# Patient Record
Sex: Female | Born: 1950
Health system: Southern US, Community
[De-identification: ages and names within clinical notes are randomized; demographics above are authoritative.]

## PROBLEM LIST (undated history)

## (undated) DIAGNOSIS — Z9889 Other specified postprocedural states: Secondary | ICD-10-CM

## (undated) DIAGNOSIS — T7840XA Allergy, unspecified, initial encounter: Secondary | ICD-10-CM

## (undated) DIAGNOSIS — E669 Obesity, unspecified: Secondary | ICD-10-CM

## (undated) DIAGNOSIS — S0300XA Dislocation of jaw, unspecified side, initial encounter: Secondary | ICD-10-CM

## (undated) DIAGNOSIS — K219 Gastro-esophageal reflux disease without esophagitis: Secondary | ICD-10-CM

## (undated) DIAGNOSIS — M1990 Unspecified osteoarthritis, unspecified site: Secondary | ICD-10-CM

## (undated) DIAGNOSIS — E785 Hyperlipidemia, unspecified: Secondary | ICD-10-CM

## (undated) DIAGNOSIS — M549 Dorsalgia, unspecified: Secondary | ICD-10-CM

## (undated) DIAGNOSIS — R51 Headache: Secondary | ICD-10-CM

## (undated) DIAGNOSIS — K635 Polyp of colon: Secondary | ICD-10-CM

## (undated) DIAGNOSIS — E119 Type 2 diabetes mellitus without complications: Secondary | ICD-10-CM

## (undated) HISTORY — DX: Obesity, unspecified: E66.9

## (undated) HISTORY — DX: Type 2 diabetes mellitus without complications: E11.9

## (undated) HISTORY — DX: Unspecified osteoarthritis, unspecified site: M19.90

## (undated) HISTORY — DX: Gastro-esophageal reflux disease without esophagitis: K21.9

## (undated) HISTORY — PX: TUBAL LIGATION: SHX77

## (undated) HISTORY — DX: Dorsalgia, unspecified: M54.9

## (undated) HISTORY — DX: Hyperlipidemia, unspecified: E78.5

## (undated) HISTORY — DX: Allergy, unspecified, initial encounter: T78.40XA

## (undated) HISTORY — DX: Other specified postprocedural states: Z98.890

## (undated) HISTORY — PX: ABDOMINAL HYSTERECTOMY: SHX81

---
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
## 1983-12-26 HISTORY — PX: TOTAL ABDOMINAL HYSTERECTOMY: SHX209

## 1986-12-25 HISTORY — PX: OTHER SURGICAL HISTORY: SHX169

## 1998-06-17 ENCOUNTER — Ambulatory Visit (HOSPITAL_COMMUNITY): Admission: RE | Admit: 1998-06-17 | Discharge: 1998-06-17 | Payer: Self-pay | Admitting: *Deleted

## 1998-10-11 ENCOUNTER — Other Ambulatory Visit: Admission: RE | Admit: 1998-10-11 | Discharge: 1998-10-11 | Payer: Self-pay | Admitting: *Deleted

## 1999-01-06 ENCOUNTER — Encounter: Admission: RE | Admit: 1999-01-06 | Discharge: 1999-01-25 | Payer: Self-pay | Admitting: Family Medicine

## 1999-05-02 ENCOUNTER — Encounter: Admission: RE | Admit: 1999-05-02 | Discharge: 1999-07-31 | Payer: Self-pay | Admitting: Family Medicine

## 2000-03-26 ENCOUNTER — Other Ambulatory Visit: Admission: RE | Admit: 2000-03-26 | Discharge: 2000-03-26 | Payer: Self-pay | Admitting: Family Medicine

## 2000-10-25 ENCOUNTER — Encounter: Payer: Self-pay | Admitting: Family Medicine

## 2000-10-25 ENCOUNTER — Ambulatory Visit: Admission: RE | Admit: 2000-10-25 | Discharge: 2000-10-25 | Payer: Self-pay | Admitting: Family Medicine

## 2001-08-09 ENCOUNTER — Other Ambulatory Visit: Admission: RE | Admit: 2001-08-09 | Discharge: 2001-08-09 | Payer: Self-pay | Admitting: Family Medicine

## 2003-12-31 ENCOUNTER — Other Ambulatory Visit: Admission: RE | Admit: 2003-12-31 | Discharge: 2003-12-31 | Payer: Self-pay | Admitting: Family Medicine

## 2004-02-29 ENCOUNTER — Emergency Department (HOSPITAL_COMMUNITY): Admission: EM | Admit: 2004-02-29 | Discharge: 2004-02-29 | Payer: Self-pay | Admitting: Emergency Medicine

## 2004-07-05 ENCOUNTER — Ambulatory Visit (HOSPITAL_COMMUNITY): Admission: RE | Admit: 2004-07-05 | Discharge: 2004-07-05 | Payer: Self-pay | Admitting: Family Medicine

## 2004-07-19 ENCOUNTER — Ambulatory Visit (HOSPITAL_COMMUNITY): Admission: RE | Admit: 2004-07-19 | Discharge: 2004-07-19 | Payer: Self-pay | Admitting: Family Medicine

## 2004-07-21 ENCOUNTER — Ambulatory Visit (HOSPITAL_COMMUNITY): Admission: RE | Admit: 2004-07-21 | Discharge: 2004-07-21 | Payer: Self-pay | Admitting: Internal Medicine

## 2004-09-14 ENCOUNTER — Emergency Department (HOSPITAL_COMMUNITY): Admission: EM | Admit: 2004-09-14 | Discharge: 2004-09-14 | Payer: Self-pay | Admitting: *Deleted

## 2004-10-06 ENCOUNTER — Ambulatory Visit (HOSPITAL_COMMUNITY): Admission: RE | Admit: 2004-10-06 | Discharge: 2004-10-06 | Payer: Self-pay | Admitting: Family Medicine

## 2005-01-04 ENCOUNTER — Ambulatory Visit: Payer: Self-pay | Admitting: Family Medicine

## 2005-07-20 ENCOUNTER — Ambulatory Visit (HOSPITAL_COMMUNITY): Admission: RE | Admit: 2005-07-20 | Discharge: 2005-07-20 | Payer: Self-pay | Admitting: Family Medicine

## 2005-08-13 ENCOUNTER — Emergency Department: Payer: Self-pay | Admitting: Internal Medicine

## 2005-11-15 ENCOUNTER — Ambulatory Visit: Payer: Self-pay | Admitting: Family Medicine

## 2005-11-21 ENCOUNTER — Ambulatory Visit (HOSPITAL_COMMUNITY): Admission: RE | Admit: 2005-11-21 | Discharge: 2005-11-21 | Payer: Self-pay | Admitting: Family Medicine

## 2005-12-27 ENCOUNTER — Ambulatory Visit: Payer: Self-pay | Admitting: Family Medicine

## 2006-07-18 ENCOUNTER — Ambulatory Visit: Payer: Self-pay | Admitting: Family Medicine

## 2006-07-24 ENCOUNTER — Ambulatory Visit (HOSPITAL_COMMUNITY): Admission: RE | Admit: 2006-07-24 | Discharge: 2006-07-24 | Payer: Self-pay | Admitting: Family Medicine

## 2006-08-02 ENCOUNTER — Ambulatory Visit (HOSPITAL_COMMUNITY): Admission: RE | Admit: 2006-08-02 | Discharge: 2006-08-02 | Payer: Self-pay | Admitting: *Deleted

## 2006-09-24 DIAGNOSIS — Z9889 Other specified postprocedural states: Secondary | ICD-10-CM

## 2006-09-24 HISTORY — DX: Other specified postprocedural states: Z98.890

## 2006-10-11 ENCOUNTER — Ambulatory Visit: Payer: Self-pay | Admitting: Internal Medicine

## 2006-10-11 ENCOUNTER — Encounter (INDEPENDENT_AMBULATORY_CARE_PROVIDER_SITE_OTHER): Payer: Self-pay | Admitting: *Deleted

## 2006-10-11 ENCOUNTER — Ambulatory Visit (HOSPITAL_COMMUNITY): Admission: RE | Admit: 2006-10-11 | Discharge: 2006-10-11 | Payer: Self-pay | Admitting: Internal Medicine

## 2006-10-11 LAB — HM COLONOSCOPY: HM Colonoscopy: ABNORMAL

## 2006-11-01 ENCOUNTER — Ambulatory Visit: Payer: Self-pay | Admitting: Family Medicine

## 2007-03-04 ENCOUNTER — Ambulatory Visit: Payer: Self-pay | Admitting: Family Medicine

## 2007-03-07 ENCOUNTER — Encounter: Payer: Self-pay | Admitting: Family Medicine

## 2007-03-07 ENCOUNTER — Ambulatory Visit (HOSPITAL_COMMUNITY): Admission: RE | Admit: 2007-03-07 | Discharge: 2007-03-07 | Payer: Self-pay | Admitting: Family Medicine

## 2007-03-07 LAB — CONVERTED CEMR LAB
LDL Cholesterol: 118 mg/dL — ABNORMAL HIGH (ref 0–99)
Total CHOL/HDL Ratio: 3.5
VLDL: 17 mg/dL (ref 0–40)

## 2007-07-26 ENCOUNTER — Encounter: Payer: Self-pay | Admitting: Family Medicine

## 2007-07-26 ENCOUNTER — Ambulatory Visit (HOSPITAL_COMMUNITY): Admission: RE | Admit: 2007-07-26 | Discharge: 2007-07-26 | Payer: Self-pay | Admitting: Family Medicine

## 2007-07-26 LAB — CONVERTED CEMR LAB
BUN: 18 mg/dL (ref 6–23)
Basophils Absolute: 0 10*3/uL (ref 0.0–0.1)
CO2: 26 meq/L (ref 19–32)
Calcium: 9.4 mg/dL (ref 8.4–10.5)
Chloride: 105 meq/L (ref 96–112)
Creatinine, Ser: 0.86 mg/dL (ref 0.40–1.20)
Eosinophils Relative: 3 % (ref 0–5)
Glucose, Bld: 84 mg/dL (ref 70–99)
HCT: 38.6 % (ref 36.0–46.0)
HDL: 57 mg/dL (ref >39)
LDL Cholesterol: 145 mg/dL — ABNORMAL HIGH (ref 0–99)
MCHC: 30.8 g/dL (ref 30.0–36.0)
Neutrophils Relative %: 49 % (ref 43–77)
Platelets: 244 10*3/uL (ref 150–400)
Potassium: 4.5 meq/L (ref 3.5–5.3)
TSH: 1.171 microintl units/mL (ref 0.350–5.50)
Total CHOL/HDL Ratio: 3.9
Triglycerides: 112 mg/dL (ref <150)
VLDL: 22 mg/dL (ref 0–40)

## 2007-07-29 ENCOUNTER — Encounter: Payer: Self-pay | Admitting: Family Medicine

## 2007-07-29 LAB — CONVERTED CEMR LAB
Indirect Bilirubin: 0.5 mg/dL (ref 0.0–0.9)
Total Bilirubin: 0.6 mg/dL (ref 0.3–1.2)

## 2007-07-31 ENCOUNTER — Ambulatory Visit: Payer: Self-pay | Admitting: Family Medicine

## 2007-07-31 ENCOUNTER — Other Ambulatory Visit: Admission: RE | Admit: 2007-07-31 | Discharge: 2007-07-31 | Payer: Self-pay | Admitting: Family Medicine

## 2007-07-31 ENCOUNTER — Encounter: Payer: Self-pay | Admitting: Family Medicine

## 2007-07-31 LAB — CONVERTED CEMR LAB
Ferritin: 227 ng/mL (ref 10–291)
UIBC: 272 ug/dL
Vitamin B-12: 442 pg/mL (ref 211–911)

## 2007-10-02 ENCOUNTER — Ambulatory Visit: Payer: Self-pay | Admitting: Family Medicine

## 2007-10-22 ENCOUNTER — Ambulatory Visit (HOSPITAL_COMMUNITY): Admission: RE | Admit: 2007-10-22 | Discharge: 2007-10-22 | Payer: Self-pay | Admitting: Family Medicine

## 2007-10-30 ENCOUNTER — Ambulatory Visit: Payer: Self-pay | Admitting: Family Medicine

## 2007-12-26 ENCOUNTER — Encounter: Payer: Self-pay | Admitting: Family Medicine

## 2008-01-30 ENCOUNTER — Ambulatory Visit: Payer: Self-pay | Admitting: Family Medicine

## 2008-02-13 ENCOUNTER — Encounter: Payer: Self-pay | Admitting: Family Medicine

## 2008-02-13 LAB — CONVERTED CEMR LAB
ALT: 19 units/L (ref 0–35)
AST: 18 units/L (ref 0–37)
Albumin: 4.2 g/dL (ref 3.5–5.2)
Glucose, Bld: 81 mg/dL (ref 70–99)
HDL: 56 mg/dL (ref >39)
LDL Cholesterol: 152 mg/dL — ABNORMAL HIGH (ref 0–99)
Total CHOL/HDL Ratio: 4.2
Total Protein: 7.6 g/dL (ref 6.0–8.3)
VLDL: 25 mg/dL (ref 0–40)

## 2008-05-13 ENCOUNTER — Ambulatory Visit: Payer: Self-pay | Admitting: Family Medicine

## 2008-05-19 ENCOUNTER — Encounter: Payer: Self-pay | Admitting: Family Medicine

## 2008-05-19 DIAGNOSIS — E785 Hyperlipidemia, unspecified: Secondary | ICD-10-CM

## 2008-05-19 DIAGNOSIS — E669 Obesity, unspecified: Secondary | ICD-10-CM | POA: Insufficient documentation

## 2008-05-19 DIAGNOSIS — K219 Gastro-esophageal reflux disease without esophagitis: Secondary | ICD-10-CM | POA: Insufficient documentation

## 2008-05-19 DIAGNOSIS — E1169 Type 2 diabetes mellitus with other specified complication: Secondary | ICD-10-CM | POA: Insufficient documentation

## 2008-06-05 ENCOUNTER — Encounter: Payer: Self-pay | Admitting: Family Medicine

## 2008-06-05 LAB — CONVERTED CEMR LAB: Retic Ct Pct: 1.1 % (ref 0.4–3.1)

## 2008-08-13 ENCOUNTER — Ambulatory Visit (HOSPITAL_COMMUNITY): Admission: RE | Admit: 2008-08-13 | Discharge: 2008-08-13 | Payer: Self-pay | Admitting: Family Medicine

## 2008-09-02 ENCOUNTER — Ambulatory Visit: Payer: Self-pay | Admitting: Family Medicine

## 2008-09-02 DIAGNOSIS — J301 Allergic rhinitis due to pollen: Secondary | ICD-10-CM | POA: Insufficient documentation

## 2008-09-11 ENCOUNTER — Encounter: Payer: Self-pay | Admitting: Family Medicine

## 2008-09-24 LAB — CONVERTED CEMR LAB
ALT: 15 units/L (ref 0–35)
AST: 17 units/L (ref 0–37)
Albumin: 4.1 g/dL (ref 3.5–5.2)
BUN: 15 mg/dL (ref 6–23)
Bilirubin, Direct: 0.1 mg/dL (ref 0.0–0.3)
CO2: 26 meq/L (ref 19–32)
HDL: 60 mg/dL (ref >39)
Hemoglobin: 11.8 g/dL — ABNORMAL LOW (ref 12.0–15.0)
MCV: 74.6 fL — ABNORMAL LOW (ref 78.0–100.0)
Platelets: 289 10*3/uL (ref 150–400)
Potassium: 4.4 meq/L (ref 3.5–5.3)
Sodium: 142 meq/L (ref 135–145)
TSH: 1.414 microintl units/mL (ref 0.350–4.50)
Total Bilirubin: 0.7 mg/dL (ref 0.3–1.2)
Total Protein: 7.2 g/dL (ref 6.0–8.3)
Triglycerides: 65 mg/dL (ref <150)

## 2008-12-02 ENCOUNTER — Other Ambulatory Visit: Admission: RE | Admit: 2008-12-02 | Discharge: 2008-12-02 | Payer: Self-pay | Admitting: Family Medicine

## 2008-12-02 ENCOUNTER — Ambulatory Visit: Payer: Self-pay | Admitting: Family Medicine

## 2008-12-02 ENCOUNTER — Encounter: Payer: Self-pay | Admitting: Family Medicine

## 2008-12-02 DIAGNOSIS — R5383 Other fatigue: Secondary | ICD-10-CM

## 2008-12-02 DIAGNOSIS — R5381 Other malaise: Secondary | ICD-10-CM | POA: Insufficient documentation

## 2008-12-02 DIAGNOSIS — M79609 Pain in unspecified limb: Secondary | ICD-10-CM | POA: Insufficient documentation

## 2008-12-02 DIAGNOSIS — M109 Gout, unspecified: Secondary | ICD-10-CM | POA: Insufficient documentation

## 2008-12-02 LAB — CONVERTED CEMR LAB: OCCULT 1: NEGATIVE

## 2008-12-03 ENCOUNTER — Telehealth: Payer: Self-pay | Admitting: Family Medicine

## 2008-12-03 LAB — CONVERTED CEMR LAB
Hemoglobin: 11.9 g/dL — ABNORMAL LOW (ref 12.0–15.0)
MCHC: 30.5 g/dL (ref 30.0–36.0)
RDW: 15.3 % (ref 11.5–15.5)
Uric Acid, Serum: 5.5 mg/dL (ref 2.4–7.0)
WBC: 10 10*3/uL (ref 4.0–10.5)

## 2008-12-04 ENCOUNTER — Telehealth: Payer: Self-pay | Admitting: Family Medicine

## 2009-01-08 ENCOUNTER — Encounter: Payer: Self-pay | Admitting: Family Medicine

## 2009-01-08 LAB — CONVERTED CEMR LAB
ALT: 16 units/L (ref 0–35)
Albumin: 4 g/dL (ref 3.5–5.2)
Bilirubin, Direct: 0.1 mg/dL (ref 0.0–0.3)
Cholesterol: 183 mg/dL (ref 0–200)
LDL Cholesterol: 107 mg/dL — ABNORMAL HIGH (ref 0–99)
Total Bilirubin: 0.6 mg/dL (ref 0.3–1.2)
Total Protein: 7 g/dL (ref 6.0–8.3)
VLDL: 16 mg/dL (ref 0–40)

## 2009-04-14 ENCOUNTER — Ambulatory Visit: Payer: Self-pay | Admitting: Family Medicine

## 2009-06-09 LAB — CONVERTED CEMR LAB
AST: 17 units/L (ref 0–37)
Albumin: 4.1 g/dL (ref 3.5–5.2)
Indirect Bilirubin: 0.4 mg/dL (ref 0.0–0.9)
LDL Cholesterol: 129 mg/dL — ABNORMAL HIGH (ref 0–99)
Total Bilirubin: 0.5 mg/dL (ref 0.3–1.2)
Triglycerides: 91 mg/dL (ref <150)
VLDL: 18 mg/dL (ref 0–40)

## 2009-06-11 ENCOUNTER — Encounter: Payer: Self-pay | Admitting: Family Medicine

## 2009-09-08 ENCOUNTER — Telehealth: Payer: Self-pay | Admitting: Family Medicine

## 2009-09-09 ENCOUNTER — Telehealth: Payer: Self-pay | Admitting: Family Medicine

## 2009-09-09 ENCOUNTER — Ambulatory Visit: Payer: Self-pay | Admitting: Family Medicine

## 2009-09-09 DIAGNOSIS — J0100 Acute maxillary sinusitis, unspecified: Secondary | ICD-10-CM | POA: Insufficient documentation

## 2009-09-19 DIAGNOSIS — J209 Acute bronchitis, unspecified: Secondary | ICD-10-CM | POA: Insufficient documentation

## 2009-10-19 ENCOUNTER — Ambulatory Visit (HOSPITAL_COMMUNITY): Admission: RE | Admit: 2009-10-19 | Discharge: 2009-10-19 | Payer: Self-pay | Admitting: Family Medicine

## 2009-10-26 ENCOUNTER — Encounter: Payer: Self-pay | Admitting: Family Medicine

## 2009-11-01 LAB — CONVERTED CEMR LAB
ALT: 14 units/L (ref 0–35)
AST: 13 units/L (ref 0–37)
Albumin: 4 g/dL (ref 3.5–5.2)
BUN: 20 mg/dL (ref 6–23)
Basophils Absolute: 0 10*3/uL (ref 0.0–0.1)
Basophils Relative: 0 % (ref 0–1)
Bilirubin, Direct: 0.1 mg/dL (ref 0.0–0.3)
Calcium: 9.3 mg/dL (ref 8.4–10.5)
Creatinine, Ser: 0.82 mg/dL (ref 0.40–1.20)
Eosinophils Absolute: 0.2 10*3/uL (ref 0.0–0.7)
HCT: 37.3 % (ref 36.0–46.0)
HDL: 63 mg/dL (ref >39)
Indirect Bilirubin: 0.3 mg/dL (ref 0.0–0.9)
LDL Cholesterol: 138 mg/dL — ABNORMAL HIGH (ref 0–99)
Lymphocytes Relative: 36 % (ref 12–46)
Monocytes Absolute: 0.5 10*3/uL (ref 0.1–1.0)
Neutro Abs: 5 10*3/uL (ref 1.7–7.7)
Neutrophils Relative %: 57 % (ref 43–77)
RDW: 16 % — ABNORMAL HIGH (ref 11.5–15.5)
Triglycerides: 80 mg/dL (ref <150)
VLDL: 16 mg/dL (ref 0–40)

## 2009-11-02 ENCOUNTER — Telehealth: Payer: Self-pay | Admitting: Family Medicine

## 2010-01-06 ENCOUNTER — Other Ambulatory Visit: Admission: RE | Admit: 2010-01-06 | Discharge: 2010-01-06 | Payer: Self-pay | Admitting: Family Medicine

## 2010-01-06 ENCOUNTER — Ambulatory Visit: Payer: Self-pay | Admitting: Family Medicine

## 2010-01-06 LAB — CONVERTED CEMR LAB: OCCULT 1: NEGATIVE

## 2010-01-31 ENCOUNTER — Encounter: Payer: Self-pay | Admitting: Family Medicine

## 2010-05-16 ENCOUNTER — Ambulatory Visit: Payer: Self-pay | Admitting: Family Medicine

## 2010-08-03 ENCOUNTER — Telehealth: Payer: Self-pay | Admitting: Family Medicine

## 2010-09-22 ENCOUNTER — Encounter: Payer: Self-pay | Admitting: Family Medicine

## 2010-09-27 ENCOUNTER — Ambulatory Visit: Payer: Self-pay | Admitting: Family Medicine

## 2010-09-27 DIAGNOSIS — R1319 Other dysphagia: Secondary | ICD-10-CM | POA: Insufficient documentation

## 2010-09-27 DIAGNOSIS — E049 Nontoxic goiter, unspecified: Secondary | ICD-10-CM | POA: Insufficient documentation

## 2010-09-27 LAB — CONVERTED CEMR LAB
Basophils Absolute: 0.1 10*3/uL (ref 0.0–0.1)
Cholesterol: 220 mg/dL — ABNORMAL HIGH (ref 0–200)
Eosinophils Relative: 3 % (ref 0–5)
Hemoglobin: 11.5 g/dL — ABNORMAL LOW (ref 12.0–15.0)
LDL Cholesterol: 139 mg/dL — ABNORMAL HIGH (ref 0–99)
Monocytes Absolute: 0.5 10*3/uL (ref 0.1–1.0)
Monocytes Relative: 6 % (ref 3–12)
Neutrophils Relative %: 53 % (ref 43–77)
RDW: 15.5 % (ref 11.5–15.5)
Total CHOL/HDL Ratio: 3.7
Triglycerides: 103 mg/dL (ref <150)
VLDL: 21 mg/dL (ref 0–40)
WBC: 9.2 10*3/uL (ref 4.0–10.5)

## 2010-09-28 ENCOUNTER — Encounter: Payer: Self-pay | Admitting: Family Medicine

## 2010-09-29 ENCOUNTER — Encounter: Payer: Self-pay | Admitting: Family Medicine

## 2010-10-05 LAB — CONVERTED CEMR LAB
BUN: 16 mg/dL (ref 6–23)
Calcium: 9.6 mg/dL (ref 8.4–10.5)
Chloride: 101 meq/L (ref 96–112)
Glucose, Bld: 81 mg/dL (ref 70–99)
Potassium: 4.8 meq/L (ref 3.5–5.3)

## 2010-10-07 ENCOUNTER — Ambulatory Visit (HOSPITAL_COMMUNITY): Admission: RE | Admit: 2010-10-07 | Discharge: 2010-10-07 | Payer: Self-pay | Admitting: Family Medicine

## 2010-10-17 ENCOUNTER — Encounter: Payer: Self-pay | Admitting: Family Medicine

## 2010-10-21 ENCOUNTER — Ambulatory Visit (HOSPITAL_COMMUNITY): Admission: RE | Admit: 2010-10-21 | Discharge: 2010-10-21 | Payer: Self-pay | Admitting: Family Medicine

## 2010-10-28 ENCOUNTER — Encounter: Payer: Self-pay | Admitting: Family Medicine

## 2010-11-02 ENCOUNTER — Encounter
Admission: RE | Admit: 2010-11-02 | Discharge: 2010-11-02 | Payer: Self-pay | Source: Home / Self Care | Admitting: Family Medicine

## 2011-01-06 ENCOUNTER — Ambulatory Visit (HOSPITAL_COMMUNITY)
Admission: RE | Admit: 2011-01-06 | Discharge: 2011-01-06 | Payer: Self-pay | Source: Home / Self Care | Attending: Family Medicine | Admitting: Family Medicine

## 2011-01-06 ENCOUNTER — Encounter: Payer: Self-pay | Admitting: Family Medicine

## 2011-01-06 ENCOUNTER — Ambulatory Visit
Admission: RE | Admit: 2011-01-06 | Discharge: 2011-01-06 | Payer: Self-pay | Source: Home / Self Care | Attending: Family Medicine | Admitting: Family Medicine

## 2011-01-06 DIAGNOSIS — N3000 Acute cystitis without hematuria: Secondary | ICD-10-CM | POA: Insufficient documentation

## 2011-01-06 DIAGNOSIS — E1169 Type 2 diabetes mellitus with other specified complication: Secondary | ICD-10-CM | POA: Insufficient documentation

## 2011-01-06 DIAGNOSIS — M542 Cervicalgia: Secondary | ICD-10-CM | POA: Insufficient documentation

## 2011-01-06 DIAGNOSIS — M1710 Unilateral primary osteoarthritis, unspecified knee: Secondary | ICD-10-CM | POA: Insufficient documentation

## 2011-01-06 DIAGNOSIS — M1990 Unspecified osteoarthritis, unspecified site: Secondary | ICD-10-CM | POA: Insufficient documentation

## 2011-01-06 DIAGNOSIS — R109 Unspecified abdominal pain: Secondary | ICD-10-CM | POA: Insufficient documentation

## 2011-01-06 LAB — CONVERTED CEMR LAB
Ketones, urine, test strip: NEGATIVE
Specific Gravity, Urine: 1.02
Urobilinogen, UA: 0.2
WBC Urine, dipstick: NEGATIVE
pH: 6

## 2011-01-09 ENCOUNTER — Ambulatory Visit: Admit: 2011-01-09 | Payer: Self-pay | Admitting: Family Medicine

## 2011-01-09 LAB — CONVERTED CEMR LAB
Basophils Absolute: 0 10*3/uL (ref 0.0–0.1)
Basophils Relative: 1 % (ref 0–1)
Chloride: 103 meq/L (ref 96–112)
Cholesterol: 218 mg/dL — ABNORMAL HIGH (ref 0–200)
Creatinine, Ser: 0.76 mg/dL (ref 0.40–1.20)
Eosinophils Absolute: 0.2 10*3/uL (ref 0.0–0.7)
Eosinophils Relative: 3 % (ref 0–5)
HDL: 60 mg/dL (ref >39)
Lymphocytes Relative: 43 % (ref 12–46)
RDW: 15.5 % (ref 11.5–15.5)
Sodium: 140 meq/L (ref 135–145)
Triglycerides: 100 mg/dL (ref <150)
WBC: 7 10*3/uL (ref 4.0–10.5)

## 2011-01-12 ENCOUNTER — Telehealth (INDEPENDENT_AMBULATORY_CARE_PROVIDER_SITE_OTHER): Payer: Self-pay | Admitting: *Deleted

## 2011-01-12 ENCOUNTER — Ambulatory Visit (HOSPITAL_COMMUNITY)
Admission: RE | Admit: 2011-01-12 | Discharge: 2011-01-12 | Payer: Self-pay | Source: Home / Self Care | Attending: Family Medicine | Admitting: Family Medicine

## 2011-01-13 ENCOUNTER — Encounter: Payer: Self-pay | Admitting: Family Medicine

## 2011-01-14 ENCOUNTER — Encounter: Payer: Self-pay | Admitting: Family Medicine

## 2011-01-16 ENCOUNTER — Ambulatory Visit (HOSPITAL_COMMUNITY)
Admission: RE | Admit: 2011-01-16 | Discharge: 2011-01-16 | Payer: Self-pay | Source: Home / Self Care | Attending: Family Medicine | Admitting: Family Medicine

## 2011-01-17 ENCOUNTER — Encounter: Payer: Self-pay | Admitting: Family Medicine

## 2011-01-23 ENCOUNTER — Encounter: Payer: Self-pay | Admitting: Family Medicine

## 2011-01-24 ENCOUNTER — Encounter: Payer: Self-pay | Admitting: Family Medicine

## 2011-01-25 ENCOUNTER — Encounter: Payer: Self-pay | Admitting: Family Medicine

## 2011-01-26 NOTE — Letter (Signed)
Summary: Letter  Letter   Imported By: Lind Guest 02/03/2010 13:38:52  _____________________________________________________________________  External Attachment:    Type:   Image     Comment:   External Document

## 2011-01-26 NOTE — Letter (Signed)
Summary: handicapp card  handicapp card   Imported By: Lind Guest 01/06/2011 11:46:16  _____________________________________________________________________  External Attachment:    Type:   Image     Comment:   External Document

## 2011-01-26 NOTE — Assessment & Plan Note (Signed)
Summary: follow up   Vital Signs:  Patient profile:   60 year old female Menstrual status:  hysterectomy Height:      64 inches Weight:      179.25 pounds BMI:     30.88 O2 Sat:      98 % on Room air Pulse rate:   98 / minute Pulse rhythm:   regular Resp:     16 per minute BP sitting:   120 / 80  (left arm)  Vitals Entered By: Adella Hare LPN (September 27, 2010 2:27 PM)  Nutrition Counseling: Patient's BMI is greater than 25 and therefore counseled on weight management options.  O2 Flow:  Room air CC: follow-up visit Is Patient Diabetic? No Pain Assessment Patient in pain? no        Primary Care Provider:  Syliva Overman MD  CC:  follow-up visit.  History of Present Illness: Reports  that she had been oing well, up until the past week, when she developed symptoms suggestive of something "stuck in her throat"Denies diffivulty breathing, states her swallowing is affected. She denies gERD symptoms but has a h/o gERD She also reports a cyst on her thyroid in the past. She is here to review recent labs also. She is not and does not intend to take lipid lowering med,she states she is motivated to change her diet to improve lipids and weight. Denies recent fever or chills. Denies sinus pressure, nasal congestion , ear pain or sore throat. Denies chest congestion, or cough productive of sputum. Denies chest pain, palpitations, PND, orthopnea or leg swelling. Denies abdominal pain, nausea, vomitting, diarrhea or constipation. Denies change in bowel movements or bloody stool. Denies dysuria , frequency, incontinence or hesitancy. Denies  joint pain, swelling, or reduced mobility. Denies headaches, vertigo, seizures. Denies depression, anxiety or insomnia. Denies  rash, lesions, or itch.     Current Medications (verified): 1)  None  Allergies (verified): 1)  ! * Vitamin D 28413 Units  Review of Systems      See HPI Eyes:  Denies blurring and discharge. ENT:   Complains of difficulty swallowing. Psych:  Denies anxiety and depression. Endo:  Denies cold intolerance, excessive thirst, excessive urination, and heat intolerance. Heme:  Denies abnormal bruising and bleeding. Allergy:  Complains of seasonal allergies; controlled on current meds, .  Physical Exam  General:  Well-developed,well-nourished,in no acute distress; alert,appropriate and cooperative throughout examination HEENT: No facial asymmetry, goiter EOMI, No sinus tenderness, TM's Clear, oropharynx  pink and moist.   Chest: Clear to auscultation bilaterally.  CVS: S1, S2, No murmurs, No S3.   Abd: Soft, Nontender.  MS: Adequate ROM spine, hips, shoulders and knees.  Ext: No edema.   CNS: CN 2-12 intact, power tone and sensation normal throughout.   Skin: Intact, no visible lesions or rashes.  Psych: Good eye contact, normal affect.  Memory intact, not anxious or depressed appearing.    Impression & Recommendations:  Problem # 1:  GOITER (ICD-240.9) Assessment Comment Only  Orders: Radiology Referral (Radiology)  Problem # 2:  OTHER DYSPHAGIA (KGM-010.27) Assessment: Deteriorated  Orders: Gastroenterology Referral (GI)  Problem # 3:  ALLERGIC RHINITIS, SEASONAL (ICD-477.0) Assessment: Unchanged  Problem # 4:  OBESITY (ICD-278.00)  Ht: 64 (09/27/2010)   Wt: 179.25 (09/27/2010)   BMI: 30.88 (09/27/2010) therapeutic lifestyle change discussed and encouraged  Problem # 5:  HYPERLIPIDEMIA (ICD-272.4) Assessment: Deteriorated  Labs Reviewed: SGOT: 13 (10/19/2009)   SGPT: 14 (10/19/2009)   HDL:60 (09/22/2010), 48 (01/26/2010)  LDL:139 (09/22/2010), 129 (01/26/2010)  Chol:220 (09/22/2010), 193 (01/26/2010)  Trig:103 (09/22/2010), 80 (01/26/2010) Low fat diet discussed and encouraged, and literature also given  Other Orders: Influenza Vaccine NON MCR (04540)  Patient Instructions: 1)  cPE jan 14 or after. 2)  It is important that you exercise regularly at least 20  minutes 5 times a week. If you develop chest pain, have severe difficulty breathing, or feel very tired , stop exercising immediately and seek medical attention. 3)  You need to lose weight. Consider a lower calorie diet and regular exercise. goal is 6 pounds. 4)  you are being referred for an [place] of your thyroid gland ,and also to see Dr Renae Fickle 5)  pls follow low fat and low carb diet.    Influenza Vaccine    Vaccine Type: Fluvax Non-MCR    Site: left deltoid    Mfr: [email or web address]    Dose: 0.5 ml    Route: IM    Given by: Adella Hare LPN    Exp. Date: 05/212    Lot #: [id number]    VIS given: 07/19/10 version given September 27, 2010.

## 2011-01-26 NOTE — Assessment & Plan Note (Signed)
Summary: cough   Vital Signs:  Patient profile:   60 year old female Menstrual status:  hysterectomy Height:      64 inches Weight:      179 pounds BMI:     30.84 O2 Sat:      99 % Pulse rate:   70 / minute Pulse rhythm:   regular Resp:     16 per minute BP sitting:   112 / 80  (left arm)  Vitals Entered By: Everitt Amber LPN (January 06, 2011 10:56 AM)  Nutrition Counseling: Patient's BMI is greater than 25 and therefore counseled on weight management options. CC: has a pain that started in her right side that works its way to the front, hurts very bad when it hits, going on off and on for a few weeks, Also has a cough thats producing green phlegm   Primary Care Provider:  Syliva Overman MD  CC:  has a pain that started in her right side that works its way to the front, hurts very bad when it hits, going on off and on for a few weeks, and Also has a cough thats producing green phlegm.  History of Present Illness: c/o left loin to groin intermittently x 3 weeks. C/o cough productive of green sputum, chills and fatigue.and sinus pressure wih green drainage. C/O neck pain radiaitng to RUE , with numness and weakness progressing, present for about 1 yr. Has established disc diseasse in lower back for which she is requesting renewed handicap sticker. c/o weight gain, wants adipex back  Current Medications (verified): 1)  Flonase 50 Mcg/act Susp (Fluticasone Propionate) .... 2 Sprays in Each Nostril Daily  Allergies (verified): 1)  ! * Vitamin D 21308 Units  Review of Systems      See HPI General:  Complains of chills, fatigue, and fever. Eyes:  Denies blurring and double vision. ENT:  Complains of sinus pressure; bilateral  maxillary pressure with green drainage anterior and posterior, chills intermittent, no doc fever x 1 week, dry coucough. CV:  Denies chest pain or discomfort, palpitations, and swelling of feet. Resp:  Complains of cough and sputum productive. GI:   Complains of abdominal pain; right flank pain iontermittently x 2 weeksnot aggravated by movement, at times up to a 10, radiates t groin. GU:  Denies dysuria and urinary frequency. MS:  Complains of joint pain and stiffness; bilateral knee pain and instability x 2 months, right is currently more painful and swollen, hiowever the left uckled approx 3 weeks ago and she actually fell and bruised the left hand.. Neuro:  Complains of numbness, tingling, and weakness; intermittent weakness, tinglung and numbness of RUE from neck to  wrist x 6 months, progressively worsenessing, c/o neck tightness,No other neurologic symptoms.  Physical Exam  General:  Well-developed,well-nourished,in no acute distress; alert,appropriate and cooperative throughout examination. Ill appearing HEENT: No facial asymmetry,  EOMI, maxillarysinus tenderness, TM's Clear, oropharynx  pink and moist. right trapezius spasm  Chest: decreased air entry with bilateral crackles, no wheezes CVS: S1, S2, No murmurs, No S3.   Abd: Soft, Nontender.  MV:HQIONGEXB  ROM cervical spine, hips, shoulders and knees.  Ext: No edema.   CNS: CN 2-12 intact, decreased power and sensation in right upper extremity  Skin: Intact, no visible lesions or rashes.  Psych: Good eye contact, normal affect.  Memory intact, not anxious or depressed appearing.    Impression & Recommendations:  Problem # 1:  DEGENERATIVE JOINT DISEASE, KNEES, BILATERAL (ICD-715.96) Assessment Deteriorated  Her updated medication list for this problem includes:    Ibuprofen 800 Mg Tabs (Ibuprofen) .Marland Kitchen... Take 1 tablet by mouth three times a day for 1 week, then one daily as needed for pain    Cyclobenzaprine Hcl 10 Mg Tabs (Cyclobenzaprine hcl) .Marland Kitchen... Take 1 tab by mouth at bedtime for 1 week, then as needed for neck spasm  Future Orders: Orthopedic Referral (Ortho) ... 01/10/2011  Problem # 2:  NECK PAIN, CHRONIC (ICD-723.1) Assessment: Deteriorated  Her updated  medication list for this problem includes:    Ibuprofen 800 Mg Tabs (Ibuprofen) .Marland Kitchen... Take 1 tablet by mouth three times a day for 1 week, then one daily as needed for pain    Cyclobenzaprine Hcl 10 Mg Tabs (Cyclobenzaprine hcl) .Marland Kitchen... Take 1 tab by mouth at bedtime for 1 week, then as needed for neck spasm pt has right upper ext weakneess and numkbness and has lower back ds Orders: Radiology Referral (Radiology)  Problem # 3:  FLANK PAIN (ICD-789.09) Assessment: Comment Only  Her updated medication list for this problem includes:    Ibuprofen 800 Mg Tabs (Ibuprofen) .Marland Kitchen... Take 1 tablet by mouth three times a day for 1 week, then one daily as needed for pain    Cyclobenzaprine Hcl 10 Mg Tabs (Cyclobenzaprine hcl) .Marland Kitchen... Take 1 tab by mouth at bedtime for 1 week, then as needed for neck spasm  Orders: Radiology other (Radiology Other)concerned re possibility of kidney stones UA Dipstick W/ Micro (manual) (81191) Depo- Medrol 80mg  (J1040) Ketorolac-Toradol 15mg  (Y7829) Admin of Therapeutic Inj  intramuscular or subcutaneous (96372)Future Orders: Radiology Referral (Radiology) ... 01/12/2011  Problem # 4:  ACUTE CYSTITIS (ICD-595.0) Assessment: Comment Only  Her updated medication list for this problem includes:    Septra Ds 800-160 Mg Tabs (Sulfamethoxazole-trimethoprim) .Marland Kitchen... Take 1 tablet by mouth two times a day  Encouraged to push clear liquids, get enough rest, and take acetaminophen as needed. To be seen in 10 days if no improvement, sooner if worse. CCUA checked in the office was negative for infection or blood  Problem # 5:  ACUTE MAXILLARY SINUSITIS (ICD-461.0) Assessment: Comment Only  Her updated medication list for this problem includes:    Flonase 50 Mcg/act Susp (Fluticasone propionate) .Marland Kitchen... 2 sprays in each nostril daily    Septra Ds 800-160 Mg Tabs (Sulfamethoxazole-trimethoprim) .Marland Kitchen... Take 1 tablet by mouth two times a day  Problem # 6:  ACUTE BRONCHITIS  (ICD-466.0) Assessment: Comment Only  Her updated medication list for this problem includes:    Septra Ds 800-160 Mg Tabs (Sulfamethoxazole-trimethoprim) .Marland Kitchen... Take 1 tablet by mouth two times a day  Problem # 7:  OBESITY (ICD-278.00) Assessment: Deteriorated  Ht: 64 (01/06/2011)   Wt: 179 (01/06/2011)   BMI: 30.84 (01/06/2011) therapeutic lifestyle change discussed and encouraged  Problem # 8:  HYPERLIPIDEMIA (ICD-272.4) Assessment: Comment Only  Orders: T-Lipid Profile ([id number]) Low fat dietdiscussed and encouraged  Labs Reviewed: SGOT: 13 (10/19/2009)   SGPT: 14 (10/19/2009)   HDL:60 (09/22/2010), 48 (01/26/2010)  LDL:139 (09/22/2010), 129 (01/26/2010)  Chol:220 (09/22/2010), 193 (01/26/2010)  Trig:103 (09/22/2010), 80 (01/26/2010)  Complete Medication List: 1)  Flonase 50 Mcg/act Susp (Fluticasone propionate) .... 2 sprays in each nostril daily 2)  Septra Ds 800-160 Mg Tabs (Sulfamethoxazole-trimethoprim) .... Take 1 tablet by mouth two times a day 3)  Fluconazole 150 Mg Tabs (Fluconazole) .... Take 1 tablet by mouth once a day as needed for vaginal itching 4)  Phentermine Hcl 37.5 Mg Tabs (Phentermine hcl) .... Take  1 tablet by mouth once a day 5)  Ibuprofen 800 Mg Tabs (Ibuprofen) .... Take 1 tablet by mouth three times a day for 1 week, then one daily as needed for pain 6)  Cyclobenzaprine Hcl 10 Mg Tabs (Cyclobenzaprine hcl) .... Take 1 tab by mouth at bedtime for 1 week, then as needed for neck spasm  Other Orders: T-Basic Metabolic Panel [phone]) T-CBC w/Diff [phone]) T- Hemoglobin A1C (32440-10272)  Patient Instructions: 1)  Please schedule a follow-up appointment in 2 months. 2)  It is important that you exercise regularly at least 20 minutes 5 times a week. If you develop chest pain, have severe difficulty breathing, or feel very tired , stop exercising immediately and seek medical attention. 3)  You need to lose weight. Consider a lower calorie diet  and regular exercise.  4)  You are being trreated for sinusitis.Meds are sent in. 5)  You will  be referred to ortho, DrMurphy about your knees. 6)  you need imaging done of your kidneys.to eval flank pain. 7)  You neeed an MRI of your neck for eval of neck and right upper extremity  pain. Meds are sent in for this. 8)  phentermine is sent in as an apetite suppressant, pls take half tablet once daily, Not one daily the lowest effective doose is preferred 9)  You will be referred for an xray ofyour abdomen and an [place] of your kidneys to eval pain Prescriptions: CYCLOBENZAPRINE HCL 10 MG TABS (CYCLOBENZAPRINE HCL) Take 1 tab by mouth at bedtime for 1 week, then as needed for neck spasm  #30 x 1   Entered and Authorized by:   Syliva Overman MD   Signed by:   Syliva Overman MD on 01/06/2011   Method used:   Electronically to        [facility] (retail)       [address]       [place], [place]  [address]       Ph: [phone]       Fax: [phone]   RxID:   [id number] IBUPROFEN 800 MG TABS (IBUPROFEN) Take 1 tablet by mouth three times a day for 1 week, then one daily as needed for pain  #42 x 0   Entered and Authorized by:   Syliva Overman MD   Signed by:   Syliva Overman MD on 01/06/2011   Method used:   Electronically to        [facility] (retail)       [address]       [place], [place]  [address]       Ph: [phone]       Fax: [phone]   RxID:   [id number] PHENTERMINE HCL 37.5 MG TABS (PHENTERMINE HCL) Take 1 tablet by mouth once a day  #30 x 0   Entered and Authorized by:   Syliva Overman MD   Signed by:   Syliva Overman MD on 01/06/2011   Method used:   [facility]       [address]       [place], [place]  [address]       Ph: [phone]       Fax: [phone]   RxID:  [id number] FLUCONAZOLE 150 MG  TABS (FLUCONAZOLE) Take 1 tablet by mouth once a day as needed for vaginal itching  #3 x 0   Entered and Authorized by:   Syliva Overman MD   Signed by:   Syliva Overman MD on 01/06/2011   Method used:   Electronically to        [facility] (retail)       [address]       [place], [place]  [address]       Ph: [phone]       Fax: [phone]   RxID:   [id number] SEPTRA DS 800-160 MG TABS (SULFAMETHOXAZOLE-TRIMETHOPRIM) Take 1 tablet by mouth two times a day  #28 x 0   Entered and Authorized by:   Syliva Overman MD   Signed by:   Syliva Overman MD on 01/06/2011   Method used:   Electronically to        [facility] (retail)       [address]       [place], [place]  [address]       Ph: [phone]       Fax: [phone]   RxID:   [id number]    Medication Administration  Injection # 1:    Medication: Depo- Medrol 80mg     Diagnosis: FLANK PAIN (ICD-789.09)    Route: IM    Site: RUOQ gluteus    Exp Date: 06/2011    Lot #: [id number]    Mfr: Pharmacia    Comments: 80mg  given     Patient tolerated injection without complications    Given by: Everitt Amber LPN (January 06, 2011 11:52 AM)  Injection # 2:    Medication: Ketorolac-Toradol 15mg     Diagnosis: FLANK PAIN (ICD-789.09)    Route: IM    Site: LUOQ gluteus    Exp Date: 05/2012    Lot #: [id number]     Mfr: [email or web address]    Comments: 60mg  given     Patient tolerated injection without complications    Given by: Everitt Amber LPN (January 06, 2011 11:52 AM)  Orders Added: 1)  Est. Patient Level IV [32355] 2)  Radiology Referral [Radiology] 3)  Radiology other [Radiology Other] 4)  T-Basic Metabolic Panel [80048-22910] 5)  T-Lipid Profile [80061-22930] 6)  T-CBC w/Diff [73220-25427] 7)  T- Hemoglobin A1C [83036-23375] 8)  UA Dipstick W/ Micro (manual) [81000] 9)  Depo- Medrol 80mg  [J1040] 10)  Ketorolac-Toradol 15mg  [J1885] 11)  Admin of  Therapeutic Inj  intramuscular or subcutaneous [96372] 12)  Orthopedic Referral [Ortho] 13)  Radiology Referral [Radiology]    Laboratory Results   Urine Tests    Routine Urinalysis   Color: yellow Appearance: Clear Glucose: negative   (Normal Range: Negative) Bilirubin: negative   (Normal Range: Negative) Ketone: negative   (Normal Range: Negative) Spec. Gravity: 1.020   (Normal Range: 1.003-1.035) Blood: negative   (Normal Range: Negative) pH: 6.0   (Normal Range: 5.0-8.0) Protein: negative   (Normal Range: Negative) Urobilinogen: 0.2   (Normal Range: 0-1) Nitrite: negative   (Normal Range: Negative) Leukocyte Esterace: negative   (Normal Range: Negative)         Medication Administration  Injection # 1:    Medication: Depo- Medrol 80mg     Diagnosis: FLANK PAIN (ICD-789.09)    Route: IM    Site: RUOQ gluteus    Exp Date:  06/2011    Lot #: [id number]    Mfr: Pharmacia    Comments: 80mg  given     Patient tolerated injection without complications    Given by: Everitt Amber LPN (January 06, 2011 11:52 AM)  Injection # 2:    Medication: Ketorolac-Toradol 15mg     Diagnosis: FLANK PAIN (ICD-789.09)    Route: IM    Site: LUOQ gluteus    Exp Date: 05/2012    Lot #: [id number]     Mfr: [email or web address]    Comments: 60mg  given     Patient tolerated injection without complications    Given by: Everitt Amber LPN (January 06, 2011 11:52 AM)  Orders Added: 1)  Est. Patient Level IV [42595] 2)  Radiology Referral [Radiology] 3)  Radiology other [Radiology Other] 4)  T-Basic Metabolic Panel [80048-22910] 5)  T-Lipid Profile [80061-22930] 6)  T-CBC w/Diff [63875-64332] 7)  T- Hemoglobin A1C [83036-23375] 8)  UA Dipstick W/ Micro (manual) [81000] 9)  Depo- Medrol 80mg  [J1040] 10)  Ketorolac-Toradol 15mg  [J1885] 11)  Admin of Therapeutic Inj  intramuscular or subcutaneous [96372] 12)  Orthopedic Referral [Ortho] 13)  Radiology Referral [Radiology]

## 2011-01-26 NOTE — Letter (Signed)
Summary: Letter  Letter   Imported By: Lind Guest 09/29/2010 15:47:26  _____________________________________________________________________  External Attachment:    Type:   Image     Comment:   External Document

## 2011-01-26 NOTE — Letter (Signed)
Summary: Letter  Letter   Imported By: Lind Guest 01/17/2011 10:03:13  _____________________________________________________________________  External Attachment:    Type:   Image     Comment:   External Document

## 2011-01-26 NOTE — Progress Notes (Signed)
Summary: x-ray  Phone Note Call from Patient   Summary of Call: fell down step this morning and her ankle is swollen and she can't hardley walk.   [phone]     wants to get a x-ray Initial call taken by: Rudene Anda,  August 03, 2010 4:59 PM  Follow-up for Phone Call        returned call, no answer Follow-up by: Adella Hare LPN,  August 03, 2010 5:06 PM  Additional Follow-up for Phone Call Additional follow up Details #1::        please have patient schedule appt with PA  Additional Follow-up by: Adella Hare LPN,  August 03, 2010 5:07 PM    Additional Follow-up for Phone Call Additional follow up Details #2::    called pt and left a message for pt to call office and schedule appt with PA.  Follow-up by: Rudene Anda,  August 04, 2010 9:02 AM

## 2011-01-26 NOTE — Letter (Signed)
Summary: office notes  office notes   Imported By: Lind Guest 05/06/2010 16:20:20  _____________________________________________________________________  External Attachment:    Type:   Image     Comment:   External Document

## 2011-01-26 NOTE — Miscellaneous (Signed)
  Clinical Lists Changes  Orders: Added new Referral order of Neurosurgeon Referral (Neurosurgeon) - Signed

## 2011-01-26 NOTE — Letter (Signed)
Summary: demo  demo   Imported By: Lind Guest 05/06/2010 16:15:53  _____________________________________________________________________  External Attachment:    Type:   Image     Comment:   External Document

## 2011-01-26 NOTE — Letter (Signed)
Summary: misc  misc   Imported By: Lind Guest 05/06/2010 16:26:29  _____________________________________________________________________  External Attachment:    Type:   Image     Comment:   External Document

## 2011-01-26 NOTE — Letter (Signed)
Summary: labs  labs   Imported By: Lind Guest 05/06/2010 16:21:16  _____________________________________________________________________  External Attachment:    Type:   Image     Comment:   External Document

## 2011-01-26 NOTE — Letter (Signed)
Summary: Letter  Letter   Imported By: Lind Guest 09/28/2010 13:55:51  _____________________________________________________________________  External Attachment:    Type:   Image     Comment:   External Document

## 2011-01-26 NOTE — Letter (Signed)
Summary: lab add on   lab add on   Imported By: Luann Bullins 10/17/2010 14:12:29  _____________________________________________________________________  External Attachment:    Type:   Image     Comment:   External Document

## 2011-01-26 NOTE — Letter (Signed)
Summary: history and physical  history and physical   Imported By: Lind Guest 05/06/2010 16:16:34  _____________________________________________________________________  External Attachment:    Type:   Image     Comment:   External Document

## 2011-01-26 NOTE — Assessment & Plan Note (Signed)
Summary: office visit   Vital Signs:  Patient profile:   60 year old female Menstrual status:  hysterectomy Height:      64 inches Weight:      178 pounds BMI:     30.66 O2 Sat:      95 % Pulse rate:   78 / minute Pulse rhythm:   regular Resp:     16 per minute BP sitting:   118 / 78  (left arm) Cuff size:   large  Vitals Entered By: Everitt Amber LPN (May 16, 2010 2:51 PM)  Nutrition Counseling: Patient's BMI is greater than 25 and therefore counseled on weight management options. CC: Follow up chronic problems   Primary Care Provider:  Syliva Overman MD  CC:  Follow up chronic problems.  History of Present Illness: Ftaigue , pt stastes the formui;lation in her "bee pollen' tabs changed , and they became ineffective, she stopped losing and actually gained weight. She recently started a new tablet and is hopeful, formulartion "change" in the same bee polln. She still does not exercise. She denies depression , anxiety or insomnia. She denies fever or chills.She denies head or chest congestion.  Current Medications (verified): 1)  Aspir-Low 81 Mg Tbec (Aspirin) .... One Tab By Mouth Once Daily 2)  Flonase 50 Mcg/act Susp (Fluticasone Propionate) .... Two Puffs Per Nostril Once Daily  Allergies (verified): 1)  ! * Vitamin D 16109 Units  Review of Systems      See HPI Eyes:  Denies discharge and red eye. ENT:  Denies hoarseness, nasal congestion, sinus pressure, and sore throat. CV:  Denies chest pain or discomfort, palpitations, and swelling of feet. Resp:  Denies coughing up blood and sputum productive. GU:  Denies dysuria and urinary frequency. MS:  Denies joint pain, low back pain, mid back pain, and stiffness. Derm:  Denies itching, lesion(s), and rash. Neuro:  Denies headaches, memory loss, seizures, and tremors. Psych:  Denies anxiety and depression. Endo:  Denies excessive thirst and excessive urination. Heme:  Denies abnormal bruising and bleeding. Allergy:   Denies hives or rash and itching eyes.  Physical Exam  General:  Well-developed,well-nourished,in no acute distress; alert,appropriate and cooperative throughout examination HEENT: No facial asymmetry,  EOMI, No sinus tenderness, TM's Clear, oropharynx  pink and moist.   Chest: Clear to auscultation bilaterally.  CVS: S1, S2, No murmurs, No S3.   Abd: Soft, Nontender.  MS: Adequate ROM spine, hips, shoulders and knees.  Ext: No edema.   CNS: CN 2-12 intact, power tone and sensation normal throughout.   Skin: Intact, no visible lesions or rashes.  Psych: Good eye contact, normal affect.  Memory intact, not anxious or depressed appearing.    Impression & Recommendations:  Problem # 1:  OBESITY (ICD-278.00) Assessment Deteriorated  Ht: 64 (05/16/2010)   Wt: 178 (05/16/2010)   BMI: 30.66 (05/16/2010), encourage dpt strongly to consider phentermine instead of beepollen formulation which is not regulated  Problem # 2:  HYPERLIPIDEMIA (ICD-272.4) Assessment: Deteriorated  Labs Reviewed: SGOT: 13 (10/19/2009)   SGPT: 14 (10/19/2009)   HDL:48 (01/26/2010), 63 (10/19/2009)  LDL:129 (01/26/2010), 138 (10/19/2009)  Chol:193 (01/26/2010), 217 (10/19/2009)  Trig:80 (01/26/2010), 80 (10/19/2009), low fat diet discusssed and encouraged  Complete Medication List: 1)  Aspir-low 81 Mg Tbec (Aspirin) .... One tab by mouth once daily 2)  Flonase 50 Mcg/act Susp (Fluticasone propionate) .... Two puffs per nostril once daily  Other Orders: T-Lipid Profile ([id number]) T-CBC w/Diff [phone]) T-Vitamin D (25-Hydroxy) [id number])  Patient Instructions: 1)  Please schedule a follow-up appointment in 4 months. 2)  It is important that you exercise regularly at least 30 minutes 5 times a week. If you develop chest pain, have severe difficulty breathing, or feel very tired , stop exercising immediately and seek medical attention. 3)  You need to lose weight. Consider a lower calorie diet and  regular exercise.  4)  Vitamin D level in 4 months, and fasting lipid and CBc. 5)  Plas follow a low fat diet, 12 to `1600 cal/day. 6)  Pls start OTC ONE multivit daily. 7)  Asprin 81 mg daily for heart protection. 8)  OTC calcium 1200mg  daily caplsule, with Vitamin D1000IU one daily Prescriptions: FLONASE 50 MCG/ACT SUSP (FLUTICASONE PROPIONATE) two puffs per nostril once daily  #1 x 3   Entered by:   Everitt Amber LPN   Authorized by:   Syliva Overman MD   Signed by:   Everitt Amber LPN on 16/09/9603   Method used:   Electronically to        [facility] (retail)       [address]       [place], [place]  [address]       Ph: [phone]       Fax: [phone]   RxID:   [id number]

## 2011-01-26 NOTE — Letter (Signed)
Summary: phone notes  phone notes   Imported By: Lind Guest 05/06/2010 16:24:13  _____________________________________________________________________  External Attachment:    Type:   Image     Comment:   External Document

## 2011-01-26 NOTE — Letter (Signed)
Summary: lab add on  lab add on   Imported By: Luann Bullins 09/28/2010 11:07:52  _____________________________________________________________________  External Attachment:    Type:   Image     Comment:   External Document

## 2011-01-26 NOTE — Letter (Signed)
Summary: x rays  x rays   Imported By: Lind Guest 05/06/2010 16:23:13  _____________________________________________________________________  External Attachment:    Type:   Image     Comment:   External Document

## 2011-01-26 NOTE — Progress Notes (Signed)
Summary: referral  Phone Note Call from Patient   Summary of Call: pt came in office today and stated she had wrong time for ultra sound at [facility]. I looked in notes and told her that it was documented for 01/12/2009 10:30. Radiology stated 1:30. I called Radiology and tried to get them to work with patient and even got them to ask radiology. Radiology stated they couldn't until 12:30. And patient said she needed to go back to work. So she decided she would go this afternoon for appt.  Initial call taken by: Rudene Anda,  January 12, 2011 10:58 AM

## 2011-01-26 NOTE — Assessment & Plan Note (Signed)
Summary: physical   Vital Signs:  Patient profile:   60 year old female Menstrual status:  hysterectomy Height:      64 inches Weight:      173.25 pounds BMI:     29.85 O2 Sat:      99 % on Room air Pulse rate:   98 / minute Pulse rhythm:   regular Resp:     16 per minute BP sitting:   120 / 80  (left arm)  Vitals Entered By: Worthy Keeler LPN (January 06, 2010 2:51 PM)  Nutrition Counseling: Patient's BMI is greater than 25 and therefore counseled on weight management options.  O2 Flow:  Room air CC: physical Is Patient Diabetic? No Pain Assessment Patient in pain? no       Vision Screening:Left eye with correction: 20 / 20 Right eye with correction: 20 / 20 Both eyes with correction: 20 / 15        Vision Entered By: Worthy Keeler LPN (January 06, 2010 2:51 PM)   Primary Care Provider:  Syliva Overman MD  CC:  physical.  History of Present Illness: Reports  thatshe has been  doing well. Denies recent fever or chills. Denies sinus pressure, nasal congestion , ear pain or sore throat. Denies chest congestion, or cough productive of sputum. Denies chest pain, palpitations, PND, orthopnea or leg swelling. Denies abdominal pain, nausea, vomitting, diarrhea or constipation. Denies change in bowel movements or bloody stool. Denies dysuria , frequency, incontinence or hesitancy. Denies  joint pain, swelling, or reduced mobility. Denies headaches, vertigo, seizures. Denies depression, anxiety or insomnia. Denies  rash, lesions, or itch. The pt is still not exercising, however she recently , in the past 3 months started an oTC apetite supprssant called "Bee pollen" with veryencouraging results, she does however take 3 instead of the max recommended 2 tabs daily.States she has not felt this good in a long time, energized, very happy with her weight loss and new slimmer image.     Current Medications (verified): 1)  None  Allergies (verified): No Known Drug  Allergies  Review of Systems      See HPI General:  Complains of loss of appetite and weight loss; denies chills, fatigue, fever, malaise, sleep disorder, sweats, and weakness; intentional. Eyes:  Denies blurring, discharge, double vision, eye irritation, eye pain, halos, itching, and light sensitivity. ENT:  Denies hoarseness, nasal congestion, sinus pressure, and sore throat. CV:  Denies chest pain or discomfort, difficulty breathing while lying down, palpitations, shortness of breath with exertion, and swelling of feet. Resp:  Denies cough, sputum productive, and wheezing. GI:  Denies abdominal pain, constipation, diarrhea, nausea, and vomiting. GU:  Denies dysuria and urinary frequency. MS:  Denies joint pain, low back pain, mid back pain, and stiffness. Derm:  Denies itching, lesion(s), and rash. Neuro:  Denies headaches, poor balance, seizures, and sensation of room spinning. Psych:  Denies anxiety, depression, suicidal thoughts/plans, thoughts of violence, and unusual visions or sounds. Endo:  Denies cold intolerance, excessive hunger, excessive thirst, excessive urination, heat intolerance, polyuria, and weight change. Heme:  Denies abnormal bruising, bleeding, and enlarge lymph nodes. Allergy:  Complains of seasonal allergies; denies hives or rash and itching eyes.  Physical Exam  General:  Well-developed,well-nourished,in no acute distress; alert,appropriate and cooperative throughout examination Head:  Normocephalic and atraumatic without obvious abnormalities. No apparent alopecia or balding. Eyes:  No corneal or conjunctival inflammation noted. EOMI. Perrla. Funduscopic exam benign, without hemorrhages, exudates or papilledema. Vision grossly normal.  Ears:  External ear exam shows no significant lesions or deformities.  Otoscopic examination reveals clear canals, tympanic membranes are intact bilaterally without bulging, retraction, inflammation or discharge. Hearing is grossly  normal bilaterally. Nose:  External nasal examination shows no deformity or inflammation. Nasal mucosa are pink and moist without lesions or exudates. Mouth:  Oral mucosa and oropharynx without lesions or exudates.  Teeth in good repair. Neck:  No deformities, masses, or tenderness noted. Chest Wall:  No deformities, masses, or tenderness noted. Breasts:  No mass, nodules, thickening, tenderness, bulging, retraction, inflamation, nipple discharge or skin changes noted.   Lungs:  Normal respiratory effort, chest expands symmetrically. Lungs are clear to auscultation, no crackles or wheezes. Heart:  Normal rate and regular rhythm. S1 and S2 normal without gallop, murmur, click, rub or other extra sounds. Abdomen:  Bowel sounds positive,abdomen soft and non-tender without masses, organomegaly or hernias noted. Rectal:  No external abnormalities noted. Normal sphincter tone. No rectal masses or tenderness.Guaic negative stool Genitalia:  normal introitus, no external lesions, mucosa pink and moist, no vaginal atrophy, and no adnexal masses or tenderness.Uterus absent.   Msk:  No deformity or scoliosis noted of thoracic or lumbar spine.   Pulses:  R and L carotid,radial,femoral,dorsalis pedis and posterior tibial pulses are full and equal bilaterally Extremities:  No clubbing, cyanosis, edema, or deformity noted with normal full range of motion of all joints.   Neurologic:  No cranial nerve deficits noted. Station and gait are normal. Plantar reflexes are down-going bilaterally. DTRs are symmetrical throughout. Sensory, motor and coordinative functions appear intact. Skin:  Intact without suspicious lesions or rashes Cervical Nodes:  No lymphadenopathy noted Axillary Nodes:  No palpable lymphadenopathy Inguinal Nodes:  No significant adenopathy Psych:  Cognition and judgment appear intact. Alert and cooperative with normal attention span and concentration. No apparent delusions, illusions,  hallucinations   Impression & Recommendations:  Problem # 1:  FATIGUE (ICD-780.79) Assessment Improved  Problem # 2:  ALLERGIC RHINITIS, SEASONAL (ICD-477.0) Assessment: Unchanged  Problem # 3:  GERD (ICD-530.81) Assessment: Improved  Problem # 4:  HYPERLIPIDEMIA (ICD-272.4) Assessment: Comment Only  The following medications were removed from the medication list:    Lovastatin 40 Mg Tabs (Lovastatin) ..... One tab by mouth qhs  Orders: T-Lipid Profile [phone])  Labs Reviewed: SGOT: 13 (10/19/2009)   SGPT: 14 (10/19/2009)   HDL:63 (10/19/2009), 52 (06/04/2009)  LDL:138 (10/19/2009), 129 (06/04/2009)  Chol:217 (10/19/2009), 199 (06/04/2009)  Trig:80 (10/19/2009), 91 (06/04/2009)  Problem # 5:  OBESITY (ICD-278.00) Assessment: Improved  Ht: 64 (01/06/2010)   Wt: 173.25 (01/06/2010)   BMI: 29.85 (01/06/2010)  Other Orders: T-CBC w/Diff ([id number]) T-Anemia Panel 3  (2904) T-Vitamin D (25-Hydroxy) (29562-13086) Pap Smear (57846) Hemoccult Guaiac-1 spec.(in office) (96295)  Patient Instructions: 1)  Please schedule a follow-up appointment in 4 months. 2)  Lipid Panel prior to visit, ICD-9: 3)  Fasting blood sugarn  fasting asap 4)  Vit D level 5)  CBC and anemia panel 6)  Congrats on your weight loss, pls keep it up 7)  Exercise every day for 30 mins    Laboratory Results  Date/Time Received: January 06, 2010  Date/Time Reported: January 06, 2010   Stool - Occult Blood Hemmoccult #1: negative Date: 01/06/2010 Comments: 51180 9R 8/11 118 10/12

## 2011-01-26 NOTE — Letter (Signed)
[facility]: [facility]   Imported By: Lind Guest 10/28/2010 10:25:50  _____________________________________________________________________  External Attachment:    Type:   Image     Comment:   External Document

## 2011-02-01 NOTE — Letter (Signed)
[facility]   Imported By: Lind Guest 01/25/2011 11:31:35  _____________________________________________________________________  External Attachment:    Type:   Image     Comment:   External Document

## 2011-02-08 ENCOUNTER — Telehealth: Payer: Self-pay | Admitting: Family Medicine

## 2011-02-09 NOTE — Letter (Signed)
Summary: [facility]   Imported By: Lind Guest 01/30/2011 13:54:14  _____________________________________________________________________  External Attachment:    Type:   Image     Comment:   External Document

## 2011-02-15 NOTE — Progress Notes (Signed)
Summary: about her sister  Phone Note Call from Patient   Summary of Call: Marybel stated that Reathy did make it to her house and that she is dong all she can do for now but she will not  put her in a hospital yet. She may take her to [facility] or [place] where Dr.Bell is at. And that you could call her later at home if you want to. But this is the worse she has ever seen her. Initial call taken by: Lind Guest,  February 08, 2011 4:18 PM  Follow-up for Phone Call        noted Follow-up by: Syliva Overman MD,  February 08, 2011 5:22 PM

## 2011-03-09 ENCOUNTER — Encounter: Payer: Self-pay | Admitting: Family Medicine

## 2011-03-13 ENCOUNTER — Encounter (INDEPENDENT_AMBULATORY_CARE_PROVIDER_SITE_OTHER): Payer: BC Managed Care – PPO | Admitting: Family Medicine

## 2011-03-13 ENCOUNTER — Other Ambulatory Visit (HOSPITAL_COMMUNITY)
Admission: RE | Admit: 2011-03-13 | Discharge: 2011-03-13 | Disposition: A | Discharge status: Home / Self Care | Payer: BC Managed Care – PPO | Source: Ambulatory Visit | Attending: Family Medicine | Admitting: Family Medicine

## 2011-03-13 ENCOUNTER — Other Ambulatory Visit: Payer: Self-pay | Admitting: Family Medicine

## 2011-03-13 ENCOUNTER — Encounter: Payer: Self-pay | Admitting: Family Medicine

## 2011-03-13 DIAGNOSIS — Z124 Encounter for screening for malignant neoplasm of cervix: Secondary | ICD-10-CM

## 2011-03-13 DIAGNOSIS — Z1211 Encounter for screening for malignant neoplasm of colon: Secondary | ICD-10-CM

## 2011-03-13 DIAGNOSIS — Z01419 Encounter for gynecological examination (general) (routine) without abnormal findings: Secondary | ICD-10-CM | POA: Insufficient documentation

## 2011-03-13 DIAGNOSIS — Z0000 Encounter for general adult medical examination without abnormal findings: Secondary | ICD-10-CM

## 2011-03-13 LAB — CONVERTED CEMR LAB: OCCULT 1: NEGATIVE

## 2011-03-14 ENCOUNTER — Encounter: Payer: Self-pay | Admitting: Family Medicine

## 2011-03-14 LAB — CONVERTED CEMR LAB
BUN: 13 mg/dL (ref 6–23)
Chloride: 102 meq/L (ref 96–112)
Creatinine, Ser: 0.7 mg/dL (ref 0.40–1.20)
Hgb A1c MFr Bld: 6.3 % — ABNORMAL HIGH (ref <5.7)
Potassium: 3.9 meq/L (ref 3.5–5.3)

## 2011-03-16 ENCOUNTER — Encounter: Payer: Self-pay | Admitting: *Deleted

## 2011-03-23 NOTE — Assessment & Plan Note (Signed)
Summary: physical   Vital Signs:  Patient profile:   60 year old female Menstrual status:  hysterectomy Height:      64 inches Weight:      175 pounds BMI:     30.15 O2 Sat:      100 % Pulse rate:   73 / minute Pulse rhythm:   regular Resp:     16 per minute BP sitting:   124 / 84  (left arm) Cuff size:   large  Vitals Entered By: Everitt Amber LPN (March 13, 2011 1:19 PM)  Nutrition Counseling: Patient's BMI is greater than 25 and therefore counseled on weight management options. CC: CPE  Vision Screening:Left eye with correction: 20 / 15 Right eye with correction: 20 / 15 Both eyes with correction: 20 / 15  Color vision testing: normal      Vision Entered By: Everitt Amber LPN (March 13, 2011 1:25 PM)   Primary Care Provider:  Syliva Overman MD  CC:  CPE.  History of Present Illness: Reports  that she has been fairly well. Denies recent fever or chills. Denies sinus pressure, nasal congestion , ear pain or sore throat.Last week had nasal burning and discomfort, has been unable to find her flonase and thinks this is the problem. Denies chest congestion, or cough productive of sputum. Denies chest pain, palpitations, PND, orthopnea or leg swelling. Denies abdominal pain, nausea, vomitting, diarrhea or constipation. Denies change in bowel movements or bloody stool. Denies dysuria , frequency, incontinence or hesitancy.  Denies headaches, vertigo, seizures. Denies depression, anxiety or insomnia. Denies  rash, lesions, or itch. Trying to lose weight , disappointed at slow rate , wants inc dose ofphentermine.     Current Medications (verified): 1)  Flonase 50 Mcg/act Susp (Fluticasone Propionate) .... 2 Sprays in Each Nostril Daily 2)  Phentermine Hcl 37.5 Mg Tabs (Phentermine Hcl) .... Take 1 Tablet By Mouth Once A Day  Allergies (verified): 1)  ! * Vitamin D 16109 Units  Review of Systems      See HPI Eyes:  Denies discharge, eye pain, and red eye. MS:   Complains of low back pain; acute low back pain radiating down last week, no specific aggravating factor noted, improving with anti-inflammatories and muscle relaxants. Endo:  Denies cold intolerance, excessive hunger, excessive thirst, excessive urination, and heat intolerance. Heme:  Denies abnormal bruising and bleeding. Allergy:  Complains of seasonal allergies; denies hives or rash and itching eyes; increasedaymptoms.  Physical Exam  General:  Well-developed,well-nourished,in no acute distress; alert,appropriate and cooperative throughout examination Head:  Normocephalic and atraumatic without obvious abnormalities. No apparent alopecia or balding. Eyes:  No corneal or conjunctival inflammation noted. EOMI. Perrla. Funduscopic exam benign, without hemorrhages, exudates or papilledema. Vision grossly normal. Ears:  External ear exam shows no significant lesions or deformities.  Otoscopic examination reveals bilateral cerumen , right grtr than left. Hearing is grossly normal bilaterally. Nose:  External nasal examination shows no deformity or inflammation. Nasal mucosa are pink and moist without lesions or exudates.Very narrow nasal canal Mouth:  Oral mucosa and oropharynx without lesions or exudates.  Teeth in good repair. Neck:  No deformities, masses, or tenderness noted. Chest Wall:  No deformities, masses, or tenderness noted. Breasts:  No mass, nodules, thickening, tenderness, bulging, retraction, inflamation, nipple discharge or skin changes noted.   Lungs:  Normal respiratory effort, chest expands symmetrically. Lungs are clear to auscultation, no crackles or wheezes. Heart:  Normal rate and regular rhythm. S1 and S2 normal  without gallop, murmur, click, rub or other extra sounds. Abdomen:  Bowel sounds positive,abdomen soft and non-tender without masses, organomegaly or hernias noted. Rectal:  No external abnormalities noted. Normal sphincter tone. No rectal masses or  tenderness. Genitalia:  Normal introitus for age, no external lesions, no vaginal discharge, mucosa pink and moist, no vaginal or cervical lesions, no vaginal atrophy, no friaility or hemorrhage,uterus absent, no adnexal masses or tenderness Msk:  No deformity or scoliosis noted of thoracic or lumbar spine.   Pulses:  R and L carotid,radial,femoral,dorsalis pedis and posterior tibial pulses are full and equal bilaterally Extremities:  decreased ROM lumbar spine, normal in shoulders, hips and knees, no edema Neurologic:  No cranial nerve deficits noted. Station and gait are normal. Plantar reflexes are down-going bilaterally. DTRs are symmetrical throughout. Sensory, motor and coordinative functions appear intact. Skin:  Intact without suspicious lesions or rashes Cervical Nodes:  No lymphadenopathy noted Axillary Nodes:  No palpable lymphadenopathy Inguinal Nodes:  No significant adenopathy Psych:  Cognition and judgment appear intact. Alert and cooperative with normal attention span and concentration. No apparent delusions, illusions, hallucinations   Impression & Recommendations:  Problem # 1:  IMPAIRED FASTING GLUCOSE (ICD-790.21) Assessment Deteriorated  Her updated medication list for this problem includes:    Metformin Hcl 500 Mg Tabs (Metformin hcl) .Marland Kitchen... Take 1 tablet by mouth once a day Pt advised to reduce carbohydrate intake, espescially sweets, and to start regular physical activity, at least 30 minutes 5 days weekly, to enable weight loss, and reduce the risk of becoming diabetic   Orders: T- Hemoglobin A1C ([id number])  Problem # 2:  HYPERLIPIDEMIA (ICD-272.4) Assessment: Unchanged  Her updated medication list for this problem includes:    Pravastatin Sodium 40 Mg Tabs (Pravastatin sodium) .Marland Kitchen... Take 1 tab by mouth at bedtime Low fat dietdiscussed and encouraged  Labs Reviewed: SGOT: 13 (10/19/2009)   SGPT: 14 (10/19/2009)   HDL:60 (01/06/2011), 60 (09/22/2010)   LDL:138 (01/06/2011), 139 (09/22/2010)  Chol:218 (01/06/2011), 220 (09/22/2010)  Trig:100 (01/06/2011), 103 (09/22/2010)  Problem # 3:  OBESITY (ICD-278.00) Assessment: Improved  Ht: 64 (03/13/2011)   Wt: 175 (03/13/2011)   BMI: 30.15 (03/13/2011) therapeutic lifestyle change discussed and encouraged  Complete Medication List: 1)  Flonase 50 Mcg/act Susp (Fluticasone propionate) .... 2 sprays in each nostril daily 2)  Phentermine Hcl 37.5 Mg Tabs (Phentermine hcl) .... Take 1 tablet by mouth once a day 3)  Metformin Hcl 500 Mg Tabs (Metformin hcl) .... Take 1 tablet by mouth once a day 4)  Pravastatin Sodium 40 Mg Tabs (Pravastatin sodium) .... Take 1 tab by mouth at bedtime  Other Orders: T-Basic Metabolic Panel [phone]) Hemoccult Guaiac-1 spec.(in office) (82270) Pap Smear (29562)  Patient Instructions: 1)  Please schedule a follow-up appointment in 2 months. 2)  It is important that you exercise regularly at least 20 minutes 5 times a week. If you develop chest pain, have severe difficulty breathing, or feel very tired , stop exercising immediately and seek medical attention. 3)  You need to lose weight. Consider a lower calorie diet and regular exercise. congrats on your 4 pound weight loss. 4)  we will provide a 1200 and a 1500 cal diet sheet. 5)  dose increase on phentermine to oNE daily. 6)  two new meds as discussed. 7)  BMP prior to visit, ICD-9: 8)  HbgA1C prior to visit, ICD-9:  non fasting  Prescriptions: FLONASE 50 MCG/ACT SUSP (FLUTICASONE PROPIONATE) 2 sprays in each nostril daily  #1 x  3   Entered by:   Adella Hare LPN   Authorized by:   Syliva Overman MD   Signed by:   Adella Hare LPN on 16/09/9603   Method used:   Electronically to        [facility] (retail)       [address]       [place], [place]  [address]       Ph: [phone]       Fax: [phone]   RxID:   [id number] PHENTERMINE HCL 37.5 MG TABS  (PHENTERMINE HCL) Take 1 tablet by mouth once a day  #30 x 1   Entered by:   Adella Hare LPN   Authorized by:   Syliva Overman MD   Signed by:   Adella Hare LPN on 10/21/2535   Method used:   Print[facility] then fax[facility] to ...       [facility] ([facility]       [address]       [place], [place]  [address]       Ph: [phone]       Fax: [phone]   RxID:   [id number] PRAVASTATIN SODIUM 40 MG TABS (PRAVASTATIN SODIUM) Take 1 tab by mouth at bedtime  #30 x 3   Entered and Authorized by:   Syliva Overman MD   Signed by:   Syliva Overman MD on 03/13/2011   Method used:   Electronically to        [facility] (retail)       [address]       [place], [place]  [address]       Ph: [phone]       Fax: [phone]   RxID:   [id number] METFORMIN HCL 500 MG TABS (METFORMIN HCL) Take 1 tablet by mouth once a day  #30 x 2   Entered and Authorized by:   Syliva Overman MD   Signed by:   Syliva Overman MD on 03/13/2011   Method used:   Electronically to        [facility] (retail)       [address]       [place], [place]  [address]       Ph: [phone]       Fax: [phone]   RxID:   [id number]    Orders Added: 1)  Est. Patient 40-64 years [99396] 2)  T-Basic Metabolic Panel [phone] 3)  T- Hemoglobin A1C [83036-23375] 4)  Hemoccult Guaiac-1 spec.(in office) [82270] 5)  Pap Smear [88150]    Laboratory Results  Date/Time Received: March 13, 2011 2:32 PM  Date/Time Reported: March 13, 2011 2:32 PM   Stool - Occult Blood Hemmoccult #1: negative Date: 03/13/2011 Comments: 5030 5/14 51301 13L 10/13 Adella Hare LPN  March 13, 2011 2:32 PM

## 2011-03-29 ENCOUNTER — Encounter: Payer: Self-pay | Admitting: Family Medicine

## 2011-04-03 ENCOUNTER — Ambulatory Visit (INDEPENDENT_AMBULATORY_CARE_PROVIDER_SITE_OTHER): Payer: BC Managed Care – PPO | Admitting: Family Medicine

## 2011-04-03 VITALS — BP 110/70 | Wt 175.0 lb

## 2011-04-03 DIAGNOSIS — H6120 Impacted cerumen, unspecified ear: Secondary | ICD-10-CM

## 2011-04-03 NOTE — Progress Notes (Signed)
Only right ear impacted, cleared and checked by dr simpson, patient tolerated well

## 2011-04-08 NOTE — Progress Notes (Signed)
  Subjective:    Patient ID: Stacey Smith, female    DOB: 04-02-1951, 60 y.o.   MRN: [id number]  HPI    Review of Systems     Objective:   Physical Exam        Assessment & Plan:  Succesful irrigation of ear, with no trauma to tympanic membrane

## 2011-05-11 ENCOUNTER — Encounter: Payer: Self-pay | Admitting: Family Medicine

## 2011-05-12 NOTE — Op Note (Signed)
[name], [name]                ACCOUNT NO.:  [id number]   MEDICAL RECORD NO.:  [id number]          PATIENT TYPE:  AMB   LOCATION:  DAY                           FACILITY:  [facility]   PHYSICIAN:  R. Roetta Sessions, M.D. DATE OF BIRTH:  05/01/1951   DATE OF PROCEDURE:  10/11/2006  DATE OF DISCHARGE:                                 OPERATIVE REPORT   PROCEDURE:  Colonoscopy with biopsy.   INDICATIONS FOR PROCEDURE:  The patient is a 60 year old African-American  female sent over through the courtesy of Dr. Syliva Overman for colorectal  cancer screening. She had a standard colonoscopy for average risk screening  back on July 21, 2004. She had a diminutive polyp at 30 cm that was cold  biopsied and turned out to be hyperplastic. The remainder of the colon  appeared normal. However, since that time unfortunately she has 2 sisters  which have been diagnosed with colorectal cancer, both last year; one 1 year  younger than [name] and her older sister age 79 was diagnosed with the  disease. [name] continues to have no lower GI tract symptoms. Dr. Lodema Hong  has sent her over now for repeat high-risk screening colonoscopy. This  approach has been discussed with the patient at length. The potential risks,  benefits, and alternatives have been reviewed, questions answered. She is  agreeable. Please see documentation in the medical record.   MONITORING:  O2 saturation, blood pressure, pulse and respirations were  monitored throughout the entire procedure.   CONSCIOUS SEDATION:  Versed 4 mg IV, Demerol 75 mg IV in divided doses.   INSTRUMENT:  Olympus video chip system.   FINDINGS:  Digital rectal exam revealed no abnormalities.   ENDOSCOPIC FINDINGS:  The prep was good.   RECTUM:  Examination of the rectal mucosa including a retroflexed view of  the anal verge revealed no abnormalities.   COLON:  The colonic mucosa was surveyed from the rectosigmoid junction  through the left  transverse and right colon to the area of the appendiceal  orifice, ileocecal valve and cecum. These structures were well seen and  photographed for the record. From this level, the scope was slowly  withdrawn, all previously mentioned mucosal surfaces were again seen. The  patient had a 3 mm diminutive polyp at the splenic flexure and a second one  in the mid sigmoid. They were both cold biopsied/removed. The remainder of  the colonic mucosa appeared entirely normal. The patient tolerated the  procedure well and was reacted in endoscopy.   IMPRESSION:  Normal rectum, diminutive polyps in the left colon cold  biopsied/removed. The remainder of the colonic mucosa appeared normal.   RECOMMENDATIONS:  1. Rule out on path.  2. Further recommendations to follow.      Jonathon Bellows, M.D.  Electronically Signed     RMR/MEDQ  D:  10/11/2006  T:  10/12/2006  Job:  [id number]   cc:   Milus Mallick. Lodema Hong, M.D.  Fax: [phone]

## 2011-05-12 NOTE — Op Note (Signed)
[name], [name]                          ACCOUNT NO.:  [id number]   MEDICAL RECORD NO.:  [id number]                   PATIENT TYPE:  AMB   LOCATION:  DAY                                  FACILITY:  [facility]   PHYSICIAN:  R. Roetta Sessions, M.D.              DATE OF BIRTH:  [birth year]   DATE OF PROCEDURE:  07/21/2004  DATE OF DISCHARGE:                                 OPERATIVE REPORT   PROCEDURE:  Colonoscopy with biopsy.   INDICATIONS FOR PROCEDURE:  The patient is a 60 year old lady sent over by  the courtesy of Dr. Syliva Overman for colorectal cancer screening.  She  has never had a colonoscopy.  There is no family history of colon cancer,  and Stacey Smith is devoid of any GI symptoms.  Colonoscopy is now being done  as a standard screening maneuver.  This approach has been discussed with the  patient at length.  The potential risks, benefits, and alternatives have  been reviewed and questions answered.  She is agreeable.  Please see the  documentation in the medical record for more information.   PROCEDURE:  O2 saturation, blood pressure, pulses, and respirations were  monitored throughout the entirety of the procedure.  Conscious sedation was  with Versed 3 mg IV, Demerol 75 mg IV in divided doses.  The instrument used  was the Olympus video chip system.   FINDINGS:  Digital rectal examination revealed no abnormalities.   ENDOSCOPIC FINDINGS:  The prep was excellent.   Rectum:  Examination of the rectal mucosa including retroflex view of the  anal verge revealed no abnormalities.   Colon:  The colonic mucosa was surveyed from the rectosigmoid junction  through the left, transverse, right colon to the area of the appendiceal  orifice, ileocecal valve, and cecum.  These structures were well-seen and  photographed for the record.  From this level, the scope was slowly  withdrawn.  All previously mentioned mucosal surfaces were again seen.  The  only abnormality observed was  a 3-mm polyp at 30 cm which was cold  biopsy/removed.  The remainder of the colonic mucosa appeared normal.  The  patient tolerated the procedure well and was reactive in endoscopy.   IMPRESSION:  1. Normal rectum.  2. Diminutive polyp at 30 cm, cold biopsy/removed.  3. The remainder of the colonic mucosa appeared normal.   RECOMMENDATIONS:  1. Follow up on pathology.  2. Further recommendations to follow.      ___________________________________________                                            Jonathon Bellows, M.D.   RMR/MEDQ  D:  07/21/2004  T:  07/21/2004  Job:  [id number]   cc:   Milus Mallick. Lodema Hong, M.D.  [phone]  [address]  [place], [place] [address]  Fax: [phone]

## 2011-05-17 ENCOUNTER — Encounter: Payer: Self-pay | Admitting: Family Medicine

## 2011-05-17 ENCOUNTER — Ambulatory Visit (INDEPENDENT_AMBULATORY_CARE_PROVIDER_SITE_OTHER): Payer: BC Managed Care – PPO | Admitting: Family Medicine

## 2011-05-17 DIAGNOSIS — E669 Obesity, unspecified: Secondary | ICD-10-CM

## 2011-05-17 DIAGNOSIS — R5381 Other malaise: Secondary | ICD-10-CM

## 2011-05-17 DIAGNOSIS — R5383 Other fatigue: Secondary | ICD-10-CM

## 2011-05-17 DIAGNOSIS — E785 Hyperlipidemia, unspecified: Secondary | ICD-10-CM

## 2011-05-17 DIAGNOSIS — R7301 Impaired fasting glucose: Secondary | ICD-10-CM

## 2011-05-17 NOTE — Patient Instructions (Addendum)
F/u in 3 months.  It is important that you exercise regularly at least 30 minutes 5 times a week. If you develop chest pain, have severe difficulty breathing, or feel very tired, stop exercising immediately and seek medical attention    A healthy diet is rich in fruit, vegetables and whole grains. Poultry fish, nuts and beans are a healthy choice for protein rather then red meat. A low sodium diet and drinking 64 ounces of water daily is generally recommended. Oils and sweet should be limited. Carbohydrates especially for those who are diabetic or overweight, should be limited to 34-45 gram per meal. It is important to eat on a regular schedule, at least 3 times daily. Snacks should be primarily fruits, vegetables or nuts.   pls start daily metformin, phentermine  And pravstatin as prescribed.  Call if further problems.  Fasting labs in 3 months

## 2011-05-22 NOTE — Assessment & Plan Note (Signed)
Pt strongly encouraged to take metformin as prescribed, tp delay or prevent dev of DM, her family is diabetic

## 2011-05-22 NOTE — Assessment & Plan Note (Signed)
Deteriorated. Patient re-educated about  the importance of commitment to a  minimum of 150 minutes of exercise per week. The importance of healthy food choices with portion control discussed. Encouraged to start a food diary, count calories and to consider  joining a support group. Sample diet sheets offered. Goals set by the patient for the next several months.    

## 2011-05-22 NOTE — Assessment & Plan Note (Signed)
Unchanged, med compliance stressed esp in light of multiple co-morbidities, also dietary change also

## 2011-05-22 NOTE — Progress Notes (Signed)
  Subjective:    Patient ID: Stacey Smith, female    DOB: [birth year], 60 y.o.   MRN: [id number]  HPI The PT is here for follow up and re-evaluation of chronic medical conditions, medication management and review of any  recent lab and radiology data.  Preventive health is updated, specifically  Cancer screening,  and Immunization.   Reports diareah with metformin and stopped taking it. Reports inc stress and anxiety which she relates to work, but is clearly having relationship issues also       Review of Systems Denies recent fever or chills. Denies sinus pressure, nasal congestion, ear pain or sore throat. Denies chest congestion, productive cough or wheezing. Denies chest pains, palpitations, paroxysmal nocturnal dyspnea, orthopnea and leg swelling Denies abdominal pain, nausea, vomiting,diarrhea or constipation.  Denies rectal bleeding or change in bowel movement. Denies dysuria, frequency, hesitancy or incontinence. Denies joint pain, swelling and limitation in mobility. Denies headaches, seizure, numbness, or tingling. Denies polyuria, polydipsia, or dry mouth Denies skin break down or rash.        Objective:   Physical Exam Patient alert and oriented and in no Cardiopulmonary distress.  HEENT: No facial asymmetry, EOMI, no sinus tenderness, , Oropharynx pink and moist.  Neck supple  Chest: Clear to auscultation bilaterally.  CVS: S1, S2 no murmurs, no S3.  ABD: Soft non tender. Bowel sounds normal.  Ext: No edema  MS: Adequate ROM spine, shoulders, hips and knees.  Skin: Intact, no ulcerations or rash noted.  Psych: Good eye contact, flat  affect. Memory intact  Mildly anxious and r depressed appearing.  CNS: CN 2-12 intact, power, tone and sensation normal throughout.        Assessment & Plan:

## 2011-08-15 ENCOUNTER — Encounter: Payer: Self-pay | Admitting: Family Medicine

## 2011-08-16 ENCOUNTER — Encounter: Payer: Self-pay | Admitting: Family Medicine

## 2011-08-16 ENCOUNTER — Ambulatory Visit (INDEPENDENT_AMBULATORY_CARE_PROVIDER_SITE_OTHER): Payer: BC Managed Care – PPO | Admitting: Family Medicine

## 2011-08-16 VITALS — BP 120/76 | HR 68 | Resp 16 | Ht 63.5 in | Wt 180.1 lb

## 2011-08-16 DIAGNOSIS — E669 Obesity, unspecified: Secondary | ICD-10-CM

## 2011-08-16 DIAGNOSIS — R7301 Impaired fasting glucose: Secondary | ICD-10-CM

## 2011-08-16 DIAGNOSIS — E785 Hyperlipidemia, unspecified: Secondary | ICD-10-CM

## 2011-08-16 DIAGNOSIS — Z1211 Encounter for screening for malignant neoplasm of colon: Secondary | ICD-10-CM

## 2011-08-16 MED ORDER — PHENTERMINE HCL 37.5 MG PO CAPS: 37.5000 mg | ORAL_CAPSULE | ORAL | Status: DC

## 2011-08-16 NOTE — Patient Instructions (Addendum)
F/u in 3 months.  No med changes.  It is important that you exercise regularly at least 30 minutes 5 times a week. If you develop chest pain, have severe difficulty breathing, or feel very tired, stop exercising immediately and seek medical attention    A healthy diet is rich in fruit, vegetables and whole grains. Poultry fish, nuts and beans are a healthy choice for protein rather then red meat. A low sodium diet and drinking 64 ounces of water daily is generally recommended. Oils and sweet should be limited. Carbohydrates especially for those who are diabetic or overweight, should be limited to 30-45 gram per meal. It is important to eat on a regular schedule, at least 3 times daily. Snacks should be primarily fruits, vegetables or nuts.  hBA1c today.  Fasting  Lipid and hBA1C in 3 months, 5 days  Before next viisit  You absolutely need to start eating regularly on a schedule..All  the best... You cAN do this  You are being referred for a colonscopy to Dr Darrick Penna or Jena Gauss

## 2011-08-25 NOTE — Assessment & Plan Note (Signed)
Currently taking metformin, stressed lifestyle change to delay diabetes

## 2011-08-25 NOTE — Assessment & Plan Note (Signed)
Uncontrolled, pt to take lipid lowering agent, now she is prediabetic

## 2011-08-25 NOTE — Assessment & Plan Note (Signed)
Unchanged. Patient re-educated about  the importance of commitment to a  minimum of 150 minutes of exercise per week. The importance of healthy food choices with portion control discussed. Encouraged to start a food diary, count calories and to consider  joining a support group. Sample diet sheets offered. Goals set by the patient for the next several months.    

## 2011-08-25 NOTE — Progress Notes (Signed)
  Subjective:    Patient ID: Stacey Smith, female    DOB: 22-Apr-1951, 60 y.o.   MRN: [id number]  HPI The PT is here for follow up and re-evaluation of chronic medical conditions, medication management and review of any available recent lab and radiology data.  Preventive health is updated, specifically  Cancer screening and Immunization.   Questions or concerns regarding consultations or procedures which the PT has had in the interim are  addressed. The PT denies any adverse reactions to current medications since the last visit.  There are no new concerns.  There are no specific complaints She is frustarted with weight loss efforts, but has done very little  To change habits      Review of Systems Denies recent fever or chills. Denies sinus pressure, nasal congestion, ear pain or sore throat. Denies chest congestion, productive cough or wheezing. Denies chest pains, palpitations and leg swelling Denies abdominal pain, nausea, vomiting,diarrhea or constipation.   Denies dysuria, frequency, hesitancy or incontinence. Denies joint pain, swelling and limitation in mobility. Denies headaches, seizures, numbness, or tingling. Denies depression, anxiety or insomnia. Denies skin break down or rash.        Objective:   Physical Exam Patient alert and oriented and in no cardiopulmonary distress.  HEENT: No facial asymmetry, EOMI, no sinus tenderness,  oropharynx pink and moist.  Neck supple no adenopathy.  Chest: Clear to auscultation bilaterally.  CVS: S1, S2 no murmurs, no S3.  ABD: Soft non tender. Bowel sounds normal.  Ext: No edema  MS: Adequate ROM spine, shoulders, hips and knees.  Skin: Intact, no ulcerations or rash noted.  Psych: Good eye contact, normal affect. Memory intact not anxious or depressed appearing.  CNS: CN 2-12 intact, power, tone and sensation normal throughout.        Assessment & Plan:

## 2011-08-31 ENCOUNTER — Telehealth: Payer: Self-pay

## 2011-08-31 NOTE — Telephone Encounter (Signed)
LMOM for pt to return call in reference to colonoscopy. ( Last colonoscopy was in 09/2006)

## 2011-09-05 NOTE — Telephone Encounter (Signed)
Pt scheduled for an OV with Gerrit Halls, NP on 09/07/2011 @ 3:00 PM . Has Hx of polyps. Faxed her appt  Date and time to [phone] to her to give at work.

## 2011-09-07 ENCOUNTER — Encounter: Payer: Self-pay | Admitting: Gastroenterology

## 2011-09-07 ENCOUNTER — Ambulatory Visit (INDEPENDENT_AMBULATORY_CARE_PROVIDER_SITE_OTHER): Payer: BC Managed Care – PPO | Admitting: Gastroenterology

## 2011-09-07 DIAGNOSIS — Z86010 Personal history of colonic polyps: Secondary | ICD-10-CM

## 2011-09-07 DIAGNOSIS — D369 Benign neoplasm, unspecified site: Secondary | ICD-10-CM

## 2011-09-07 NOTE — Assessment & Plan Note (Signed)
60 year old female with hx of tubular adenoma in 2007, as well as family history of colon cancer (sister). She has no rectal bleeding or change in bowel habits. She denies abdominal pain. Her only issue is with alternating constipation and diarrhea, which she states is chronic. She is currently not on a bowel regimen. We will proceed with a high fiber diet, Miralax if constipation, probiotic. Colonoscopy in near future.  Proceed with TCS with Dr. Jena Gauss in near future: the risks, benefits, and alternatives have been discussed with the patient in detail. The patient states understanding and desires to proceed.

## 2011-09-07 NOTE — Progress Notes (Signed)
Primary Care Physician:  Syliva Overman, MD, MD Primary Gastroenterologist:  Dr.   Chief Complaint  Patient presents with  . Colonoscopy    HPI:   Stacey Smith is a 60 year old pleasant female who has a family history of colon cancer (sister). Her last colonoscopy was in 2007 with a tubular adenoma. Prior to this, she had a hyperplastic polyp in 2005. She presents today for a visit prior to colonoscopy. She denies any abdominal pain, nausea or vomiting. She has no lack of appetite or weight loss. She denies melena or bright red blood per rectum. She tends to fluctuate between constipation and loose stools, going from one extreme to the other. She uses enemas intermittently. She has not tried Miralax for constipation or a probiotic.    Past Medical History  Diagnosis Date  . GERD (gastroesophageal reflux disease)   . Obesity   . Hyperlipidemia   . S/P colonoscopy Oct 2007    Normal rectum, diminutive polyps in the left colon cold: TUBULAR ADENOMA  . Back pain     Past Surgical History  Procedure Date  . Total abdominal hysterectomy 1985    right ovary fibroids   . Excision of cyst for thyroid gland non cancer 1988  . Tonsillectomy 1960    Current Outpatient Prescriptions  Medication Sig Dispense Refill  . cyclobenzaprine (FLEXERIL) 10 MG tablet Take 10 mg by mouth 3 (three) times daily as needed. Take one tablet by mouth at bedtime for 1 week, then as needed for neck spasm       . fluticasone (FLONASE) 50 MCG/ACT nasal spray 2 sprays by Nasal route daily.        Marland Kitchen ibuprofen (ADVIL,MOTRIN) 800 MG tablet Take 800 mg by mouth every 8 (eight) hours as needed. Take 1 tablet by mouth three times a day for 1 week, then one daily as needed for pain       . metFORMIN (GLUCOPHAGE) 500 MG tablet Take 500 mg by mouth daily.        . phentermine 37.5 MG capsule Take 1 capsule (37.5 mg total) by mouth every morning.  30 capsule  2  . pravastatin (PRAVACHOL) 40 MG tablet Take 40 mg by mouth  at bedtime.          Allergies as of 09/07/2011  . (No Known Allergies)    Family History  Problem Relation Age of Onset  . Stroke Mother   . Hypertension Mother   . Cancer Mother     breast   . Asthma Mother   . Glaucoma Mother   . Coronary artery disease Father   . Diabetes Father   . Heart failure Father   . Diabetes Sister   . Hypertension Sister   . Cancer Sister     colon   . Thyroid disease Sister   . Depression Sister   . Bipolar disorder Sister   . Colon cancer Sister     unsure what age diagnosed    History   Social History  . Marital Status: Divorced    Spouse Name: N/A    Number of Children: 2  . Years of Education: N/A   Occupational History  . postal service     [employer]   Social History Main Topics  . Smoking status: Former Games developer  . Smokeless tobacco: Not on file  . Alcohol Use: Yes     occasionally   . Drug Use: No  . Sexually Active: Not on file  Review of Systems: Gen: Denies any fever, chills, fatigue, weight loss, lack of appetite.  CV: Denies chest pain, heart palpitations, peripheral edema, syncope.  Resp: Denies shortness of breath at rest or with exertion. Denies wheezing or cough.  GI: Denies dysphagia or odynophagia. Denies jaundice, hematemesis, fecal incontinence. GU : Denies urinary burning, urinary frequency, urinary hesitancy MS: Denies joint pain, muscle weakness, cramps, or limitation of movement.  Derm: Denies rash, itching, dry skin Psych: Denies depression, anxiety, memory loss, and confusion Heme: Denies bruising, bleeding, and enlarged lymph nodes.  Physical Exam: BP 142/80  Pulse 69  Temp(Src) 98.1 F (36.7 C) (Temporal)  Ht 5\' 3"  (1.6 m)  Wt 181 lb 9.6 oz (82.373 kg)  BMI 32.17 kg/m2 General:   Alert and oriented. Pleasant and cooperative. Well-nourished and well-developed.  Head:  Normocephalic and atraumatic. Eyes:  Without icterus, sclera clear and conjunctiva pink.  Ears:  Normal auditory  acuity. Nose:  No deformity, discharge,  or lesions. Mouth:  No deformity or lesions, oral mucosa pink.  Neck:  Supple, without mass or thyromegaly. Lungs:  Clear to auscultation bilaterally. No wheezes, rales, or rhonchi. No distress.  Heart:  S1, S2 present without murmurs appreciated.  Abdomen:  +BS, soft, non-tender and non-distended. No HSM noted. No guarding or rebound. No masses appreciated.  Rectal:  Deferred  Msk:  Symmetrical without gross deformities. Normal posture. Extremities:  Without clubbing or edema. Neurologic:  Alert and  oriented x4;  grossly normal neurologically. Skin:  Intact without significant lesions or rashes. Cervical Nodes:  No significant cervical adenopathy. Psych:  Alert and cooperative. Normal mood and affect.

## 2011-09-07 NOTE — Patient Instructions (Signed)
We have set you up for a colonoscopy with Dr. Jena Gauss.  Start taking a Probiotic daily and incorporate fiber into your diet. Please see the handout.  Further recommendations after colonoscopy is completed.

## 2011-09-07 NOTE — Progress Notes (Signed)
Cc to PCP 

## 2011-09-15 ENCOUNTER — Encounter: Payer: Self-pay | Admitting: Internal Medicine

## 2011-10-02 MED ORDER — SODIUM CHLORIDE 0.45 % IV SOLN
Freq: Once | INTRAVENOUS | Status: AC
  Administered 2011-10-03: 10:00:00 via INTRAVENOUS

## 2011-10-03 ENCOUNTER — Ambulatory Visit (HOSPITAL_COMMUNITY)
Admission: RE | Admit: 2011-10-03 | Discharge: 2011-10-03 | Disposition: A | Discharge status: Home / Self Care | Payer: Federal, State, Local not specified - PPO | Source: Ambulatory Visit | Attending: Internal Medicine | Admitting: Internal Medicine

## 2011-10-03 ENCOUNTER — Encounter (HOSPITAL_COMMUNITY)
Admission: RE | Disposition: A | Discharge status: Home / Self Care | Payer: Self-pay | Source: Ambulatory Visit | Attending: Internal Medicine

## 2011-10-03 ENCOUNTER — Encounter (HOSPITAL_COMMUNITY): Payer: Self-pay | Admitting: *Deleted

## 2011-10-03 ENCOUNTER — Other Ambulatory Visit: Payer: Self-pay | Admitting: Internal Medicine

## 2011-10-03 DIAGNOSIS — K620 Anal polyp: Secondary | ICD-10-CM

## 2011-10-03 DIAGNOSIS — D128 Benign neoplasm of rectum: Secondary | ICD-10-CM | POA: Insufficient documentation

## 2011-10-03 DIAGNOSIS — Z800 Family history of malignant neoplasm of digestive organs: Secondary | ICD-10-CM | POA: Insufficient documentation

## 2011-10-03 DIAGNOSIS — Z860100 Personal history of colon polyps, unspecified: Secondary | ICD-10-CM | POA: Insufficient documentation

## 2011-10-03 DIAGNOSIS — E119 Type 2 diabetes mellitus without complications: Secondary | ICD-10-CM | POA: Insufficient documentation

## 2011-10-03 DIAGNOSIS — K5730 Diverticulosis of large intestine without perforation or abscess without bleeding: Secondary | ICD-10-CM

## 2011-10-03 DIAGNOSIS — D126 Benign neoplasm of colon, unspecified: Secondary | ICD-10-CM

## 2011-10-03 DIAGNOSIS — Z1211 Encounter for screening for malignant neoplasm of colon: Secondary | ICD-10-CM

## 2011-10-03 DIAGNOSIS — K621 Rectal polyp: Secondary | ICD-10-CM

## 2011-10-03 HISTORY — DX: Polyp of colon: K63.5

## 2011-10-03 HISTORY — PX: COLONOSCOPY: SHX5424

## 2011-10-03 LAB — GLUCOSE, CAPILLARY

## 2011-10-03 SURGERY — COLONOSCOPY
Anesthesia: Moderate Sedation

## 2011-10-03 MED ORDER — STERILE WATER FOR IRRIGATION IR SOLN
Status: DC | PRN
  Administered 2011-10-03: 11:00:00

## 2011-10-03 MED ORDER — MIDAZOLAM HCL 5 MG/5ML IJ SOLN
INTRAMUSCULAR | Status: DC | PRN
  Administered 2011-10-03: 1 mg via INTRAVENOUS
  Administered 2011-10-03: 2 mg via INTRAVENOUS

## 2011-10-03 MED ORDER — MIDAZOLAM HCL 5 MG/5ML IJ SOLN
INTRAMUSCULAR | Status: AC
  Filled 2011-10-03: qty 10

## 2011-10-03 MED ORDER — MEPERIDINE HCL 100 MG/ML IJ SOLN
INTRAMUSCULAR | Status: AC
  Filled 2011-10-03: qty 2

## 2011-10-03 MED ORDER — MEPERIDINE HCL 100 MG/ML IJ SOLN
INTRAMUSCULAR | Status: DC | PRN
  Administered 2011-10-03: 25 mg via INTRAVENOUS
  Administered 2011-10-03: 50 mg via INTRAVENOUS

## 2011-10-03 NOTE — H&P (Signed)
Gerrit Halls, NP  09/07/2011  3:35 PM  Signed     Primary Care Physician:  Syliva Overman, MD, MD Primary Gastroenterologist:  Dr.     Chief Complaint   Patient presents with   .  Colonoscopy      HPI:    Ms. Burggraf is a 60 year old pleasant female who has a family history of colon cancer (sister). Her last colonoscopy was in 2007 with a tubular adenoma. Prior to this, she had a hyperplastic polyp in 2005. She presents today for a visit prior to colonoscopy. She denies any abdominal pain, nausea or vomiting. She has no lack of appetite or weight loss. She denies melena or bright red blood per rectum. She tends to fluctuate between constipation and loose stools, going from one extreme to the other. She uses enemas intermittently. She has not tried Miralax for constipation or a probiotic.       Past Medical History   Diagnosis  Date   .  GERD (gastroesophageal reflux disease)     .  Obesity     .  Hyperlipidemia     .  S/P colonoscopy  Oct 2007       Normal rectum, diminutive polyps in the left colon cold: TUBULAR ADENOMA   .  Back pain         Past Surgical History   Procedure  Date   .  Total abdominal hysterectomy  1985       right ovary fibroids    .  Excision of cyst for thyroid gland non cancer  1988   .  Tonsillectomy  1960       Current Outpatient Prescriptions   Medication  Sig  Dispense  Refill   .  cyclobenzaprine (FLEXERIL) 10 MG tablet  Take 10 mg by mouth 3 (three) times daily as needed. Take one tablet by mouth at bedtime for 1 week, then as needed for neck spasm          .  fluticasone (FLONASE) 50 MCG/ACT nasal spray  2 sprays by Nasal route daily.           Marland Kitchen  ibuprofen (ADVIL,MOTRIN) 800 MG tablet  Take 800 mg by mouth every 8 (eight) hours as needed. Take 1 tablet by mouth three times a day for 1 week, then one daily as needed for pain          .  metFORMIN (GLUCOPHAGE) 500 MG tablet  Take 500 mg by mouth daily.           .  phentermine 37.5 MG capsule   Take 1 capsule (37.5 mg total) by mouth every morning.   30 capsule   2   .  pravastatin (PRAVACHOL) 40 MG tablet  Take 40 mg by mouth at bedtime.               Allergies as of 09/07/2011   .  (No Known Allergies)       Family History   Problem  Relation  Age of Onset   .  Stroke  Mother     .  Hypertension  Mother     .  Cancer  Mother         breast    .  Asthma  Mother     .  Glaucoma  Mother     .  Coronary artery disease  Father     .  Diabetes  Father     .  Heart  failure  Father     .  Diabetes  Sister     .  Hypertension  Sister     .  Cancer  Sister         colon    .  Thyroid disease  Sister     .  Depression  Sister     .  Bipolar disorder  Sister     .  Colon cancer  Sister         unsure what age diagnosed       History       Social History   .  Marital Status:  Divorced       Spouse Name:  N/A       Number of Children:  2   .  Years of Education:  N/A       Occupational History   .  postal service         [employer]       Social History Main Topics   .  Smoking status:  Former Games developer   .  Smokeless tobacco:  Not on file   .  Alcohol Use:  Yes         occasionally    .  Drug Use:  No   .  Sexually Active:  Not on file        Review of Systems: Gen: Denies any fever, chills, fatigue, weight loss, lack of appetite.   CV: Denies chest pain, heart palpitations, peripheral edema, syncope.   Resp: Denies shortness of breath at rest or with exertion. Denies wheezing or cough.   GI: Denies dysphagia or odynophagia. Denies jaundice, hematemesis, fecal incontinence. GU : Denies urinary burning, urinary frequency, urinary hesitancy MS: Denies joint pain, muscle weakness, cramps, or limitation of movement.   Derm: Denies rash, itching, dry skin Psych: Denies depression, anxiety, memory loss, and confusion Heme: Denies bruising, bleeding, and enlarged lymph nodes.   Physical Exam: BP 142/80  Pulse 69  Temp(Src) 98.1 F (36.7 C) (Temporal)  Ht  5\' 3"  (1.6 m)  Wt 181 lb 9.6 oz (82.373 kg)  BMI 32.17 kg/m2 General:   Alert and oriented. Pleasant and cooperative. Well-nourished and well-developed.   Head:  Normocephalic and atraumatic. Eyes:  Without icterus, sclera clear and conjunctiva pink.   Ears:  Normal auditory acuity. Nose:  No deformity, discharge,  or lesions. Mouth:  No deformity or lesions, oral mucosa pink.   Neck:  Supple, without mass or thyromegaly. Lungs:  Clear to auscultation bilaterally. No wheezes, rales, or rhonchi. No distress.   Heart:  S1, S2 present without murmurs appreciated.   Abdomen:  +BS, soft, non-tender and non-distended. No HSM noted. No guarding or rebound. No masses appreciated.   Rectal:  Deferred   Msk:  Symmetrical without gross deformities. Normal posture. Extremities:  Without clubbing or edema. Neurologic:  Alert and  oriented x4;  grossly normal neurologically. Skin:  Intact without significant lesions or rashes. Cervical Nodes:  No significant cervical adenopathy. Psych:  Alert and cooperative. Normal mood and affect.         Glendora Score  09/07/2011  4:42 PM  Signed Cc to PCP        Tubular adenoma - Gerrit Halls, NP  09/07/2011  3:34 PM  Signed 60 year old female with hx of tubular adenoma in 2007, as well as family history of colon cancer (sister). She has no rectal bleeding or change in bowel habits. She denies abdominal  pain. Her only issue is with alternating constipation and diarrhea, which she states is chronic. She is currently not on a bowel regimen. We will proceed with a high fiber diet, Miralax if constipation, probiotic. Colonoscopy in near future.   Proceed with TCS with Dr. Jena Gauss in near future: the risks, benefits, and alternatives have been discussed with the patient in detail. The patient states understanding and desires to proceed.            Not recorded            Orders Placed This Encounter     Procedural/ Surgical Case Request: COLONOSCOPY  [SUR1 Custom]         Patient Instructions     We have set you up for a colonoscopy with Dr. Jena Gauss.   Start taking a Probiotic daily and incorporate fiber into your diet. Please see the handout.   Further recommendations after colonoscopy is completed    I have seen the patient prior to the procedure(s) today and reviewed the history and physical / consultation from 09/07/11.  There have been no changes. After consideration of the risks, benefits, alternatives and imponderables, the patient has consented to the procedure(s).

## 2011-10-11 ENCOUNTER — Telehealth: Payer: Self-pay | Admitting: Family Medicine

## 2011-10-11 ENCOUNTER — Encounter (HOSPITAL_COMMUNITY): Payer: Self-pay | Admitting: Internal Medicine

## 2011-10-12 NOTE — Telephone Encounter (Signed)
Patient has picked up medicine

## 2011-10-12 NOTE — Telephone Encounter (Signed)
2 refills were authorized on the august script she needs to check the pharmacy

## 2011-10-16 ENCOUNTER — Encounter: Payer: Self-pay | Admitting: Internal Medicine

## 2011-11-13 ENCOUNTER — Encounter: Payer: Self-pay | Admitting: Family Medicine

## 2011-11-15 ENCOUNTER — Other Ambulatory Visit: Payer: Self-pay | Admitting: Family Medicine

## 2011-11-15 DIAGNOSIS — Z139 Encounter for screening, unspecified: Secondary | ICD-10-CM

## 2011-11-17 LAB — LIPID PANEL
HDL: 55 mg/dL (ref >39)
LDL Cholesterol: 134 mg/dL — ABNORMAL HIGH (ref 0–99)
Triglycerides: 90 mg/dL (ref <150)
VLDL: 18 mg/dL (ref 0–40)

## 2011-11-23 ENCOUNTER — Encounter: Payer: Self-pay | Admitting: Family Medicine

## 2011-11-23 ENCOUNTER — Ambulatory Visit (INDEPENDENT_AMBULATORY_CARE_PROVIDER_SITE_OTHER): Payer: Federal, State, Local not specified - PPO | Admitting: Family Medicine

## 2011-11-23 ENCOUNTER — Ambulatory Visit (HOSPITAL_COMMUNITY)
Admission: RE | Admit: 2011-11-23 | Discharge: 2011-11-23 | Disposition: A | Discharge status: Home / Self Care | Payer: Federal, State, Local not specified - PPO | Source: Ambulatory Visit | Attending: Family Medicine | Admitting: Family Medicine

## 2011-11-23 VITALS — BP 114/66 | HR 93 | Resp 18 | Ht 63.5 in | Wt 183.1 lb

## 2011-11-23 DIAGNOSIS — E669 Obesity, unspecified: Secondary | ICD-10-CM

## 2011-11-23 DIAGNOSIS — R0989 Other specified symptoms and signs involving the circulatory and respiratory systems: Secondary | ICD-10-CM | POA: Insufficient documentation

## 2011-11-23 DIAGNOSIS — Z1231 Encounter for screening mammogram for malignant neoplasm of breast: Secondary | ICD-10-CM | POA: Insufficient documentation

## 2011-11-23 DIAGNOSIS — M542 Cervicalgia: Secondary | ICD-10-CM

## 2011-11-23 DIAGNOSIS — R5383 Other fatigue: Secondary | ICD-10-CM

## 2011-11-23 DIAGNOSIS — E785 Hyperlipidemia, unspecified: Secondary | ICD-10-CM

## 2011-11-23 DIAGNOSIS — Z139 Encounter for screening, unspecified: Secondary | ICD-10-CM

## 2011-11-23 DIAGNOSIS — Z23 Encounter for immunization: Secondary | ICD-10-CM

## 2011-11-23 DIAGNOSIS — R5381 Other malaise: Secondary | ICD-10-CM

## 2011-11-23 DIAGNOSIS — R7301 Impaired fasting glucose: Secondary | ICD-10-CM

## 2011-11-23 DIAGNOSIS — E049 Nontoxic goiter, unspecified: Secondary | ICD-10-CM

## 2011-11-23 MED ORDER — METHYLPREDNISOLONE ACETATE PF 80 MG/ML IJ SUSP
80.0000 mg | Freq: Once | INTRAMUSCULAR | Status: AC
  Administered 2011-11-23: 80 mg via INTRAMUSCULAR

## 2011-11-23 MED ORDER — PREDNISONE 10 MG PO TABS: 10.0000 mg | ORAL_TABLET | Freq: Two times a day (BID) | ORAL | Status: AC

## 2011-11-23 NOTE — Progress Notes (Signed)
  Subjective:    Patient ID: Stacey Smith, female    DOB: 23-Jun-1951, 60 y.o.   MRN: [id number]  HPI 3 week h/o left facial pain and numbness, alsoextends to nape  And left shoulder. No weakness or numbness . First episode states several days ago had an episode of less than 10 mins when she was unable to read withboth her glasses and contacts on. Has had dental eval which is normal. Shee has established arthritis in th e neck with nerve compression. No commitment to regular exercise, uses phentermine sporadically, no change in diet, non compliant with lipid lowering medication   Review of Systems See HPI Denies recent fever or chills. Denies sinus pressure, nasal congestion, ear pain or sore throat. Denies chest congestion, productive cough or wheezing. Denies chest pains, palpitations and leg swelling Denies abdominal pain, nausea, vomiting,diarrhea or constipation.   Denies dysuria, frequency, hesitancy or incontinence. Denies joint pain, swelling and limitation in mobility. Denies headaches, seizures,  Denies depression, anxiety or insomnia. Denies skin break down or rash.        Objective:   Physical Exam Patient alert and oriented and in no cardiopulmonary distress.  HEENT: No facial asymmetry, EOMI, no sinus tenderness,  oropharynx pink and moist.  Neck supple no adenopathy.  Chest: Clear to auscultation bilaterally.  CVS: S1, S2 no murmurs, no S3.  ABD: Soft non tender. Bowel sounds normal.  Ext: No edema  MS: Adequate ROM spine, shoulders, hips and knees.  Skin: Intact, no ulcerations or rash noted.  Psych: Good eye contact, normal affect. Memory intact not anxious or depressed appearing.  CNS: CN 2-12 intact, power, tone  normal throughout.        Assessment & Plan:

## 2011-11-23 NOTE — Assessment & Plan Note (Signed)
Increased symptoms with radiculopathy x 3 weeks, depo medrol and prednisone

## 2011-11-23 NOTE — Patient Instructions (Signed)
F/u in 4 months.  The left facial and neck numbness is from arthritis with nerve compression I believe. You will get a depo medrol injection in the office , and prednisone is also prescribed. Please call if symptoms persist, you will be referred to a neurologist.  You are referred for evaluation of blood flow in neck arteries, also thyroid ultrasound.  Your cholesterol is too high, pls take the pravastatin which has been prescribed.  Flu vaccine today.  HBA1c, chem 7, lipid and hepatin in 4 months, just before return

## 2011-11-23 NOTE — Assessment & Plan Note (Signed)
Unchanged, continue metformin

## 2011-11-23 NOTE — Assessment & Plan Note (Signed)
Uncontrolled, pt to start pravachol which she already has

## 2011-11-25 NOTE — Assessment & Plan Note (Signed)
Unchanged. Patient re-educated about  the importance of commitment to a  minimum of 150 minutes of exercise per week. The importance of healthy food choices with portion control discussed. Encouraged to start a food diary, count calories and to consider  joining a support group. Sample diet sheets offered. Goals set by the patient for the next several months.    

## 2011-11-28 ENCOUNTER — Ambulatory Visit (HOSPITAL_COMMUNITY)
Admission: RE | Admit: 2011-11-28 | Discharge: 2011-11-28 | Disposition: A | Discharge status: Home / Self Care | Payer: Federal, State, Local not specified - PPO | Source: Ambulatory Visit | Attending: Family Medicine | Admitting: Family Medicine

## 2011-11-28 DIAGNOSIS — I6529 Occlusion and stenosis of unspecified carotid artery: Secondary | ICD-10-CM | POA: Insufficient documentation

## 2011-11-28 DIAGNOSIS — E049 Nontoxic goiter, unspecified: Secondary | ICD-10-CM

## 2011-11-28 DIAGNOSIS — R209 Unspecified disturbances of skin sensation: Secondary | ICD-10-CM | POA: Insufficient documentation

## 2011-11-28 DIAGNOSIS — R0989 Other specified symptoms and signs involving the circulatory and respiratory systems: Secondary | ICD-10-CM

## 2012-01-08 ENCOUNTER — Other Ambulatory Visit: Payer: Self-pay | Admitting: Family Medicine

## 2012-01-08 ENCOUNTER — Encounter: Payer: Self-pay | Admitting: Family Medicine

## 2012-01-08 ENCOUNTER — Ambulatory Visit (INDEPENDENT_AMBULATORY_CARE_PROVIDER_SITE_OTHER): Payer: Federal, State, Local not specified - PPO | Admitting: Family Medicine

## 2012-01-08 ENCOUNTER — Telehealth: Payer: Self-pay | Admitting: Family Medicine

## 2012-01-08 VITALS — BP 118/80 | HR 84 | Temp 98.0°F | Resp 16 | Ht 63.5 in | Wt 184.0 lb

## 2012-01-08 DIAGNOSIS — E785 Hyperlipidemia, unspecified: Secondary | ICD-10-CM

## 2012-01-08 DIAGNOSIS — R5381 Other malaise: Secondary | ICD-10-CM

## 2012-01-08 DIAGNOSIS — R7301 Impaired fasting glucose: Secondary | ICD-10-CM

## 2012-01-08 DIAGNOSIS — J209 Acute bronchitis, unspecified: Secondary | ICD-10-CM

## 2012-01-08 DIAGNOSIS — H9202 Otalgia, left ear: Secondary | ICD-10-CM

## 2012-01-08 DIAGNOSIS — M26649 Arthritis of unspecified temporomandibular joint: Secondary | ICD-10-CM

## 2012-01-08 DIAGNOSIS — E669 Obesity, unspecified: Secondary | ICD-10-CM

## 2012-01-08 DIAGNOSIS — M2669 Other specified disorders of temporomandibular joint: Secondary | ICD-10-CM

## 2012-01-08 DIAGNOSIS — H9209 Otalgia, unspecified ear: Secondary | ICD-10-CM

## 2012-01-08 DIAGNOSIS — R5383 Other fatigue: Secondary | ICD-10-CM

## 2012-01-08 MED ORDER — BENZONATATE 100 MG PO CAPS: 100.0000 mg | ORAL_CAPSULE | Freq: Four times a day (QID) | ORAL | Status: DC | PRN

## 2012-01-08 MED ORDER — IBUPROFEN 800 MG PO TABS: 800.0000 mg | ORAL_TABLET | Freq: Three times a day (TID) | ORAL | Status: DC | PRN

## 2012-01-08 MED ORDER — CEFTRIAXONE SODIUM 500 MG IJ SOLR
500.0000 mg | Freq: Once | INTRAMUSCULAR | Status: AC
  Administered 2012-01-08: 500 mg via INTRAMUSCULAR

## 2012-01-08 MED ORDER — PENICILLIN V POTASSIUM 500 MG PO TABS: 500.0000 mg | ORAL_TABLET | Freq: Three times a day (TID) | ORAL | Status: AC

## 2012-01-08 NOTE — Assessment & Plan Note (Signed)
Ongoing pain for several months with trismus, will have ENT eval. I believe this is tMJ,m ear exam is nl

## 2012-01-08 NOTE — Telephone Encounter (Signed)
pls give work in appt today if possible, or by tomorrow

## 2012-01-08 NOTE — Assessment & Plan Note (Signed)
Deteriorated. Patient re-educated about  the importance of commitment to a  minimum of 150 minutes of exercise per week. The importance of healthy food choices with portion control discussed. Encouraged to start a food diary, count calories and to consider  joining a support group. Sample diet sheets offered. Goals set by the patient for the next several months.    

## 2012-01-08 NOTE — Assessment & Plan Note (Signed)
Needs updated HBa1C will add to lab today

## 2012-01-08 NOTE — Telephone Encounter (Signed)
Starting some chills, throat very sore, sneezing and nasal congestion. Sharp pains down in left ear. Having some difficulty getting her mouth open to chew on that left side. Offered appt with [facility], said no. Wants something called in or to be seen by Lodema Hong

## 2012-01-08 NOTE — Assessment & Plan Note (Signed)
Hyperlipidemia:Low fat diet discussed and encouraged.  Updated labs needed 

## 2012-01-08 NOTE — Assessment & Plan Note (Signed)
Acute symptom onset, cbc and diff, rocephin and penicillin with tessalon perles

## 2012-01-08 NOTE — Assessment & Plan Note (Signed)
Ongoing problem for months with difficulty opening hte mouth and jaw pain, some response to anti inflammatories. Will have ent eval left ear pain and also order xray of left jaw

## 2012-01-08 NOTE — Patient Instructions (Signed)
F/u as before.  You are being treated for bronchitis and pharyngitis,Rocephin in the office today , and medication has also been sent in.  You are refeerred to ENT for chronnic ear pain.  CBC and diff today

## 2012-01-08 NOTE — Progress Notes (Signed)
  Subjective:    Patient ID: Stacey Smith, female    DOB: Sep 23, 1951, 61 y.o.   MRN: [id number]  HPI Persistent left ear pain and pressure for months, with difficulty opening her mouth. 1 day h/o sore throat, chills, body sches, excessive sneezing and cough. No known sick contact. Generalized body aches also 2 weeks ago had multiple "bug bites" and was on septra through urgent care. No improvement in weight, and no regular exercise  Review of Systems See HPI Denies chest pains, palpitations and leg swelling Denies abdominal pain, nausea, vomiting,diarrhea or constipation.   Denies dysuria, frequency, hesitancy or incontinence. Denies joint pain, swelling and limitation in mobility. Denies headaches, seizures, numbness, or tingling. Denies depression, anxiety or insomnia.       Objective:   Physical Exam Patient alert and oriented and in no cardiopulmonary distress.  HEENT: No facial asymmetry, EOMI, no sinus tenderness,  Oropharynx limited exam due to pain with opening the mouth.  Neck supple no adenopathy.TM normal  Chest: decreased air entry scattered crackles no wheezes CVS: S1, S2 no murmurs, no S3.  ABD: Soft non tender. Bowel sounds normal.  Ext: No edema  MS: Adequate ROM spine, shoulders, hips and knees.  Skin: Intact,scars from recent insect bites on both forearms, not erythematous and no drainage.  Psych: Good eye contact, normal affect. Memory intact not anxious or depressed appearing.  CNS: CN 2-12 intact, power, tone and sensation normal throughout.        Assessment & Plan:

## 2012-01-09 LAB — CBC WITH DIFFERENTIAL/PLATELET
Eosinophils Absolute: 0.3 10*3/uL (ref 0.0–0.7)
Eosinophils Relative: 3 % (ref 0–5)
HCT: 38 % (ref 36.0–46.0)
Hemoglobin: 11.6 g/dL — ABNORMAL LOW (ref 12.0–15.0)
Lymphs Abs: 2.9 10*3/uL (ref 0.7–4.0)
MCH: 23 pg — ABNORMAL LOW (ref 26.0–34.0)
MCV: 75.4 fL — ABNORMAL LOW (ref 78.0–100.0)
Monocytes Absolute: 0.7 10*3/uL (ref 0.1–1.0)
Monocytes Relative: 8 % (ref 3–12)
Platelets: 274 10*3/uL (ref 150–400)
RBC: 5.04 MIL/uL (ref 3.87–5.11)

## 2012-01-11 ENCOUNTER — Ambulatory Visit (INDEPENDENT_AMBULATORY_CARE_PROVIDER_SITE_OTHER): Payer: Federal, State, Local not specified - PPO | Admitting: Otolaryngology

## 2012-01-11 DIAGNOSIS — H6120 Impacted cerumen, unspecified ear: Secondary | ICD-10-CM

## 2012-01-11 DIAGNOSIS — H9209 Otalgia, unspecified ear: Secondary | ICD-10-CM

## 2012-01-15 ENCOUNTER — Telehealth: Payer: Self-pay | Admitting: Family Medicine

## 2012-01-15 NOTE — Telephone Encounter (Signed)
Patient aware of her labs 

## 2012-02-08 ENCOUNTER — Ambulatory Visit (INDEPENDENT_AMBULATORY_CARE_PROVIDER_SITE_OTHER): Payer: Federal, State, Local not specified - PPO | Admitting: Otolaryngology

## 2012-02-08 DIAGNOSIS — H9209 Otalgia, unspecified ear: Secondary | ICD-10-CM

## 2012-02-12 ENCOUNTER — Other Ambulatory Visit (INDEPENDENT_AMBULATORY_CARE_PROVIDER_SITE_OTHER): Payer: Self-pay | Admitting: Otolaryngology

## 2012-02-12 DIAGNOSIS — R6884 Jaw pain: Secondary | ICD-10-CM

## 2012-02-14 ENCOUNTER — Ambulatory Visit (HOSPITAL_COMMUNITY)
Admission: RE | Admit: 2012-02-14 | Discharge: 2012-02-14 | Disposition: A | Discharge status: Home / Self Care | Payer: Federal, State, Local not specified - PPO | Source: Ambulatory Visit | Attending: Otolaryngology | Admitting: Otolaryngology

## 2012-02-14 DIAGNOSIS — R6884 Jaw pain: Secondary | ICD-10-CM

## 2012-03-06 ENCOUNTER — Other Ambulatory Visit: Payer: Self-pay | Admitting: Family Medicine

## 2012-03-07 NOTE — Telephone Encounter (Signed)
Do you want to refill phentermine? 

## 2012-03-07 NOTE — Telephone Encounter (Signed)
pls refill x 1 only remind her keep April appt if you spk with her

## 2012-03-08 NOTE — Telephone Encounter (Signed)
Refilled x1 

## 2012-03-20 ENCOUNTER — Telehealth: Payer: Self-pay | Admitting: Family Medicine

## 2012-03-21 NOTE — Telephone Encounter (Signed)
Message left for pt to have labs done that was sent to lab

## 2012-03-23 ENCOUNTER — Other Ambulatory Visit: Payer: Self-pay | Admitting: Family Medicine

## 2012-03-23 LAB — LIPID PANEL
Cholesterol: 196 mg/dL (ref 0–200)
HDL: 58 mg/dL (ref >39)
Triglycerides: 55 mg/dL (ref <150)

## 2012-03-23 LAB — CMP AND LIVER
Albumin: 4.1 g/dL (ref 3.5–5.2)
BUN: 18 mg/dL (ref 6–23)
CO2: 30 mEq/L (ref 19–32)
Calcium: 9.3 mg/dL (ref 8.4–10.5)
Chloride: 104 mEq/L (ref 96–112)
Glucose, Bld: 100 mg/dL — ABNORMAL HIGH (ref 70–99)
Indirect Bilirubin: 0.3 mg/dL (ref 0.0–0.9)
Potassium: 4.4 mEq/L (ref 3.5–5.3)
Total Protein: 6.7 g/dL (ref 6.0–8.3)

## 2012-03-23 LAB — HEMOGLOBIN A1C: Hgb A1c MFr Bld: 6.1 % — ABNORMAL HIGH (ref <5.7)

## 2012-03-25 ENCOUNTER — Ambulatory Visit (INDEPENDENT_AMBULATORY_CARE_PROVIDER_SITE_OTHER): Payer: Federal, State, Local not specified - PPO | Admitting: Family Medicine

## 2012-03-25 ENCOUNTER — Encounter: Payer: Self-pay | Admitting: Family Medicine

## 2012-03-25 VITALS — BP 118/74 | HR 87 | Resp 18 | Ht 63.5 in | Wt 188.1 lb

## 2012-03-25 DIAGNOSIS — M26649 Arthritis of unspecified temporomandibular joint: Secondary | ICD-10-CM

## 2012-03-25 DIAGNOSIS — M2669 Other specified disorders of temporomandibular joint: Secondary | ICD-10-CM

## 2012-03-25 DIAGNOSIS — E049 Nontoxic goiter, unspecified: Secondary | ICD-10-CM

## 2012-03-25 DIAGNOSIS — H9202 Otalgia, left ear: Secondary | ICD-10-CM

## 2012-03-25 DIAGNOSIS — E669 Obesity, unspecified: Secondary | ICD-10-CM

## 2012-03-25 DIAGNOSIS — H9209 Otalgia, unspecified ear: Secondary | ICD-10-CM

## 2012-03-25 DIAGNOSIS — E785 Hyperlipidemia, unspecified: Secondary | ICD-10-CM

## 2012-03-25 DIAGNOSIS — R7301 Impaired fasting glucose: Secondary | ICD-10-CM

## 2012-03-25 MED ORDER — PHENTERMINE HCL 37.5 MG PO CAPS: 37.5000 mg | ORAL_CAPSULE | ORAL | Status: DC

## 2012-03-25 MED ORDER — IBUPROFEN 800 MG PO TABS: 800.0000 mg | ORAL_TABLET | Freq: Three times a day (TID) | ORAL | Status: DC | PRN

## 2012-03-25 NOTE — Assessment & Plan Note (Signed)
Ongoing left jaw pain rated at an 8 at times, abnormal MRI of tMJ, will refer for oral surgeon eval. Anti inflammatories and cool compresses in the interim

## 2012-03-25 NOTE — Assessment & Plan Note (Signed)
Deteriorated. Patient re-educated about  the importance of commitment to a  minimum of 150 minutes of exercise per week. The importance of healthy food choices with portion control discussed. Encouraged to start a food diary, count calories and to consider  joining a support group. Sample diet sheets offered. Goals set by the patient for the next several months.    

## 2012-03-25 NOTE — Patient Instructions (Addendum)
F/u mid July   HBA1C  And TSh in mid July, non fasting  It is important that you exercise regularly at least 30 minutes 5 times a week. If you develop chest pain, have severe difficulty breathing, or feel very tired, stop exercising immediately and seek medical attention    A healthy diet is rich in fruit, vegetables and whole grains. Poultry fish, nuts and beans are a healthy choice for protein rather then red meat. A low sodium diet and drinking 64 ounces of water daily is generally recommended. Oils and sweet should be limited. Carbohydrates especially for those who are diabetic or overweight, should be limited to 30-45 gram per meal. It is important to eat on a regular schedule, at least 3 times daily. Snacks should be primarily fruits, vegetables or nuts.  Cholesterol has improved slightly, weight and blood sugar have deteriorated. You need to work more consistently on this.  Weight loss needs to be at LEAST 6 pounds till you return to continue the phentermine  You are referred to oral surgery re left jaw pain

## 2012-03-25 NOTE — Assessment & Plan Note (Signed)
Slightly improved, pt intolerant and non compliant with statins

## 2012-03-25 NOTE — Progress Notes (Signed)
  Subjective:    Patient ID: Stacey Smith, female    DOB: 19-May-1951, 61 y.o.   MRN: [id number]  HPI The PT is here for follow up and re-evaluation of chronic medical conditions, medication management and review of any available recent lab and radiology data.  Preventive health is updated, specifically  Cancer screening and Immunization.   Questions or concerns regarding consultations or procedures which the PT has had in the interim are  addressed. The PT denies any adverse reactions to current medications since the last visit.  There are no new concerns.  C/o persistent and ongoing jaw pain, she has seen ENt and requests oral surgery eval since pain ios often an 8. No regular exercise, no consistent change in eating habits    Review of Systems See HPI Denies recent fever or chills. Denies sinus pressure, nasal congestion, ear pain or sore throat. Denies chest congestion, productive cough or wheezing. Denies chest pains, palpitations and leg swelling Denies abdominal pain, nausea, vomiting,diarrhea or constipation.   Denies dysuria, frequency, hesitancy or incontinence.  Denies headaches, seizures, numbness, or tingling. Denies depression, anxiety or insomnia. Denies skin break down or rash.        Objective:   Physical Exam Patient alert and oriented and in no cardiopulmonary distress.  HEENT: No facial asymmetry, EOMI, no sinus tenderness,  oropharynx pink and moist.  Neck supple no adenopathy.Teder over left TMJ, mild limitation in ability to fully open mouth  Chest: Clear to auscultation bilaterally.  CVS: S1, S2 no murmurs, no S3.  ABD: Soft non tender. Bowel sounds normal.  Ext: No edema  MS: Adequate ROM spine, shoulders, hips and knees.  Skin: Intact, no ulcerations or rash noted.  Psych: Good eye contact, normal affect. Memory intact not anxious or depressed appearing.  CNS: CN 2-12 intact, power, tone and sensation normal throughout.         Assessment & Plan:

## 2012-03-25 NOTE — Assessment & Plan Note (Signed)
Deteriorated, HBa1C increased, pt needs to be much more consistent with lifestyle changes needed to improve this

## 2012-05-29 ENCOUNTER — Other Ambulatory Visit: Payer: Self-pay | Admitting: Family Medicine

## 2012-05-30 ENCOUNTER — Telehealth: Payer: Self-pay | Admitting: Family Medicine

## 2012-05-30 NOTE — Telephone Encounter (Signed)
This has already been done 05/29/2012

## 2012-05-31 ENCOUNTER — Telehealth: Payer: Self-pay | Admitting: Family Medicine

## 2012-05-31 NOTE — Telephone Encounter (Signed)
yes

## 2012-07-11 ENCOUNTER — Telehealth: Payer: Self-pay | Admitting: Family Medicine

## 2012-07-11 NOTE — Telephone Encounter (Signed)
Pt labs printed and awaiting pick up by pt

## 2012-07-12 LAB — HEMOGLOBIN A1C
Hgb A1c MFr Bld: 5.9 % — ABNORMAL HIGH (ref <5.7)
Mean Plasma Glucose: 123 mg/dL — ABNORMAL HIGH (ref <117)

## 2012-07-15 ENCOUNTER — Encounter: Payer: Self-pay | Admitting: Family Medicine

## 2012-07-15 ENCOUNTER — Ambulatory Visit (INDEPENDENT_AMBULATORY_CARE_PROVIDER_SITE_OTHER): Payer: Federal, State, Local not specified - PPO | Admitting: Family Medicine

## 2012-07-15 VITALS — BP 124/76 | HR 78 | Resp 18 | Ht 63.5 in | Wt 189.1 lb

## 2012-07-15 DIAGNOSIS — M26649 Arthritis of unspecified temporomandibular joint: Secondary | ICD-10-CM

## 2012-07-15 DIAGNOSIS — E049 Nontoxic goiter, unspecified: Secondary | ICD-10-CM

## 2012-07-15 DIAGNOSIS — M2669 Other specified disorders of temporomandibular joint: Secondary | ICD-10-CM

## 2012-07-15 DIAGNOSIS — R7301 Impaired fasting glucose: Secondary | ICD-10-CM

## 2012-07-15 DIAGNOSIS — J301 Allergic rhinitis due to pollen: Secondary | ICD-10-CM

## 2012-07-15 DIAGNOSIS — Z13820 Encounter for screening for osteoporosis: Secondary | ICD-10-CM

## 2012-07-15 DIAGNOSIS — E785 Hyperlipidemia, unspecified: Secondary | ICD-10-CM

## 2012-07-15 NOTE — Patient Instructions (Addendum)
Annual exam in January week of the 22nd.Call if you need me before  Please call for flu vaccine in October.  All the best with upcoming left jaw surgery.  You need to take calcium with D daily.  You are referred for a bone density test.  Aspirin 81mg  one daily is recommended for stroke risk reduction  Blood sugar is improved, congrats  Fasting cbc, chem 7, lipid, vit D and HBa1c [name] 14 or after  It is important that you exercise regularly at least 30 minutes 6 times a week. If you develop chest pain, have severe difficulty breathing, or feel very tired, stop exercising immediately and seek medical attention   Happy birthday next month

## 2012-07-15 NOTE — Progress Notes (Signed)
  Subjective:    Patient ID: Stacey Smith, female    DOB: 1951-11-10, 61 y.o.   MRN: [id number]  HPI The PT is here for follow up and re-evaluation of chronic medical conditions, medication management and review of any available recent lab and radiology data.  Preventive health is updated, specifically  Cancer screening and Immunization.   Questions or concerns regarding consultations or procedures which the PT has had in the interim are  Addressed.Had procedure in May for left TMJ, little improvement,has second more extensive surgery planned The PT denies any adverse reactions to current medications since the last visit.  There are no new concerns.  There are no specific complaints      Review of Systems See HPI Denies recent fever or chills. Denies sinus pressure, nasal congestion, ear pain or sore throat. Denies chest congestion, productive cough or wheezing. Denies chest pains, palpitations and leg swelling Denies abdominal pain, nausea, vomiting,diarrhea or constipation.   Denies dysuria, frequency, hesitancy or incontinence. . Denies headaches, seizures, numbness, or tingling. Denies depression, anxiety or insomnia. Denies skin break down or rash.        Objective:   Physical Exam  Patient alert and oriented and in no cardiopulmonary distress.  HEENT: No facial asymmetry, EOMI, no sinus tenderness,  oropharynx pink and moist.  Neck supple no adenopathy.Tender over TMJ with limitation in ability to open mouth  Chest: Clear to auscultation bilaterally.  CVS: S1, S2 no murmurs, no S3.  ABD: Soft non tender. Bowel sounds normal.  Ext: No edema  MS: Adequate ROM spine, shoulders, hips and knees.  Skin: Intact, no ulcerations or rash noted.  Psych: Good eye contact, normal affect. Memory intact not anxious or depressed appearing.  CNS: CN 2-12 intact, power, tone and sensation normal throughout.       Assessment & Plan:

## 2012-07-25 ENCOUNTER — Ambulatory Visit (HOSPITAL_COMMUNITY)
Admission: RE | Admit: 2012-07-25 | Discharge: 2012-07-25 | Disposition: A | Discharge status: Home / Self Care | Payer: Federal, State, Local not specified - PPO | Source: Ambulatory Visit | Attending: Family Medicine | Admitting: Family Medicine

## 2012-07-25 DIAGNOSIS — Z13820 Encounter for screening for osteoporosis: Secondary | ICD-10-CM | POA: Insufficient documentation

## 2012-07-25 DIAGNOSIS — Z9079 Acquired absence of other genital organ(s): Secondary | ICD-10-CM | POA: Insufficient documentation

## 2012-07-25 DIAGNOSIS — Z90710 Acquired absence of both cervix and uterus: Secondary | ICD-10-CM | POA: Insufficient documentation

## 2012-07-25 DIAGNOSIS — Z780 Asymptomatic menopausal state: Secondary | ICD-10-CM | POA: Insufficient documentation

## 2012-07-30 ENCOUNTER — Encounter: Payer: Self-pay | Admitting: Family Medicine

## 2012-07-30 ENCOUNTER — Ambulatory Visit (INDEPENDENT_AMBULATORY_CARE_PROVIDER_SITE_OTHER): Payer: Federal, State, Local not specified - PPO | Admitting: Family Medicine

## 2012-07-30 VITALS — BP 124/78 | HR 90 | Resp 18 | Ht 63.5 in | Wt 189.0 lb

## 2012-07-30 DIAGNOSIS — T7840XA Allergy, unspecified, initial encounter: Secondary | ICD-10-CM

## 2012-07-30 DIAGNOSIS — L03119 Cellulitis of unspecified part of limb: Secondary | ICD-10-CM

## 2012-07-30 DIAGNOSIS — L02619 Cutaneous abscess of unspecified foot: Secondary | ICD-10-CM

## 2012-07-30 MED ORDER — METHYLPREDNISOLONE ACETATE 80 MG/ML IJ SUSP
80.0000 mg | Freq: Once | INTRAMUSCULAR | Status: AC
Start: 2012-07-30 — End: 2012-07-30
  Administered 2012-07-30: 80 mg via INTRAMUSCULAR

## 2012-07-30 MED ORDER — CEPHALEXIN 500 MG PO CAPS: 500.0000 mg | ORAL_CAPSULE | Freq: Four times a day (QID) | ORAL | Status: AC

## 2012-07-30 MED ORDER — PREDNISONE (PAK) 5 MG PO TABS: 5.0000 mg | ORAL_TABLET | ORAL | Status: DC

## 2012-07-30 NOTE — Progress Notes (Signed)
  Subjective:    Patient ID: Stacey Smith, female    DOB: 1951/02/24, 61 y.o.   MRN: [id number]  HPI 4 day h/o pruritic red rash on both feet, started wearing new shoes 3 days before. Area of concern for bacterial infection with weeping and drainage. No fever or chills. Denies any new medication, food or toiletries. No wheeziing or difficulty swallowing   Review of Systems See HPI Denies recent fever or chills. Denies sinus pressure, nasal congestion, ear pain or sore throat. Denies chest congestion, productive cough or wheezing. Denies chest pains, palpitations and leg swelling  Denies skin break down or rash.        Objective:   Physical Exam  Patient alert and oriented and in no cardiopulmonary distress.  HEENT: No facial asymmetry, EOMI, no sinus tenderness,  oropharynx pink and moist.  Neck supple no adenopathy.  Chest: Clear to auscultation bilaterally.  CVS: S1, S2 no murmurs, no S3.  ABD: Soft non tender. Bowel sounds normal.  Ext: No edema  MS: Adequate ROM spine, shoulders, hips and knees.  Skin: Erythematous papular rash on dorsum and soles of both feet, pustular appearing lesion on sole of left foot  Psych: Good eye contact, normal affect. Memory intact not anxious or depressed appearing.  .       Assessment & Plan:

## 2012-07-30 NOTE — Patient Instructions (Addendum)
F/u as before.  You are having an allergic reaction to new footwear, apparently.  DO nOT SCRATCH, to reduce risk of bacterial infection.  PLEASE stay off of feet, to reduce trauma , for the next 2 days.  Depo medrol in office and prednisone dose pack  Use benadryl 1 tablet  3 times daily for the next 2 days. Antibiotic prescribed due to area of concern on left sole for super infection.  Work excuse to return 8./07/2012

## 2012-07-30 NOTE — Assessment & Plan Note (Signed)
Apparent allergic reaction to new footwear, steroids and antihistamine. Antibiotic coverage for apparent superinfection. Work excuse. Avoid new  shoes

## 2012-08-03 NOTE — Assessment & Plan Note (Signed)
Improved Patient educated about the importance of limiting  Carbohydrate intake , the need to commit to daily physical activity for a minimum of 30 minutes , and to commit weight loss. The fact that changes in all these areas will reduce or eliminate all together the development of diabetes is stressed.    

## 2012-08-03 NOTE — Assessment & Plan Note (Signed)
Uncontrolled surgery is being proposed, pt not keen

## 2012-08-03 NOTE — Assessment & Plan Note (Signed)
Controlled, no change in medication  

## 2012-08-03 NOTE — Assessment & Plan Note (Signed)
Hyperlipidemia:Low fat diet discussed and encouraged.  Dietary management only at this time 

## 2012-08-07 ENCOUNTER — Encounter: Payer: Self-pay | Admitting: Family Medicine

## 2012-08-15 ENCOUNTER — Other Ambulatory Visit (HOSPITAL_COMMUNITY): Payer: Self-pay | Admitting: Oral Surgery

## 2012-09-18 ENCOUNTER — Encounter (HOSPITAL_COMMUNITY): Payer: Self-pay

## 2012-09-18 ENCOUNTER — Encounter (HOSPITAL_COMMUNITY)
Admission: RE | Admit: 2012-09-18 | Discharge: 2012-09-18 | Disposition: A | Discharge status: Home / Self Care | Payer: Federal, State, Local not specified - PPO | Source: Ambulatory Visit | Attending: Oral Surgery | Admitting: Oral Surgery

## 2012-09-18 HISTORY — DX: Dislocation of jaw, unspecified side, initial encounter: S03.00XA

## 2012-09-18 HISTORY — DX: Headache: R51

## 2012-09-18 LAB — CBC
HCT: 35.2 % — ABNORMAL LOW (ref 36.0–46.0)
MCH: 23.4 pg — ABNORMAL LOW (ref 26.0–34.0)
MCV: 74.1 fL — ABNORMAL LOW (ref 78.0–100.0)
Platelets: 268 10*3/uL (ref 150–400)
RDW: 15.4 % (ref 11.5–15.5)
WBC: 9.3 10*3/uL (ref 4.0–10.5)

## 2012-09-18 LAB — BASIC METABOLIC PANEL
BUN: 21 mg/dL (ref 6–23)
CO2: 26 mEq/L (ref 19–32)
Calcium: 9.6 mg/dL (ref 8.4–10.5)
Chloride: 104 mEq/L (ref 96–112)
Creatinine, Ser: 0.95 mg/dL (ref 0.50–1.10)
Glucose, Bld: 92 mg/dL (ref 70–99)

## 2012-09-18 NOTE — Pre-Procedure Instructions (Signed)
20 Stacey Smith  09/18/2012   Your procedure is scheduled on:  09/23/12  Report to [facility] at 530 AM.  Call this number if you have problems the morning of surgery: [phone]   Remember:     Take these medicines the morning of surgery with A SIP OF WATER: none Do not eat food:After Midnight.     Do not wear jewelry, make-up or nail polish.  Do not wear lotions, powders, or perfumes. You may wear deodorant.  Do not shave 48 hours prior to surgery. Men may shave face and neck.  Do not bring valuables to the hospital.  Contacts, dentures or bridgework may not be worn into surgery.  Leave suitcase in the car. After surgery it may be brought to your room.  For patients admitted to the hospital, checkout time is 11:00 AM the day of discharge.   Patients discharged the day of surgery will not be allowed to drive home.  Name and phone number of your driver: family  Special Instructions: Shower using CHG 2 nights before surgery and the night before surgery.  If you shower the day of surgery use CHG.  Use special wash - you have one bottle of CHG for all showers.  You should use approximately 1/3 of the bottle for each shower.   Please read over the following fact sheets that you were given: Pain Booklet, Coughing and Deep Breathing, MRSA Information and Surgical Site Infection Prevention

## 2012-09-23 ENCOUNTER — Ambulatory Visit (HOSPITAL_COMMUNITY)
Admission: RE | Admit: 2012-09-23 | Discharge: 2012-09-23 | Disposition: A | Discharge status: Home / Self Care | Payer: Federal, State, Local not specified - PPO | Source: Ambulatory Visit | Attending: Oral Surgery | Admitting: Oral Surgery

## 2012-09-23 ENCOUNTER — Encounter (HOSPITAL_COMMUNITY)
Admission: RE | Disposition: A | Discharge status: Home / Self Care | Payer: Self-pay | Source: Ambulatory Visit | Attending: Oral Surgery

## 2012-09-23 ENCOUNTER — Ambulatory Visit (HOSPITAL_COMMUNITY): Payer: Federal, State, Local not specified - PPO | Admitting: Anesthesiology

## 2012-09-23 ENCOUNTER — Encounter (HOSPITAL_COMMUNITY): Payer: Self-pay | Admitting: Anesthesiology

## 2012-09-23 ENCOUNTER — Encounter (HOSPITAL_COMMUNITY): Payer: Self-pay | Admitting: *Deleted

## 2012-09-23 DIAGNOSIS — D369 Benign neoplasm, unspecified site: Secondary | ICD-10-CM

## 2012-09-23 DIAGNOSIS — H9202 Otalgia, left ear: Secondary | ICD-10-CM

## 2012-09-23 DIAGNOSIS — M79609 Pain in unspecified limb: Secondary | ICD-10-CM

## 2012-09-23 DIAGNOSIS — J301 Allergic rhinitis due to pollen: Secondary | ICD-10-CM

## 2012-09-23 DIAGNOSIS — R5383 Other fatigue: Secondary | ICD-10-CM

## 2012-09-23 DIAGNOSIS — K219 Gastro-esophageal reflux disease without esophagitis: Secondary | ICD-10-CM

## 2012-09-23 DIAGNOSIS — M26649 Arthritis of unspecified temporomandibular joint: Secondary | ICD-10-CM

## 2012-09-23 DIAGNOSIS — R7301 Impaired fasting glucose: Secondary | ICD-10-CM

## 2012-09-23 DIAGNOSIS — Z01810 Encounter for preprocedural cardiovascular examination: Secondary | ICD-10-CM | POA: Insufficient documentation

## 2012-09-23 DIAGNOSIS — M1710 Unilateral primary osteoarthritis, unspecified knee: Secondary | ICD-10-CM

## 2012-09-23 DIAGNOSIS — Z01812 Encounter for preprocedural laboratory examination: Secondary | ICD-10-CM | POA: Insufficient documentation

## 2012-09-23 DIAGNOSIS — R109 Unspecified abdominal pain: Secondary | ICD-10-CM

## 2012-09-23 DIAGNOSIS — E785 Hyperlipidemia, unspecified: Secondary | ICD-10-CM

## 2012-09-23 DIAGNOSIS — M26639 Articular disc disorder of temporomandibular joint, unspecified side: Secondary | ICD-10-CM

## 2012-09-23 DIAGNOSIS — E049 Nontoxic goiter, unspecified: Secondary | ICD-10-CM

## 2012-09-23 DIAGNOSIS — E669 Obesity, unspecified: Secondary | ICD-10-CM

## 2012-09-23 DIAGNOSIS — R0989 Other specified symptoms and signs involving the circulatory and respiratory systems: Secondary | ICD-10-CM

## 2012-09-23 DIAGNOSIS — IMO0002 Reserved for concepts with insufficient information to code with codable children: Secondary | ICD-10-CM

## 2012-09-23 DIAGNOSIS — L02619 Cutaneous abscess of unspecified foot: Secondary | ICD-10-CM

## 2012-09-23 DIAGNOSIS — M542 Cervicalgia: Secondary | ICD-10-CM

## 2012-09-23 DIAGNOSIS — R5381 Other malaise: Secondary | ICD-10-CM

## 2012-09-23 DIAGNOSIS — T7840XA Allergy, unspecified, initial encounter: Secondary | ICD-10-CM

## 2012-09-23 DIAGNOSIS — L03119 Cellulitis of unspecified part of limb: Secondary | ICD-10-CM

## 2012-09-23 DIAGNOSIS — M2669 Other specified disorders of temporomandibular joint: Secondary | ICD-10-CM | POA: Insufficient documentation

## 2012-09-23 HISTORY — PX: TMJ ARTHROPLASTY: SHX1066

## 2012-09-23 SURGERY — ARTHROPLASTY, TMJ
Anesthesia: General | Site: Face | Laterality: Left | Wound class: Clean Contaminated

## 2012-09-23 MED ORDER — HYDROMORPHONE HCL PF 1 MG/ML IJ SOLN: 0.2500 mg | INTRAMUSCULAR | Status: DC | PRN

## 2012-09-23 MED ORDER — OXYCODONE-ACETAMINOPHEN 5-325 MG PO TABS
1.0000 | ORAL_TABLET | ORAL | Status: DC | PRN
Start: 2012-09-23 — End: 2013-05-14

## 2012-09-23 MED ORDER — CEFAZOLIN SODIUM-DEXTROSE 2-3 GM-% IV SOLR
INTRAVENOUS | Status: DC | PRN
  Administered 2012-09-23: 2 g via INTRAVENOUS

## 2012-09-23 MED ORDER — CEFAZOLIN SODIUM-DEXTROSE 2-3 GM-% IV SOLR
INTRAVENOUS | Status: AC
  Filled 2012-09-23: qty 50

## 2012-09-23 MED ORDER — FENTANYL CITRATE 0.05 MG/ML IJ SOLN
INTRAMUSCULAR | Status: DC | PRN
  Administered 2012-09-23: 100 ug via INTRAVENOUS

## 2012-09-23 MED ORDER — ONDANSETRON HCL 4 MG/2ML IJ SOLN: 4.0000 mg | Freq: Once | INTRAMUSCULAR | Status: DC | PRN

## 2012-09-23 MED ORDER — PHENYLEPHRINE HCL 0.5 % NA SOLN
NASAL | Status: DC | PRN
  Administered 2012-09-23: 2 [drp] via NASAL

## 2012-09-23 MED ORDER — ROCURONIUM BROMIDE 100 MG/10ML IV SOLN
INTRAVENOUS | Status: DC | PRN
  Administered 2012-09-23: 50 mg via INTRAVENOUS
  Administered 2012-09-23: 10 mg via INTRAVENOUS

## 2012-09-23 MED ORDER — MIDAZOLAM HCL 5 MG/5ML IJ SOLN
INTRAMUSCULAR | Status: DC | PRN
  Administered 2012-09-23: 2 mg via INTRAVENOUS

## 2012-09-23 MED ORDER — OXYMETAZOLINE HCL 0.05 % NA SOLN
NASAL | Status: DC | PRN
  Administered 2012-09-23: 1 via NASAL

## 2012-09-23 MED ORDER — OXYCODONE HCL 5 MG PO TABS: 5.0000 mg | ORAL_TABLET | Freq: Once | ORAL | Status: DC | PRN

## 2012-09-23 MED ORDER — LIDOCAINE-EPINEPHRINE 2 %-1:100000 IJ SOLN
INTRAMUSCULAR | Status: DC | PRN
  Administered 2012-09-23: 10 mL

## 2012-09-23 MED ORDER — LACTATED RINGERS IV SOLN
INTRAVENOUS | Status: DC | PRN
  Administered 2012-09-23 (×2): via INTRAVENOUS

## 2012-09-23 MED ORDER — BACITRACIN ZINC 500 UNIT/GM EX OINT
TOPICAL_OINTMENT | CUTANEOUS | Status: AC
  Filled 2012-09-23: qty 15

## 2012-09-23 MED ORDER — ONDANSETRON HCL 4 MG/5ML PO SOLN: 4.0000 mg | Freq: Two times a day (BID) | ORAL | Status: DC | PRN

## 2012-09-23 MED ORDER — LIDOCAINE HCL (CARDIAC) 20 MG/ML IV SOLN
INTRAVENOUS | Status: DC | PRN
  Administered 2012-09-23: 75 mg via INTRAVENOUS

## 2012-09-23 MED ORDER — 0.9 % SODIUM CHLORIDE (POUR BTL) OPTIME
TOPICAL | Status: DC | PRN
  Administered 2012-09-23: 1000 mL

## 2012-09-23 MED ORDER — LIDOCAINE-EPINEPHRINE 2 %-1:100000 IJ SOLN
INTRAMUSCULAR | Status: AC
  Filled 2012-09-23: qty 1

## 2012-09-23 MED ORDER — ONDANSETRON HCL 4 MG/2ML IJ SOLN
INTRAMUSCULAR | Status: DC | PRN
  Administered 2012-09-23: 4 mg via INTRAVENOUS

## 2012-09-23 MED ORDER — PROPOFOL 10 MG/ML IV BOLUS
INTRAVENOUS | Status: DC | PRN
  Administered 2012-09-23: 200 mg via INTRAVENOUS

## 2012-09-23 MED ORDER — BACITRACIN ZINC 500 UNIT/GM EX OINT
TOPICAL_OINTMENT | CUTANEOUS | Status: DC | PRN
  Administered 2012-09-23: 1 via TOPICAL

## 2012-09-23 MED ORDER — OXYMETAZOLINE HCL 0.05 % NA SOLN
NASAL | Status: AC
  Filled 2012-09-23: qty 15

## 2012-09-23 MED ORDER — OXYCODONE HCL 5 MG/5ML PO SOLN: 5.0000 mg | Freq: Once | ORAL | Status: DC | PRN

## 2012-09-23 SURGICAL SUPPLY — 58 items
APPLICATOR COTTON TIP 6IN STRL (MISCELLANEOUS) ×2 IMPLANT
BANDAGE ELASTIC 4 VELCRO ST LF (GAUZE/BANDAGES/DRESSINGS) ×2 IMPLANT
BANDAGE GAUZE ELAST BULKY 4 IN (GAUZE/BANDAGES/DRESSINGS) ×2 IMPLANT
BLADE MINI 60D BLUE (BLADE) ×3 IMPLANT
BLADE SCLERAL 3.0 60 BEV DOWN (BLADE) ×2 IMPLANT
BLADE SURG 15 STRL LF DISP TIS (BLADE) IMPLANT
BLADE SURG 15 STRL SS (BLADE)
BUR CROSS CUT (BURR)
BUR CROSS CUT FISSURE 1.6 (BURR) IMPLANT
BUR DIAMOND COARSE 2.0 (BURR) ×2 IMPLANT
BUR EGG ELITE 4.0 (BURR) IMPLANT
BUR SRG MED 1.6XXCUT FSSR (BURR) IMPLANT
BUR SURG 4X8 MED (BURR) IMPLANT
BURR SRG MED 1.6XXCUT FSSR (BURR)
BURR SURG 4X8 MED (BURR)
CANISTER SUCTION 2500CC (MISCELLANEOUS) ×3 IMPLANT
CLEANER TIP ELECTROSURG 2X2 (MISCELLANEOUS) ×3 IMPLANT
CLOTH BEACON ORANGE TIMEOUT ST (SAFETY) ×3 IMPLANT
COVER SURGICAL LIGHT HANDLE (MISCELLANEOUS) ×3 IMPLANT
DECANTER SPIKE VIAL GLASS SM (MISCELLANEOUS) IMPLANT
DRAPE SURG 17X23 STRL (DRAPES) ×4 IMPLANT
ELECT COATED BLADE 2.86 ST (ELECTRODE) ×3 IMPLANT
GAUZE PACKING FOLDED 2  STR (GAUZE/BANDAGES/DRESSINGS) ×1
GAUZE PACKING FOLDED 2 STR (GAUZE/BANDAGES/DRESSINGS) ×2 IMPLANT
GAUZE SPONGE 4X4 16PLY XRAY LF (GAUZE/BANDAGES/DRESSINGS) ×2 IMPLANT
GLOVE BIO SURGEON STRL SZ 6.5 (GLOVE) ×7 IMPLANT
GLOVE BIO SURGEON STRL SZ7 (GLOVE) IMPLANT
GLOVE BIO SURGEON STRL SZ7.5 (GLOVE) ×6 IMPLANT
GLOVE BIOGEL PI IND STRL 6.5 (GLOVE) ×1 IMPLANT
GLOVE BIOGEL PI IND STRL 7.0 (GLOVE) ×2 IMPLANT
GLOVE BIOGEL PI INDICATOR 6.5 (GLOVE) ×1
GLOVE BIOGEL PI INDICATOR 7.0 (GLOVE) ×1
GLOVE SURG SS PI 6.5 STRL IVOR (GLOVE) ×2 IMPLANT
GOWN STRL NON-REIN LRG LVL3 (GOWN DISPOSABLE) ×6 IMPLANT
GOWN STRL REIN XL XLG (GOWN DISPOSABLE) ×3 IMPLANT
KIT BASIN OR (CUSTOM PROCEDURE TRAY) ×3 IMPLANT
KIT ROOM TURNOVER OR (KITS) ×3 IMPLANT
NDL BLUNT 16X1.5 OR ONLY (NEEDLE) IMPLANT
NEEDLE 22X1 1/2 (OR ONLY) (NEEDLE) ×3 IMPLANT
NEEDLE BLUNT 16X1.5 OR ONLY (NEEDLE) IMPLANT
NS IRRIG 1000ML POUR BTL (IV SOLUTION) ×3 IMPLANT
PAD ARMBOARD 7.5X6 YLW CONV (MISCELLANEOUS) ×6 IMPLANT
PENCIL BUTTON HOLSTER BLD 10FT (ELECTRODE) ×2 IMPLANT
SPONGE GAUZE 4X4 12PLY (GAUZE/BANDAGES/DRESSINGS) ×2 IMPLANT
SUT CHROMIC 3 0 PS 2 (SUTURE) ×2 IMPLANT
SUT MERSILENE 4 0 P 3 (SUTURE) ×2 IMPLANT
SUT PROLENE 5 0 P 3 (SUTURE) ×2 IMPLANT
SUT SILK 3 0 (SUTURE) ×3
SUT SILK 3-0 18XBRD TIE 12 (SUTURE) ×1 IMPLANT
SUT VIC AB 4-0 PS2 27 (SUTURE) ×2 IMPLANT
SYR 50ML SLIP (SYRINGE) IMPLANT
SYR CONTROL 10ML LL (SYRINGE) ×2 IMPLANT
TOWEL OR 17X24 6PK STRL BLUE (TOWEL DISPOSABLE) IMPLANT
TOWEL OR 17X26 10 PK STRL BLUE (TOWEL DISPOSABLE) ×3 IMPLANT
TRAY ENT MC OR (CUSTOM PROCEDURE TRAY) ×3 IMPLANT
TUBING IRRIGATION (MISCELLANEOUS) IMPLANT
WATER STERILE IRR 1000ML POUR (IV SOLUTION) ×3 IMPLANT
YANKAUER SUCT BULB TIP NO VENT (SUCTIONS) ×3 IMPLANT

## 2012-09-23 NOTE — Transfer of Care (Signed)
Immediate Anesthesia Transfer of Care Note  Patient: Stacey Smith  Procedure(s) Performed: Procedure(s) (LRB) with comments: TEMPOROMANDIBULAR JOINT (TMJ) ARTHROPLASTY (Left) - Temporomandibular joint arthrotomy and meniscectomy  Patient Location: PACU  Anesthesia Type: General  Level of Consciousness: sedated  Airway & Oxygen Therapy: Patient Spontanous Breathing and Patient connected to nasal cannula oxygen  Post-op Assessment: Report given to PACU RN and Post -op Vital signs reviewed and stable  Post vital signs: Reviewed  Complications: No apparent anesthesia complications

## 2012-09-23 NOTE — Anesthesia Procedure Notes (Signed)
Procedure Name: Intubation Date/Time: 09/23/2012 7:54 AM Performed by: Gwenyth Allegra Pre-anesthesia Checklist: Emergency Drugs available, Patient identified, Timeout performed, Suction available and Patient being monitored Patient Re-evaluated:Patient Re-evaluated prior to inductionOxygen Delivery Method: Circle system utilized Preoxygenation: Pre-oxygenation with 100% oxygen Intubation Type: IV induction Ventilation: Mask ventilation without difficulty and Oral airway inserted - appropriate to patient size Nasal Tubes: Right, Nasal Rae, Nasal prep performed and Magill forceps- large, utilized Tube size: 7.0 mm Number of attempts: 1 Airway Equipment and Method: Video-laryngoscopy Placement Confirmation: ETT inserted through vocal cords under direct vision,  breath sounds checked- equal and bilateral and positive ETCO2 Tube secured with: Tape Dental Injury: Teeth and Oropharynx as per pre-operative assessment  Difficulty Due To: Difficulty was anticipated and Difficult Airway- due to limited oral opening

## 2012-09-23 NOTE — Anesthesia Postprocedure Evaluation (Signed)
  Anesthesia Post-op Note  Patient: Stacey Smith  Procedure(s) Performed: Procedure(s) (LRB) with comments: TEMPOROMANDIBULAR JOINT (TMJ) ARTHROPLASTY (Left) - Temporomandibular joint arthrotomy and meniscectomy  Patient Location: PACU  Anesthesia Type: General  Level of Consciousness: awake, alert  and oriented  Airway and Oxygen Therapy: Patient Spontanous Breathing and Patient connected to nasal cannula oxygen  Post-op Pain: mild  Post-op Assessment: Post-op Vital signs reviewed  Post-op Vital Signs: Reviewed  Complications: No apparent anesthesia complications

## 2012-09-23 NOTE — Op Note (Signed)
09/23/2012  9:22 AM  PATIENT:  Stacey Smith  61 y.o. female  PRE-OPERATIVE DIAGNOSIS:  DJD Left TMJ, Anterior disc displacement Left TMJ  POST-OPERATIVE DIAGNOSIS:  SAME  PROCEDURE:  Procedure(s): LEFT TEMPOROMANDIBULAR JOINT (TMJ) ARTHROTOMY, MENISCECTOMY  SURGEON:  Surgeon(s): Georgia Lopes, DDS  ANESTHESIA:   local and general  EBL:  minimal  DRAINS: none   SPECIMEN:  Left TMJ DIsc  COUNTS:  YES  PLAN OF CARE: Discharge to home after PACU  PATIENT DISPOSITION:  PACU - hemodynamically stable.   PROCEDURE DETAILS: Dictation # [id number]  Georgia Lopes, DMD 09/23/2012 9:22 AM

## 2012-09-23 NOTE — Preoperative (Signed)
Beta Blockers   Reason not to administer Beta Blockers:Not Applicable 

## 2012-09-23 NOTE — Anesthesia Preprocedure Evaluation (Addendum)
Anesthesia Evaluation  Patient identified by MRN, date of birth, ID band Patient awake    Reviewed: Allergy & Precautions, H&P , NPO status , Patient's Chart, lab work & pertinent test results  Airway Mallampati: I TM Distance: >3 FB Neck ROM: Full  Mouth opening: Limited Mouth Opening  Dental  (+) Teeth Intact and Dental Advisory Given   Pulmonary  breath sounds clear to auscultation        Cardiovascular Rhythm:Regular Rate:Normal     Neuro/Psych    GI/Hepatic GERD-  Medicated and Controlled,  Endo/Other    Renal/GU      Musculoskeletal   Abdominal   Peds  Hematology   Anesthesia Other Findings   Reproductive/Obstetrics                          Anesthesia Physical Anesthesia Plan  ASA: III  Anesthesia Plan:    Post-op Pain Management:    Induction: Intravenous  Airway Management Planned: Nasal ETT and Oral ETT  Additional Equipment:   Intra-op Plan:   Post-operative Plan: Extubation in OR  Informed Consent: I have reviewed the patients History and Physical, chart, labs and discussed the procedure including the risks, benefits and alternatives for the proposed anesthesia with the patient or authorized representative who has indicated his/her understanding and acceptance.   Dental advisory given  Plan Discussed with: CRNA, Anesthesiologist and Surgeon  Anesthesia Plan Comments:         Anesthesia Quick Evaluation

## 2012-09-23 NOTE — Op Note (Signed)
Stacey Smith, Stacey Smith                ACCOUNT NO.:  [id number]  MEDICAL RECORD NO.:  [id number]  LOCATION:  [facility]                         FACILITY:  [facility]  PHYSICIAN:  Georgia Lopes, M.D.  DATE OF BIRTH:  [birth year]  DATE OF PROCEDURE:  09/23/2012 DATE OF DISCHARGE:  09/23/2012                              OPERATIVE REPORT   PREOPERATIVE DIAGNOSES:  Left temporomandibular joint anterior disc displacement, degenerative joint disease.  PROCEDURE:  Left temporomandibular joint arthrotomy, meniscectomy.  SURGEON:  Georgia Lopes, MD  ANESTHESIA:  Dr. Ivin Booty, nasal intubation.  INDICATIONS FOR PROCEDURES:  Stacey Smith is a 61 year old female, who is referred to me by Ear, Nose, and Throat Dr. Suszanne Conners for evaluation of left TMJ pain, which have been present for approximately 6 months.  She denied injury, but did have significant decrease in mouth opening.  Had clicking and popping, no locking.  She denied any history of bruxism or clenching.  MRI demonstrated possible perforation of the TMJ with disc displacement.  Arthrocentesis was performed in the office under intravenous sedation on May 02, 2012, but the patient continued to have pain and limited opening.  Options were discussed including no treatment, repeat arthrocentesis, TMJ splint, open joint surgery.  The patient elected to have open joint surgery with possible meniscectomy.  PROCEDURE:  The patient was taken to the operating room, placed on the table in supine position.  General anesthesia was administered intravenously and a nasal endotracheal tube was placed and secured.  The eyes were protected. The patient's left temporal region was shaved prior to prepping.   The table was turned and The patient was draped for the procedure.   Then, a time-out was performed. The mouth was opened and measured and maximum opening was  29mm.   A sterile marking pen was then used after draping to demarcate the line of the incision which was a  preauricular approach  With extension into the temporal region curving anteriorly in the superior portion.  Then, 2% lidocaine 1:100,000 epinephrine was infiltrated along the line of incision and into the joint space and then a 15 blade was used to make a full- thickness incision through skin and subcutaneous tissue along the line of the previously marked incision.  Dissection was performed in the temporal region with blunt hemostat until the superficial layer of deep temporal fascia was observed.  This was used as a plane of dissection using the hemostat and Bovie cautery throughout the length of the incision.  Bleeding vessels were cauterized with the Bovie electrocautery.  Larger vein was ligated and sutured with 3-0 silk.  The dissection was carried anteriorly over the zygomatic arch.  A mandibular clamp was placed at the inferior border of the mandible at the angle to allow for distraction of the jaw inferiorly and then the jaw was palpated through the incision site and the condyle was noted.  A 15 blade was used to make an incision horizontally over the TMJ capsule. Capsule was entered and a Therapist, nutritional was used to open up the joint so that the disc could be identified.  There was a medial perforation noted and the disc appeared to be anteriorly displaced.  Intraoperative maximum mouth opening at this point was around 3mm even with paralytic agents.  It was therefore determined that a discectomy would best increase opening and relieve pain.  Then, the tenotomy scissors and curved Beaver blade were used to remove the disc.  Small medial disc fragments were removed with pituitary rongeurs.  Then the mouth was opened and closed and found to have opening of 40 mm. Incision area was then irrigated and closed with 4-0 Vicryl, 3-0 chromic, and 5-0 Prolene on the skin.  Bacitracin, fluffs, Kerlix, and Ace wrap were placed.  The patient was awakened, extubated, taken to the recovery room,  breathing spontaneously in good condition.  ESTIMATED BLOOD LOSS:  Minimum.  COMPLICATIONS:  None.  SPECIMEN:  Left TMJ disc.     Georgia Lopes, M.D.     SMJ/MEDQ  D:  09/23/2012  T:  09/23/2012  Job:  [id number]

## 2012-09-23 NOTE — H&P (Signed)
HISTORY AND PHYSICAL  Stacey Smith is a 61 y.o. female patient with CC: Left TMJ pain, clicking, popping, difficulty eating and chewing.  HPI: Multiple year history of painful opening and closing of jaw. MRI significant for TMJ disc displacement.  No diagnosis found.  Past Medical History  Diagnosis Date  . GERD (gastroesophageal reflux disease)   . Obesity   . Hyperlipidemia   . S/P colonoscopy Oct 2007    Normal rectum, diminutive polyps in the left colon cold: TUBULAR ADENOMA  . Back pain   . Borderline diabetes   . Colon polyps   . TMJ (dislocation of temporomandibular joint)   . Headache     No current facility-administered medications for this encounter.   No Known Allergies Active Problems:  * No active hospital problems. *   Vitals: Blood pressure 131/76, pulse 75, temperature 97.6 F (36.4 C), temperature source Oral, resp. rate 20, SpO2 99.00%. Lab results:No results found for this or any previous visit (from the past 24 hour(s)). Radiology Results: No results found. General appearance: alert, cooperative and no distress Head: Normocephalic, without obvious abnormality, atraumatic Eyes: conjunctivae/corneas clear. PERRL, EOM's intact. Fundi benign. Ears: normal TM's and external ear canals both ears Nose: Nares normal. Septum midline. Mucosa normal. No drainage or sinus tenderness. Throat: lips, mucosa, and tongue normal; teeth and gums normal and Bilateral clicking and popping of TMJ Neck: no adenopathy, supple, symmetrical, trachea midline and thyroid not enlarged, symmetric, no tenderness/mass/nodules Resp: clear to auscultation bilaterally Cardio: regular rate and rhythm, S1, S2 normal, no murmur, click, rub or gallop GI: soft, non-tender; bowel sounds normal; no masses,  no organomegaly  Assessment: 61YO BF GERD, Obesity with TMJ internal derrangement. Anterior disc dislocation without reduction   Plan: Left TMJ arthrotomy, meniscectomy or partial  meniscectomy, possible arthroplasty, possible left temporalis fascia graft. General anesthesia, day surgery.   Denisa Enterline M 09/23/2012

## 2012-09-24 ENCOUNTER — Encounter (HOSPITAL_COMMUNITY): Payer: Self-pay | Admitting: Oral Surgery

## 2012-12-05 ENCOUNTER — Other Ambulatory Visit: Payer: Self-pay | Admitting: Family Medicine

## 2012-12-05 DIAGNOSIS — Z139 Encounter for screening, unspecified: Secondary | ICD-10-CM

## 2012-12-10 ENCOUNTER — Ambulatory Visit (HOSPITAL_COMMUNITY): Payer: Federal, State, Local not specified - PPO

## 2012-12-19 ENCOUNTER — Ambulatory Visit (HOSPITAL_COMMUNITY)
Admission: RE | Admit: 2012-12-19 | Discharge: 2012-12-19 | Disposition: A | Discharge status: Home / Self Care | Payer: Federal, State, Local not specified - PPO | Source: Ambulatory Visit | Attending: Family Medicine | Admitting: Family Medicine

## 2012-12-19 DIAGNOSIS — Z1231 Encounter for screening mammogram for malignant neoplasm of breast: Secondary | ICD-10-CM | POA: Insufficient documentation

## 2012-12-19 DIAGNOSIS — Z139 Encounter for screening, unspecified: Secondary | ICD-10-CM

## 2013-01-16 ENCOUNTER — Other Ambulatory Visit (HOSPITAL_COMMUNITY)
Admission: RE | Admit: 2013-01-16 | Discharge: 2013-01-16 | Disposition: A | Discharge status: Home / Self Care | Payer: Federal, State, Local not specified - PPO | Source: Ambulatory Visit | Attending: Family Medicine | Admitting: Family Medicine

## 2013-01-16 ENCOUNTER — Telehealth: Payer: Self-pay | Admitting: Family Medicine

## 2013-01-16 ENCOUNTER — Ambulatory Visit (INDEPENDENT_AMBULATORY_CARE_PROVIDER_SITE_OTHER): Payer: Federal, State, Local not specified - PPO | Admitting: Family Medicine

## 2013-01-16 ENCOUNTER — Encounter: Payer: Self-pay | Admitting: Family Medicine

## 2013-01-16 VITALS — BP 108/78 | HR 78 | Resp 16 | Ht 63.5 in | Wt 191.0 lb

## 2013-01-16 DIAGNOSIS — Z124 Encounter for screening for malignant neoplasm of cervix: Secondary | ICD-10-CM

## 2013-01-16 DIAGNOSIS — E785 Hyperlipidemia, unspecified: Secondary | ICD-10-CM

## 2013-01-16 DIAGNOSIS — D539 Nutritional anemia, unspecified: Secondary | ICD-10-CM

## 2013-01-16 DIAGNOSIS — Z1211 Encounter for screening for malignant neoplasm of colon: Secondary | ICD-10-CM

## 2013-01-16 DIAGNOSIS — Z01419 Encounter for gynecological examination (general) (routine) without abnormal findings: Secondary | ICD-10-CM | POA: Insufficient documentation

## 2013-01-16 DIAGNOSIS — Z23 Encounter for immunization: Secondary | ICD-10-CM

## 2013-01-16 DIAGNOSIS — Z2911 Encounter for prophylactic immunotherapy for respiratory syncytial virus (RSV): Secondary | ICD-10-CM

## 2013-01-16 DIAGNOSIS — R7301 Impaired fasting glucose: Secondary | ICD-10-CM

## 2013-01-16 DIAGNOSIS — R5381 Other malaise: Secondary | ICD-10-CM

## 2013-01-16 DIAGNOSIS — M549 Dorsalgia, unspecified: Secondary | ICD-10-CM

## 2013-01-16 DIAGNOSIS — R5383 Other fatigue: Secondary | ICD-10-CM

## 2013-01-16 DIAGNOSIS — M5416 Radiculopathy, lumbar region: Secondary | ICD-10-CM | POA: Insufficient documentation

## 2013-01-16 DIAGNOSIS — Z0000 Encounter for general adult medical examination without abnormal findings: Secondary | ICD-10-CM

## 2013-01-16 DIAGNOSIS — E669 Obesity, unspecified: Secondary | ICD-10-CM

## 2013-01-16 DIAGNOSIS — Z1151 Encounter for screening for human papillomavirus (HPV): Secondary | ICD-10-CM | POA: Insufficient documentation

## 2013-01-16 LAB — HEMOGLOBIN A1C
Hgb A1c MFr Bld: 6.3 % — ABNORMAL HIGH (ref <5.7)
Mean Plasma Glucose: 134 mg/dL — ABNORMAL HIGH (ref <117)

## 2013-01-16 MED ORDER — FLUTICASONE PROPIONATE 50 MCG/ACT NA SUSP: 2.0000 | Freq: Every day | NASAL | Status: DC | PRN

## 2013-01-16 MED ORDER — METHYLPREDNISOLONE ACETATE 80 MG/ML IJ SUSP
80.0000 mg | Freq: Once | INTRAMUSCULAR | Status: AC
  Administered 2013-01-16: 80 mg via INTRAMUSCULAR

## 2013-01-16 MED ORDER — KETOROLAC TROMETHAMINE 60 MG/2ML IM SOLN
60.0000 mg | Freq: Once | INTRAMUSCULAR | Status: AC
  Administered 2013-01-16: 60 mg via INTRAMUSCULAR

## 2013-01-16 NOTE — Telephone Encounter (Signed)
Made another new call

## 2013-01-16 NOTE — Telephone Encounter (Signed)
Sent in

## 2013-01-16 NOTE — Progress Notes (Signed)
  Subjective:    Patient ID: Stacey Smith, female    DOB: 1951-06-09, 62 y.o.   MRN: [id number]  HPI The PT is here for annual exam and re-evaluation of chronic medical conditions, medication management and review of any available recent lab and radiology data.  Preventive health is updated, specifically  Cancer screening and Immunization.   Questions or concerns regarding consultations or procedures which the PT has had in the interim are  Addressed.Since her last visit she has had left TMJ surgery and is disappointed with still not being "pain free" and having difficulty opening her mouth The PT denies any adverse reactions to current medications since the last visit.  There are no new concerns. Intends to start exercise and work on dietary change so that she does lose weight    Review of Systems See HPI Denies recent fever or chills. Denies sinus pressure, nasal congestion, ear pain or sore throat. Denies chest congestion, productive cough or wheezing. Denies chest pains, palpitations and leg swelling Denies abdominal pain, nausea, vomiting,diarrhea or constipation.   Denies dysuria, frequency, hesitancy or incontinence.  Denies headaches, seizures, numbness, or tingling. Denies depression, anxiety or insomnia. Denies skin break down or rash.        Objective:   Physical Exam  Pleasant well nourished female, alert and oriented x 3, in no cardio-pulmonary distress. Afebrile. HEENT No facial trauma or asymetry. Sinuses non tender.  EOMI, PERTL, fundoscopic exam  no hemorhage or exudate.  External ears normal, tympanic membranes clear. Oropharynx moist, no exudate, good dentition. Neck: decreased ROM with spasm, no adenopathy,JVD or thyromegaly.No bruits.  Chest: Clear to ascultation bilaterally.No crackles or wheezes. Non tender to palpation  Breast: No asymetry,no masses. No nipple discharge or inversion. No axillary or supraclavicular adenopathy  Cardiovascular  system; Heart sounds normal,  S1 and  S2 ,no S3.  No murmur, or thrill. Apical beat not displaced Peripheral pulses normal.  Abdomen: Soft, non tender, no organomegaly or masses. No bruits. Bowel sounds normal. No guarding, tenderness or rebound.  Rectal:  No mass. Guaiac negative stool.  GU: External genitalia normal. No lesions. Vaginal canal normal.Physiologic  discharge. Uterus absent, no adnexal masses, no  adnexal tenderness.  Musculoskeletal exam: Decreased ROM of lumbar  Spine,adequate in  hips , shoulders and knees. No deformity ,swelling or crepitus noted. No muscle wasting or atrophy.   Neurologic: Cranial nerves 2 to 12 intact. Power, tone ,sensation and reflexes normal throughout. No disturbance in gait. No tremor.  Skin: Intact, no ulceration, erythema , scaling or rash noted. Pigmentation normal throughout  Psych; Normal mood and affect. Judgement and concentration normal       Assessment & Plan:

## 2013-01-16 NOTE — Patient Instructions (Addendum)
F/u in 4.5 month  HBA1C today.  Fasting cbc, cmp, HBA1C and lipid , iron and B12 in 4.5 month  Toradol 60mg  and depo medrol 80mg  IM today for back pain.  Zostavax today.  You are referred for therapy for chronic neck and back pain twice weekly for 6 weeks  Please get the flu vaccine at your pharmacy  I hope you continue to improve  It is important that you exercise regularly at least 30 minutes 5 times a week. If you develop chest pain, have severe difficulty breathing, or feel very tired, stop exercising immediately and seek medical attention   A healthy diet is rich in fruit, vegetables and whole grains. Poultry fish, nuts and beans are a healthy choice for protein rather then red meat. A low sodium diet and drinking 64 ounces of water daily is generally recommended. Oils and sweet should be limited. Carbohydrates especially for those who are diabetic or overweight, should be limited to 04-45 gram per meal. It is important to eat on a regular schedule, at least 3 times daily. Snacks should be primarily fruits, vegetables or nuts.

## 2013-01-21 ENCOUNTER — Ambulatory Visit (HOSPITAL_COMMUNITY)
Admission: RE | Admit: 2013-01-21 | Discharge: 2013-01-21 | Disposition: A | Discharge status: Home / Self Care | Payer: Federal, State, Local not specified - PPO | Source: Ambulatory Visit | Attending: Family Medicine | Admitting: Family Medicine

## 2013-01-21 ENCOUNTER — Encounter: Payer: Self-pay | Admitting: Family Medicine

## 2013-01-21 ENCOUNTER — Ambulatory Visit (INDEPENDENT_AMBULATORY_CARE_PROVIDER_SITE_OTHER): Payer: Federal, State, Local not specified - PPO | Admitting: Family Medicine

## 2013-01-21 VITALS — BP 128/80 | HR 69 | Resp 18 | Ht 63.5 in

## 2013-01-21 DIAGNOSIS — R1012 Left upper quadrant pain: Secondary | ICD-10-CM | POA: Insufficient documentation

## 2013-01-21 DIAGNOSIS — R918 Other nonspecific abnormal finding of lung field: Secondary | ICD-10-CM | POA: Insufficient documentation

## 2013-01-21 DIAGNOSIS — K529 Noninfective gastroenteritis and colitis, unspecified: Secondary | ICD-10-CM

## 2013-01-21 DIAGNOSIS — M26649 Arthritis of unspecified temporomandibular joint: Secondary | ICD-10-CM

## 2013-01-21 DIAGNOSIS — Z1211 Encounter for screening for malignant neoplasm of colon: Secondary | ICD-10-CM

## 2013-01-21 DIAGNOSIS — M2669 Other specified disorders of temporomandibular joint: Secondary | ICD-10-CM

## 2013-01-21 DIAGNOSIS — K8020 Calculus of gallbladder without cholecystitis without obstruction: Secondary | ICD-10-CM | POA: Insufficient documentation

## 2013-01-21 DIAGNOSIS — K5289 Other specified noninfective gastroenteritis and colitis: Secondary | ICD-10-CM

## 2013-01-21 LAB — HEPATIC FUNCTION PANEL
AST: 15 U/L (ref 0–37)
Bilirubin, Direct: 0.1 mg/dL (ref 0.0–0.3)
Total Bilirubin: 0.3 mg/dL (ref 0.3–1.2)

## 2013-01-21 LAB — BASIC METABOLIC PANEL
CO2: 29 mEq/L (ref 19–32)
Calcium: 9.7 mg/dL (ref 8.4–10.5)
Chloride: 102 mEq/L (ref 96–112)
Sodium: 138 mEq/L (ref 135–145)

## 2013-01-21 LAB — CBC WITH DIFFERENTIAL/PLATELET
Eosinophils Absolute: 0.2 10*3/uL (ref 0.0–0.7)
Lymphocytes Relative: 38 % (ref 12–46)
Lymphs Abs: 3 10*3/uL (ref 0.7–4.0)
Neutrophils Relative %: 52 % (ref 43–77)
Platelets: 266 10*3/uL (ref 150–400)
RBC: 4.97 MIL/uL (ref 3.87–5.11)
WBC: 7.8 10*3/uL (ref 4.0–10.5)

## 2013-01-21 MED ORDER — ONDANSETRON HCL 4 MG PO TABS: 4.0000 mg | ORAL_TABLET | Freq: Every day | ORAL | Status: DC | PRN

## 2013-01-21 MED ORDER — DICYCLOMINE HCL 10 MG PO CAPS: 10.0000 mg | ORAL_CAPSULE | Freq: Three times a day (TID) | ORAL | Status: DC

## 2013-01-21 MED ORDER — IOHEXOL 300 MG/ML  SOLN
100.0000 mL | Freq: Once | INTRAMUSCULAR | Status: AC | PRN
  Administered 2013-01-21: 100 mL via INTRAVENOUS

## 2013-01-21 MED ORDER — DIPHENOXYLATE-ATROPINE 2.5-0.025 MG PO TABS: 1.0000 | ORAL_TABLET | Freq: Four times a day (QID) | ORAL | Status: DC | PRN

## 2013-01-21 NOTE — Progress Notes (Signed)
  Subjective:    Patient ID: Stacey Smith, female    DOB: [birth year], 62 y.o.   MRN: [id number]  HPI Pt states she woke up on Thursda night with cramping abdominal and bloating. Poor appetite, nausea, , no emesis, excessive loose watery stools, On Sunday explosion on herself. Last night  Cramping , not as watery, no blood or mucus in the stool, no fever or chills, however , unable to "get comfortable " due to pain, left work today due to symptoms. No other family member or close associate similarly affected Denies urinary symptoms or respiratory symptoms   Review of Systems See HPI Denies recent fever or chills. Denies sinus pressure, nasal congestion, ear pain or sore throat. Denies chest congestion, productive cough or wheezing. Denies chest pains, palpitations and leg swelling  Denies dysuria, frequency, hesitancy or incontinence. Denies joint pain, swelling and limitation in mobility. Denies headaches, seizures, numbness, or tingling. Denies depression, anxiety or insomnia. Denies skin break down or rash.        Objective:   Physical Exam Patient alert and oriented and in no cardiopulmonary distress.Ill appearing, in pain  HEENT: No facial asymmetry, EOMI, no sinus tenderness,  oropharynx pink and moist.  Neck supple no adenopathy.  Chest: Clear to auscultation bilaterally.  CVS: S1, S2 no murmurs, no S3.  ABD: Soft diffuse tenderness, most marked in rUQ, no guarding or rebound. Hyperactive bowel sounds, no palpable organomegaly or masses Rectal, soft formed brown stool, heme negative Ext: No edema  MS: Adequate ROM spine, shoulders, hips and knees.  Skin: Intact, no ulcerations or rash noted.  Psych: Good eye contact, anxious due to acute illness  CNS: CN 2-12 intact, power, tone and sensation normal throughout.        Assessment & Plan:

## 2013-01-21 NOTE — Patient Instructions (Signed)
F/u as before.  Your symptoms suggest a stomach virus, "gastroenteritis"  You will get info on this as well as the BRAT diet.  Order put in for scan of your abdomen and pelvis today due to the extent of pain and h/o blood in the mouth this morning   Stat labs amylase, lipase, chem 7 , cbc and diff Hepatic panel not stat  Zofran given for nausea in the office and meds prescribed for nausea, stomach cramps and diarrheah

## 2013-01-22 ENCOUNTER — Encounter: Payer: Self-pay | Admitting: Family Medicine

## 2013-01-22 ENCOUNTER — Other Ambulatory Visit: Payer: Self-pay | Admitting: Family Medicine

## 2013-01-22 DIAGNOSIS — R911 Solitary pulmonary nodule: Secondary | ICD-10-CM

## 2013-01-22 DIAGNOSIS — K8020 Calculus of gallbladder without cholecystitis without obstruction: Secondary | ICD-10-CM | POA: Insufficient documentation

## 2013-01-22 LAB — POC HEMOCCULT BLD/STL (OFFICE/1-CARD/DIAGNOSTIC)

## 2013-01-22 MED ORDER — ONDANSETRON HCL 4 MG/2ML IJ SOLN
4.0000 mg | Freq: Once | INTRAMUSCULAR | Status: AC
  Administered 2013-01-22: 4 mg via INTRAMUSCULAR

## 2013-01-22 NOTE — Addendum Note (Signed)
Addended by: Kandis Fantasia B on: 01/22/2013 10:05 AM   Modules accepted: Orders

## 2013-01-22 NOTE — Assessment & Plan Note (Signed)
Acute abdominal pain LUQ with symptoms of acute GE, abdominal scan due to severity of discomfort with stat labs.

## 2013-01-22 NOTE — Assessment & Plan Note (Signed)
Acute gastroenteritis will treat symptomatically

## 2013-01-26 DIAGNOSIS — Z0000 Encounter for general adult medical examination without abnormal findings: Secondary | ICD-10-CM | POA: Insufficient documentation

## 2013-01-26 NOTE — Assessment & Plan Note (Signed)
Increased and uncontrolled x 1 week, anti inflammatories and referral for therapy

## 2013-01-26 NOTE — Assessment & Plan Note (Signed)
Pelvic, breast and rectal exam completed as documented. Physical therapy referral for neck and back pain and anti inflammatories in office. Pt encouraged to commit to daily exercise and change her diet to facilitate weigh loss and improve health Zostavax administered at visit. She needs the flu vaccine also

## 2013-04-22 ENCOUNTER — Ambulatory Visit (HOSPITAL_COMMUNITY)
Admission: RE | Admit: 2013-04-22 | Discharge: 2013-04-22 | Disposition: A | Discharge status: Home / Self Care | Payer: Federal, State, Local not specified - PPO | Source: Ambulatory Visit | Attending: Family Medicine | Admitting: Family Medicine

## 2013-04-22 DIAGNOSIS — Z09 Encounter for follow-up examination after completed treatment for conditions other than malignant neoplasm: Secondary | ICD-10-CM | POA: Insufficient documentation

## 2013-04-22 DIAGNOSIS — R911 Solitary pulmonary nodule: Secondary | ICD-10-CM | POA: Insufficient documentation

## 2013-04-23 ENCOUNTER — Other Ambulatory Visit: Payer: Self-pay | Admitting: Family Medicine

## 2013-04-23 DIAGNOSIS — R911 Solitary pulmonary nodule: Secondary | ICD-10-CM

## 2013-04-24 ENCOUNTER — Other Ambulatory Visit: Payer: Self-pay | Admitting: Family Medicine

## 2013-04-24 ENCOUNTER — Telehealth: Payer: Self-pay | Admitting: Family Medicine

## 2013-04-24 DIAGNOSIS — R911 Solitary pulmonary nodule: Secondary | ICD-10-CM

## 2013-04-24 NOTE — Telephone Encounter (Signed)
Stacey Smith will call [facility] and cancel referral based on notes from Dr. Lodema Hong.  Patient referred to [facility] instead.

## 2013-04-24 NOTE — Telephone Encounter (Signed)
pls cancel the referral at that clinic, I will instead send the pt to [facility] to follow the lung nodule, pls let the clinic know the change in plan. If a lung specialist believes she needs the PET scan they will order, pt is aware of this change in plan, she will only need appt info Thanks

## 2013-04-29 ENCOUNTER — Telehealth: Payer: Self-pay | Admitting: Family Medicine

## 2013-04-30 MED ORDER — IBUPROFEN 800 MG PO TABS: 800.0000 mg | ORAL_TABLET | Freq: Three times a day (TID) | ORAL | Status: DC | PRN

## 2013-04-30 MED ORDER — FLUTICASONE PROPIONATE 50 MCG/ACT NA SUSP: 2.0000 | Freq: Every day | NASAL | Status: DC | PRN

## 2013-04-30 NOTE — Telephone Encounter (Signed)
Sent in

## 2013-05-14 ENCOUNTER — Ambulatory Visit (INDEPENDENT_AMBULATORY_CARE_PROVIDER_SITE_OTHER): Payer: Federal, State, Local not specified - PPO | Admitting: Internal Medicine

## 2013-05-14 ENCOUNTER — Encounter: Payer: Self-pay | Admitting: Internal Medicine

## 2013-05-14 VITALS — BP 122/82 | HR 71 | Temp 97.8°F | Ht 63.75 in | Wt 191.8 lb

## 2013-05-14 DIAGNOSIS — R059 Cough, unspecified: Secondary | ICD-10-CM

## 2013-05-14 DIAGNOSIS — R911 Solitary pulmonary nodule: Secondary | ICD-10-CM

## 2013-05-14 DIAGNOSIS — R05 Cough: Secondary | ICD-10-CM

## 2013-05-14 MED ORDER — PANTOPRAZOLE SODIUM 40 MG PO TBEC: 40.0000 mg | DELAYED_RELEASE_TABLET | Freq: Every day | ORAL | Status: DC

## 2013-05-14 NOTE — Assessment & Plan Note (Signed)
Assoc with voice fatigue and hoarseness and hb classic for  Classic Upper airway cough syndrome, so named because it's frequently impossible to sort out how much is  CR/sinusitis with freq throat clearing (which can be related to primary GERD)   vs  causing  secondary (" extra esophageal")  GERD from wide swings in gastric pressure that occur with throat clearing, often  promoting self use of mint and menthol lozenges that reduce the lower esophageal sphincter tone and exacerbate the problem further in a cyclical fashion.   These are the same pts (now being labeled as having "irritable larynx syndrome" by some cough centers) who not infrequently have a history of having failed to tolerate ace inhibitors,  dry powder inhalers or biphosphonates or report having atypical reflux symptoms that don't respond to standard doses of PPI , and are easily confused as having aecopd or asthma flares by even experienced allergists/ pulmonologists.   For now max gerd rx

## 2013-05-14 NOTE — Progress Notes (Signed)
  Subjective:    Patient ID: Stacey Smith, female    DOB: 1951-05-30 MRN: [id number]  HPI  8 yobf never smoker  with incidental SPN during w/u for abd pain (which spont resolved) referred 05/14/2013 by Dr Lodema Hong for pulmonary evaluation  05/14/2013 1st pulmonary eval cc indolent onset daily  sob when speaking and dry coughing spells daytime assoc with new onset HB on motrin 800 mg for ha prn, sob with speech assoc with voice fatigue but no reproducible doe.  No obvious daytime variabilty or assoc   cp or chest tightness, subjective wheeze overt sinus   symptoms. No unusual exp hx or h/o childhood pna/ asthma or premature birth to her knowledge.    Sleeping ok without nocturnal  or early am exacerbation  of respiratory  c/o's or need for noct saba. Also denies any obvious fluctuation of symptoms with weather or environmental changes or other aggravating or alleviating factors except as outlined above   Review of Systems  Constitutional: Negative for fever, chills and unexpected weight change.  HENT: Negative for ear pain, nosebleeds, congestion, sore throat, rhinorrhea, sneezing, trouble swallowing, dental problem, voice change, postnasal drip and sinus pressure.   Eyes: Negative for visual disturbance.  Respiratory: Positive for cough and shortness of breath. Negative for choking.   Cardiovascular: Negative for chest pain and leg swelling.  Gastrointestinal: Negative for vomiting, abdominal pain and diarrhea.  Genitourinary: Negative for difficulty urinating.  Musculoskeletal: Negative for arthralgias.  Skin: Negative for rash.  Neurological: Positive for headaches. Negative for tremors and syncope.  Hematological: Does not bruise/bleed easily.       Objective:   Physical Exam  Wt Readings from Last 3 Encounters:  05/14/13 191 lb 12.8 oz (87 kg)  01/16/13 191 lb (86.637 kg)  09/18/12 191 lb (86.637 kg)    HEENT: nl dentition, turbinates, and orophanx. Nl external ear canals  without cough reflex   NECK :  without JVD/Nodes/TM/ nl carotid upstrokes bilaterally   LUNGS: no acc muscle use, clear to A and P bilaterally without cough on insp or exp maneuvers   CV:  RRR  no s3 or murmur or increase in P2, no edema   ABD:  soft and nontender with nl excursion in the supine position. No bruits or organomegaly, bowel sounds nl  MS:  warm without deformities, calf tenderness, cyanosis or clubbing  SKIN: warm and dry without lesions    NEURO:  alert, approp, no deficits    CT 04/22/13 Unchanged x 3 month 5 mm RML nodule        Assessment & Plan:

## 2013-05-14 NOTE — Patient Instructions (Signed)
Pantoprazole (protonix) 40 mg (or prilosec)  Take 30-60 min before first meal of the day  X one month- this is the best way to tell whether stomach acid is contributing to your problem.    GERD (REFLUX)  is an extremely common cause of respiratory symptoms, many times with no significant heartburn at all.    It can be treated with medication, but also with lifestyle changes including avoidance of late meals, excessive alcohol, smoking cessation, and avoid fatty foods, chocolate, peppermint, colas, red wine, and acidic juices such as orange juice.  NO MINT OR MENTHOL PRODUCTS SO NO COUGH DROPS  USE SUGARLESS CANDY INSTEAD (jolley ranchers or Stover's)  NO OIL BASED VITAMINS - use powdered substitutes.    I recommend a follow up CT Scan in 12/2013 - we need to you sooner for breathing or coughing issues

## 2013-05-14 NOTE — Assessment & Plan Note (Signed)
-  CT 04/22/13 Unchanged x 3 month 5 mm RML nodule  - [facility] file for recall 12/2013  Discussed in detail all the  indications, usual  risks and alternatives  relative to the benefits with patient who agrees to proceed with conservative  f/u  In 12/2013 as she is very low risk.

## 2013-05-27 LAB — CBC WITH DIFFERENTIAL/PLATELET
Basophils Absolute: 0 10*3/uL (ref 0.0–0.1)
Basophils Relative: 0 % (ref 0–1)
Eosinophils Absolute: 0.2 10*3/uL (ref 0.0–0.7)
HCT: 36.4 % (ref 36.0–46.0)
MCHC: 31.3 g/dL (ref 30.0–36.0)
Monocytes Absolute: 0.4 10*3/uL (ref 0.1–1.0)
Neutro Abs: 4.2 10*3/uL (ref 1.7–7.7)
Neutrophils Relative %: 55 % (ref 43–77)
RDW: 16.6 % — ABNORMAL HIGH (ref 11.5–15.5)

## 2013-05-27 LAB — COMPREHENSIVE METABOLIC PANEL
ALT: 17 U/L (ref 0–35)
AST: 15 U/L (ref 0–37)
Albumin: 3.6 g/dL (ref 3.5–5.2)
Alkaline Phosphatase: 120 U/L — ABNORMAL HIGH (ref 39–117)
BUN: 17 mg/dL (ref 6–23)
CO2: 26 mEq/L (ref 19–32)
Calcium: 9.3 mg/dL (ref 8.4–10.5)
Chloride: 105 mEq/L (ref 96–112)
Creat: 0.8 mg/dL (ref 0.50–1.10)
Glucose, Bld: 88 mg/dL (ref 70–99)
Potassium: 4.4 mEq/L (ref 3.5–5.3)
Sodium: 142 mEq/L (ref 135–145)
Total Bilirubin: 0.5 mg/dL (ref 0.3–1.2)
Total Protein: 6.7 g/dL (ref 6.0–8.3)

## 2013-05-27 LAB — LIPID PANEL
Cholesterol: 199 mg/dL (ref 0–200)
HDL: 52 mg/dL (ref >39)
LDL Cholesterol: 126 mg/dL — ABNORMAL HIGH (ref 0–99)
Total CHOL/HDL Ratio: 3.8 Ratio
Triglycerides: 106 mg/dL (ref <150)
VLDL: 21 mg/dL (ref 0–40)

## 2013-05-27 LAB — IRON: Iron: 59 ug/dL (ref 42–145)

## 2013-05-27 LAB — HEMOGLOBIN A1C
Hgb A1c MFr Bld: 6 % — ABNORMAL HIGH (ref <5.7)
Mean Plasma Glucose: 126 mg/dL — ABNORMAL HIGH (ref <117)

## 2013-05-28 ENCOUNTER — Encounter: Payer: Self-pay | Admitting: Family Medicine

## 2013-05-28 ENCOUNTER — Ambulatory Visit (INDEPENDENT_AMBULATORY_CARE_PROVIDER_SITE_OTHER): Payer: Federal, State, Local not specified - PPO | Admitting: Family Medicine

## 2013-05-28 VITALS — BP 118/74 | HR 78 | Resp 16 | Ht 63.5 in | Wt 193.0 lb

## 2013-05-28 DIAGNOSIS — K219 Gastro-esophageal reflux disease without esophagitis: Secondary | ICD-10-CM

## 2013-05-28 DIAGNOSIS — J309 Allergic rhinitis, unspecified: Secondary | ICD-10-CM

## 2013-05-28 DIAGNOSIS — E669 Obesity, unspecified: Secondary | ICD-10-CM

## 2013-05-28 DIAGNOSIS — J301 Allergic rhinitis due to pollen: Secondary | ICD-10-CM

## 2013-05-28 DIAGNOSIS — E049 Nontoxic goiter, unspecified: Secondary | ICD-10-CM

## 2013-05-28 DIAGNOSIS — E8881 Metabolic syndrome: Secondary | ICD-10-CM

## 2013-05-28 DIAGNOSIS — R7301 Impaired fasting glucose: Secondary | ICD-10-CM

## 2013-05-28 DIAGNOSIS — E785 Hyperlipidemia, unspecified: Secondary | ICD-10-CM

## 2013-05-28 MED ORDER — PHENTERMINE HCL 37.5 MG PO TBDP: 1.0000 | ORAL_TABLET | Freq: Every day | ORAL | Status: AC

## 2013-05-28 NOTE — Progress Notes (Signed)
  Subjective:    Patient ID: Stacey Smith, female    DOB: 31-Aug-1951, 62 y.o.   MRN: [id number]  HPI The PT is here for follow up and re-evaluation of chronic medical conditions, medication management and review of any available recent lab and radiology data.  Preventive health is updated, specifically  Cancer screening and Immunization.   Questions or concerns regarding consultations or procedures which the PT has had in the interim are  Addressed.Recent visit with pulmonary reviewed in full with pt as well as the need to take the protonix prescribed which she has not filled as yet The PT denies any adverse reactions to current medications since the last visit.  There are no new concerns, except for weight gain, no exercise and no specific attempt at portion control, wants help with med  C/o increased and uncontrolled allergy symptoms x 2 weeks, no fever or chills, nasal congestion and drainage and sneezing     Review of Systems See HPI Denies recent fever or chills. Denies  ear pain or sore throat. Denies chest congestion, productive cough or wheezing. Denies chest pains, palpitations and leg swelling Denies abdominal pain, nausea, vomiting,diarrhea or constipation.   Denies dysuria, frequency, hesitancy or incontinence. Denies joint pain, swelling and limitation in mobility. Denies headaches, seizures, numbness, or tingling. Denies depression, anxiety or insomnia. Denies skin break down or rash.        Objective:   Physical Exam  Patient alert and oriented and in no cardiopulmonary distress.  HEENT: No facial asymmetry, EOMI, no sinus tenderness,  oropharynx pink and moist.  Neck supple no adenopathy.Nasal mucosa erythematous and edematous  Chest: Clear to auscultation bilaterally.  CVS: S1, S2 no murmurs, no S3.  ABD: Soft non tender. Bowel sounds normal.  Ext: No edema  MS: Adequate ROM spine, shoulders, hips and knees.  Skin: Intact, no ulcerations or rash  noted.  Psych: Good eye contact, normal affect. Memory intact not anxious or depressed appearing.  CNS: CN 2-12 intact, power, tone and sensation normal throughout.       Assessment & Plan:

## 2013-05-28 NOTE — Patient Instructions (Addendum)
F/U in 4 month  Start phentermine one daily   Blood sugar is improved, which is great  Weight loss goal of 10 to 12 pounds, go for it!!!  Start protonix as direceted for reflux  Start claritin one daily along with the nasal spray for allergies Sudafed is an excellent deciongestant  HBA1C and TSH in 4 months

## 2013-06-01 DIAGNOSIS — E8881 Metabolic syndrome: Secondary | ICD-10-CM | POA: Insufficient documentation

## 2013-06-01 DIAGNOSIS — J309 Allergic rhinitis, unspecified: Secondary | ICD-10-CM | POA: Insufficient documentation

## 2013-06-01 NOTE — Assessment & Plan Note (Addendum)
Pt to continue flonase and add claritin daily and sudafed as needed, due to current flare

## 2013-06-01 NOTE — Assessment & Plan Note (Signed)
Importance of lifestyle change o educe CV  risk is stressed

## 2013-06-01 NOTE — Assessment & Plan Note (Signed)
Deteriorated. Patient re-educated about  the importance of commitment to a  minimum of 150 minutes of exercise per week. The importance of healthy food choices with portion control discussed. Encouraged to start a food diary, count calories and to consider  joining a support group. Sample diet sheets offered. Goals set by the patient for the next several months.    

## 2013-06-01 NOTE — Assessment & Plan Note (Signed)
hBA1C has improved, though still elevated. Patient educated about the importance of limiting  Carbohydrate intake , the need to commit to daily physical activity for a minimum of 30 minutes , and to commit weight loss. The fact that changes in all these areas will reduce or eliminate all together the development of diabetes is stressed.

## 2013-06-01 NOTE — Assessment & Plan Note (Signed)
Pt encouraged to fill pPI and take per recommendation of pulmonary

## 2013-06-01 NOTE — Assessment & Plan Note (Signed)
Improved, dietary management only Hyperlipidemia:Low fat diet discussed and encouraged.

## 2013-09-12 ENCOUNTER — Telehealth: Payer: Self-pay | Admitting: Family Medicine

## 2013-09-12 MED ORDER — IBUPROFEN 800 MG PO TABS: 800.0000 mg | ORAL_TABLET | Freq: Three times a day (TID) | ORAL | Status: DC | PRN

## 2013-09-12 NOTE — Telephone Encounter (Signed)
Sent in to pharmacy.  

## 2013-10-08 ENCOUNTER — Ambulatory Visit (INDEPENDENT_AMBULATORY_CARE_PROVIDER_SITE_OTHER): Payer: Federal, State, Local not specified - PPO | Admitting: Family Medicine

## 2013-10-08 ENCOUNTER — Encounter: Payer: Self-pay | Admitting: Family Medicine

## 2013-10-08 VITALS — BP 110/70 | HR 86 | Resp 18 | Ht 63.5 in | Wt 183.0 lb

## 2013-10-08 DIAGNOSIS — E669 Obesity, unspecified: Secondary | ICD-10-CM

## 2013-10-08 DIAGNOSIS — R7301 Impaired fasting glucose: Secondary | ICD-10-CM

## 2013-10-08 DIAGNOSIS — R51 Headache: Secondary | ICD-10-CM

## 2013-10-08 DIAGNOSIS — R079 Chest pain, unspecified: Secondary | ICD-10-CM

## 2013-10-08 DIAGNOSIS — K219 Gastro-esophageal reflux disease without esophagitis: Secondary | ICD-10-CM

## 2013-10-08 DIAGNOSIS — E785 Hyperlipidemia, unspecified: Secondary | ICD-10-CM

## 2013-10-08 DIAGNOSIS — R0789 Other chest pain: Secondary | ICD-10-CM

## 2013-10-08 DIAGNOSIS — R519 Headache, unspecified: Secondary | ICD-10-CM | POA: Insufficient documentation

## 2013-10-08 MED ORDER — KETOROLAC TROMETHAMINE 60 MG/2ML IJ SOLN
60.0000 mg | Freq: Once | INTRAMUSCULAR | Status: AC
  Administered 2013-10-08: 60 mg via INTRAMUSCULAR

## 2013-10-08 MED ORDER — METHYLPREDNISOLONE ACETATE 80 MG/ML IJ SUSP
80.0000 mg | Freq: Once | INTRAMUSCULAR | Status: AC
  Administered 2013-10-08: 80 mg via INTRAMUSCULAR

## 2013-10-08 MED ORDER — TOPIRAMATE 25 MG PO TABS: 25.0000 mg | ORAL_TABLET | Freq: Two times a day (BID) | ORAL | Status: DC

## 2013-10-08 MED ORDER — PANTOPRAZOLE SODIUM 40 MG PO TBEC: 40.0000 mg | DELAYED_RELEASE_TABLET | Freq: Every day | ORAL | Status: AC

## 2013-10-08 NOTE — Progress Notes (Signed)
  Subjective:    Patient ID: Stacey Smith, female    DOB: 1951/09/29, 62 y.o.   MRN: [id number]  HPI The PT is here for follow up and re-evaluation of chronic medical conditions, medication management and review of any available recent lab and radiology data.  Preventive health is updated, specifically  Cancer screening and Immunization.   Questions or concerns regarding consultations or procedures which the PT has had in the interim are  addressed. States that in the past 1 month, she has had increased headache frequency, on avg 3 headaches per week, uses ibuprofen 800mg  one tablet . Headache is generalized, behind eyes and also in neck, throbbing ,aura as in soreness to touch over upper eyelids, no nausea, weakness , numbness, headache is at a 6 and is disabling , within 30 mins gone with ibuprofen 1 month h/o soreness across chest , worse with direct pressure, esp in the past week, walking no problem, no radiation, no nausea, light headedness or diaphoresis Has changed diet and has also started walking daily with 10 pound weight loss, using no phentermine, which is excellent    Review of Systems See HPI Denies recent fever or chills. Denies sinus pressure, nasal congestion, ear pain or sore throat. Denies chest congestion, productive cough or wheezing. Denies pND, orthopnea, palpitations and leg swelling C/o intermittent epigastric discomfort with reflux symptoms,denies  nausea, vomiting,diarrhea or constipation.   Denies dysuria, frequency, hesitancy or incontinence. Denies joint pain, swelling and limitation in mobility. Denies seizures, numbness, or tingling. Denies depression, anxiety or insomnia. Denies skin break down or rash.        Objective:   Physical Exam  Patient alert and oriented and in no cardiopulmonary distress.  HEENT: No facial asymmetry, EOMI, no sinus tenderness,  oropharynx pink and moist.  Neck supple no adenopathy.  Chest: Clear to auscultation  bilaterally.Reproducible chest wall pain, CC 3, 4 , 5 bilaterally  CVS: S1, S2 no murmurs, no S3. EKG: NSR, no acute ischemia, unchanged from 2013 tracing ABD: Soft mild epigastric tenderness, no guarding or rebound. Bowel sounds normal.No mass, no organomegaly  Ext: No edema  MS: Adequate ROM spine, shoulders, hips and knees.  Skin: Intact, no ulcerations or rash noted.  Psych: Good eye contact, normal affect. Memory intact not anxious or depressed appearing.  CNS: CN 2-12 intact, power, tone and sensation normal throughout.       Assessment & Plan:

## 2013-10-08 NOTE — Patient Instructions (Addendum)
F/u with CPE in 4  Month, call if you need me before  Congrats on 10 pound weight loss, keep it up and keep active  You are referred for an MRI of your brain due to new disabling headaches  New to reduce headache frequency is topamax 25mg  take one at bedtime for 5 days , then increase to 2 at bedtime, this should reduce frequency of headaches. I think your headaches sound most like migraine headaches  Chest pain seems to be from inflammation of the chest wall. Toradol 60 mg Im and depo medrol 80mg  iM administered in the office for this. Take ibuprofen 800mg  one twice daily for the next 5 days. EKG in office today shows no change form the one you had in 2013. No evidence of acute heart damage. CXR also please get that today  Please stop caffeine, and take protonix 40mg  one daily for the next 1 month,  chest pain is likely due to uncontrollled reflux  Fasting lipid, HBA1C in 4 month, before visit

## 2013-10-09 ENCOUNTER — Ambulatory Visit (HOSPITAL_COMMUNITY)
Admission: RE | Admit: 2013-10-09 | Discharge: 2013-10-09 | Disposition: A | Discharge status: Home / Self Care | Payer: Federal, State, Local not specified - PPO | Source: Ambulatory Visit | Attending: Family Medicine | Admitting: Family Medicine

## 2013-10-09 DIAGNOSIS — R0789 Other chest pain: Secondary | ICD-10-CM

## 2013-10-12 NOTE — Assessment & Plan Note (Signed)
Hyperlipidemia:Low fat diet discussed and encouraged.  Updated lab next visit 

## 2013-10-12 NOTE — Assessment & Plan Note (Signed)
1 month h/o new daily disabling headaches, brain scan to furhter eval Start topamax for prevention as a trial, history and exam suggest migraine

## 2013-10-12 NOTE — Assessment & Plan Note (Signed)
Uncontrolled, high dose PPI for 1 month and reduce caffeine

## 2013-10-12 NOTE — Assessment & Plan Note (Signed)
Improved. Pt applauded on succesful weight loss through lifestyle change, and encouraged to continue same. Weight loss goal set for the next several months.  

## 2013-10-12 NOTE — Assessment & Plan Note (Signed)
History not suggestive of cardiac pathology. Pt has reproducible chest wall tenderness in CC 3,4,5 on both left and right sides.Toradol and depo medrol in office followed by short course of inbuprofen. CXr and EKG in office.No change in EKG , no acute ischemia

## 2013-10-15 ENCOUNTER — Ambulatory Visit (HOSPITAL_COMMUNITY)
Admission: RE | Admit: 2013-10-15 | Discharge: 2013-10-15 | Disposition: A | Discharge status: Home / Self Care | Payer: Federal, State, Local not specified - PPO | Source: Ambulatory Visit | Attending: Family Medicine | Admitting: Family Medicine

## 2013-10-15 DIAGNOSIS — R519 Headache, unspecified: Secondary | ICD-10-CM

## 2013-10-15 DIAGNOSIS — R51 Headache: Secondary | ICD-10-CM | POA: Insufficient documentation

## 2013-11-06 ENCOUNTER — Encounter: Payer: Self-pay | Admitting: Family Medicine

## 2013-11-18 IMAGING — US US SOFT TISSUE HEAD/NECK
1 series · 14 of 20 positions shown · non-contrast
Comparison: 10/07/2010

CLINICAL DATA: Goiter, prior left thyroid resection

THYROID ULTRASOUND
TECHNIQUE: Ultrasound examination of the thyroid gland and adjacent
soft tissues was performed.

[Series 1: us soft tissue head/neck · 0.09mm/px · 14 of 20 slices shown]
[im 1/20]
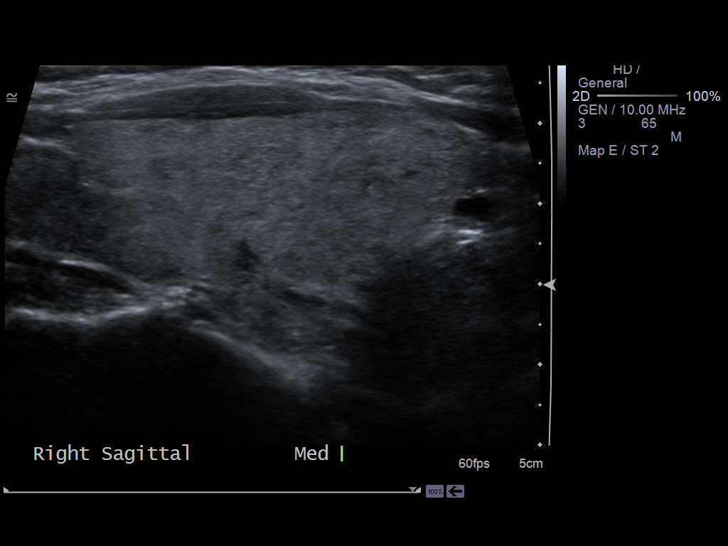
[im 3/20]
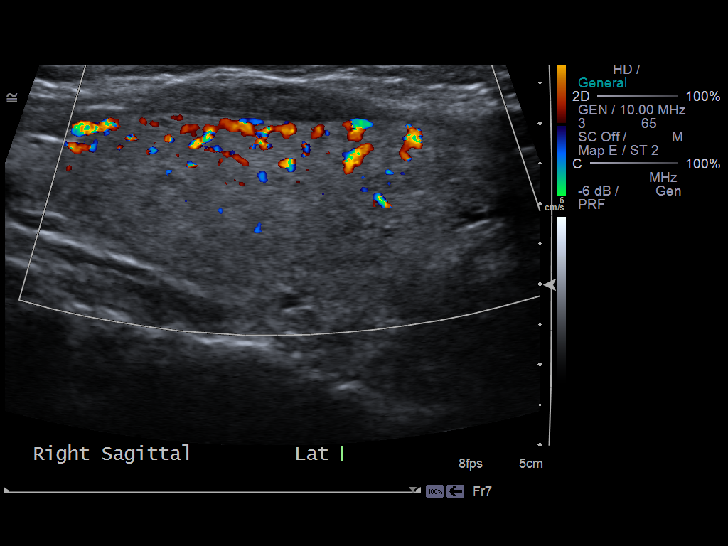
[im 4/20]
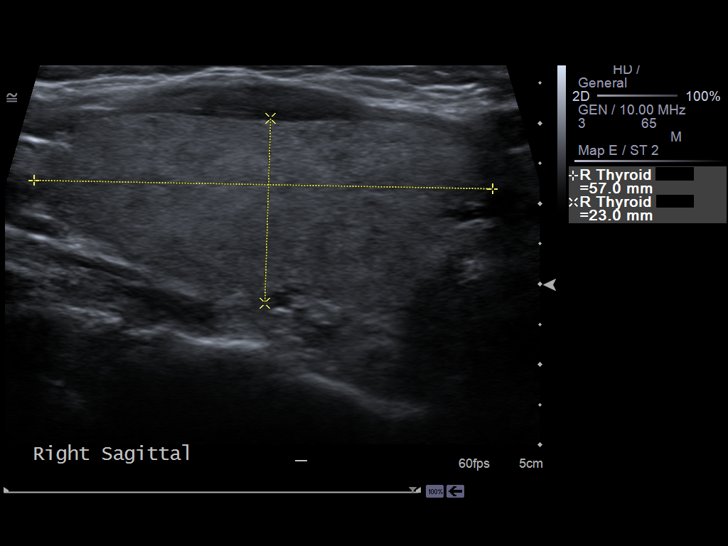
[im 6/20]
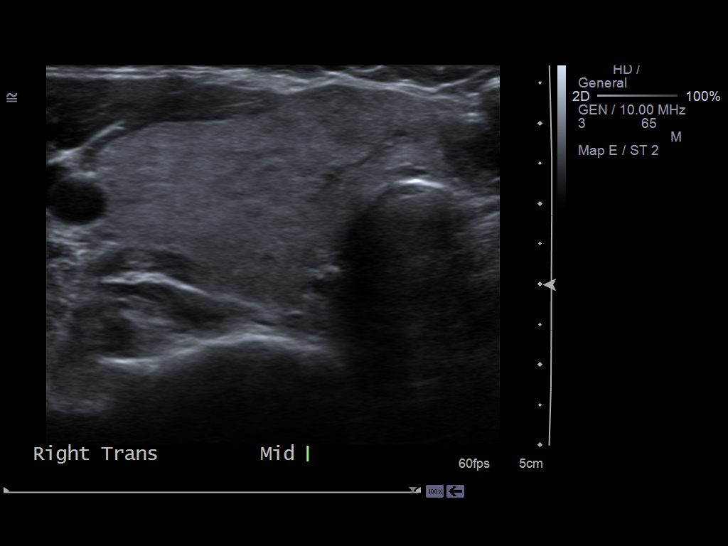
[im 7/20]
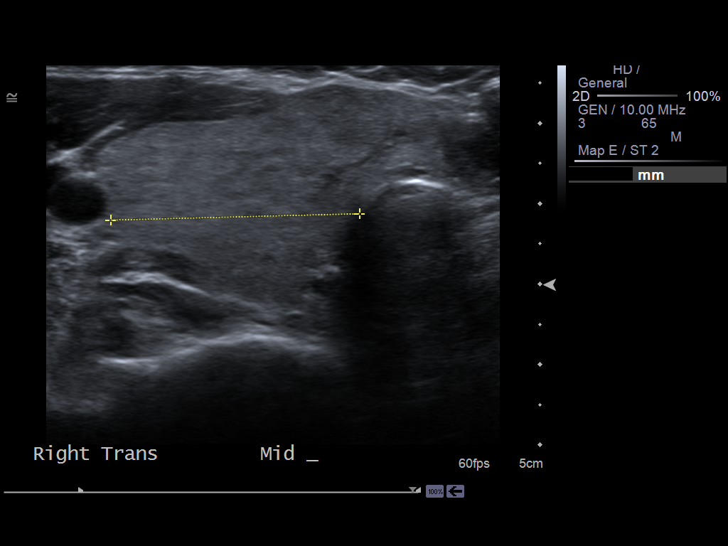
[im 8/20]
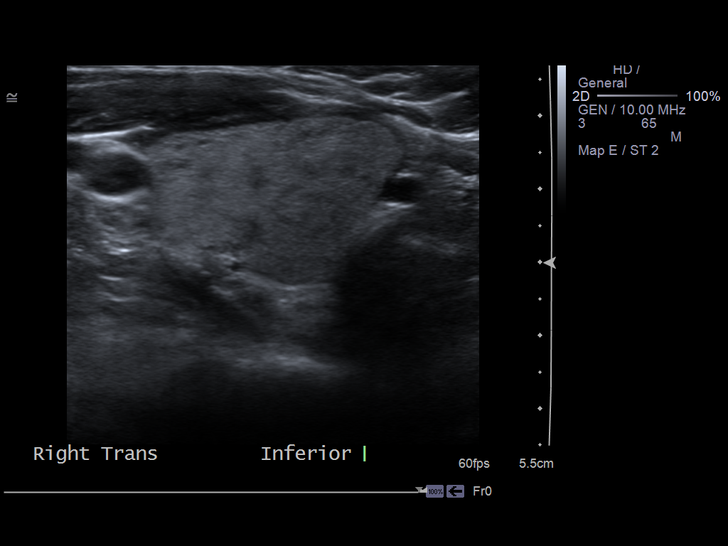
[im 10/20]
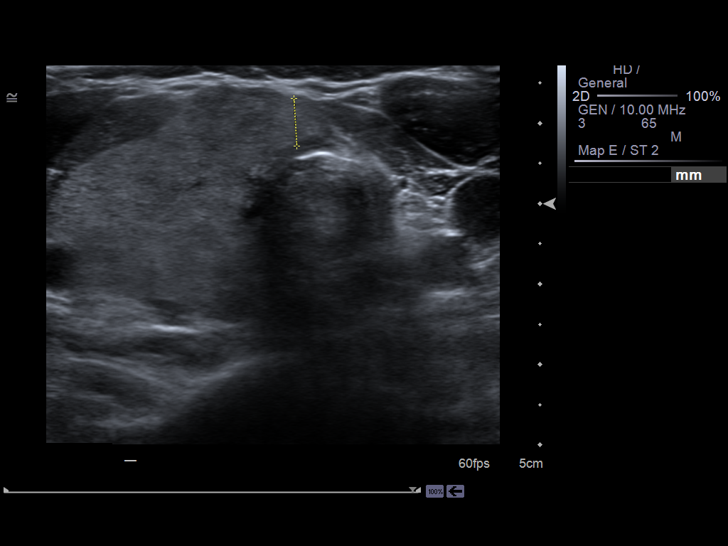
[im 11/20]
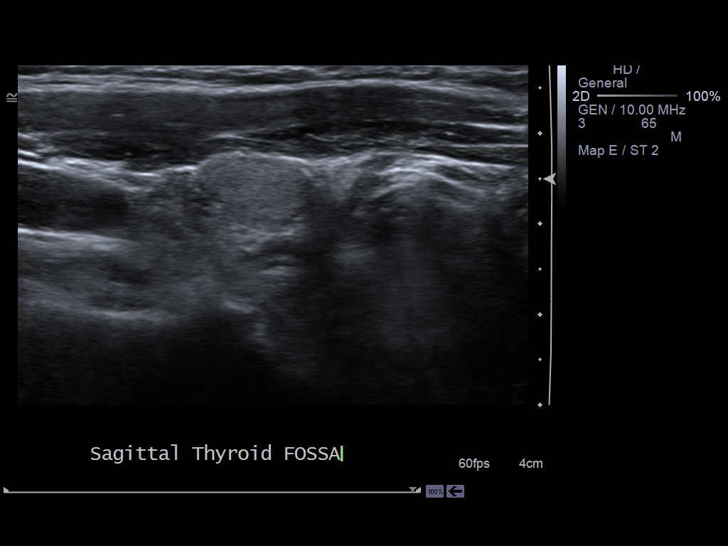
[im 13/20]
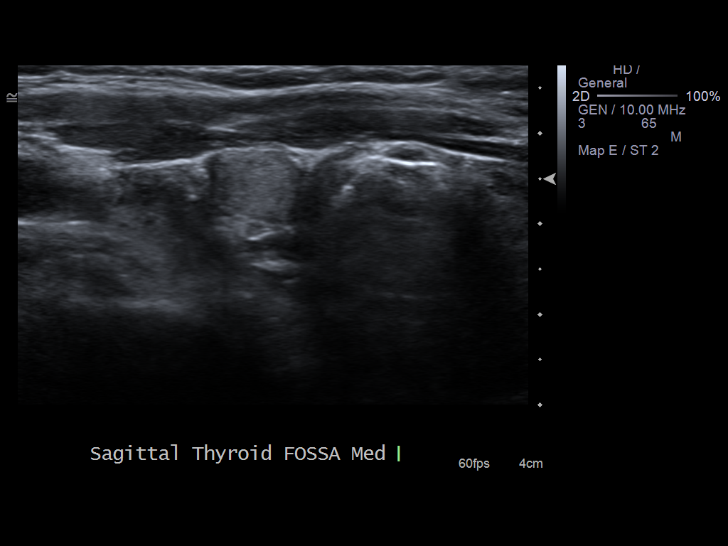
[im 14/20]
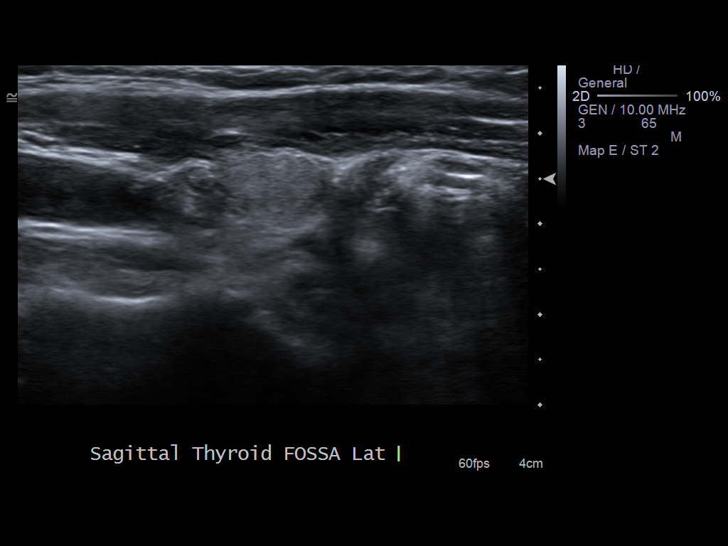
[im 16/20]
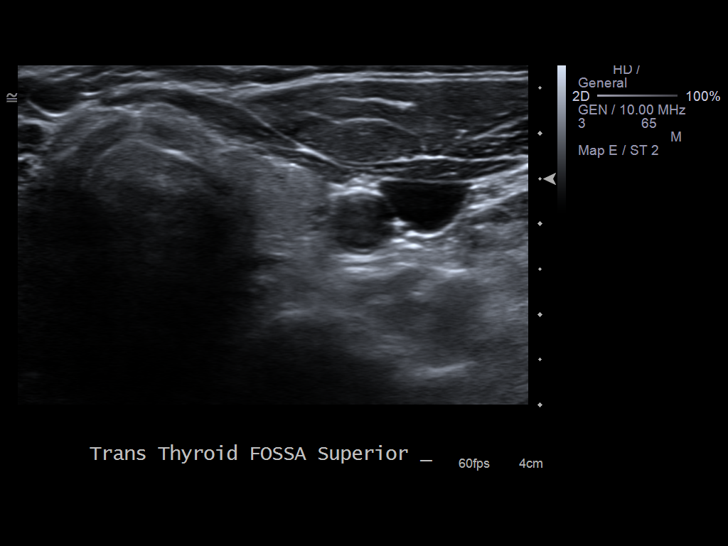
[im 17/20]
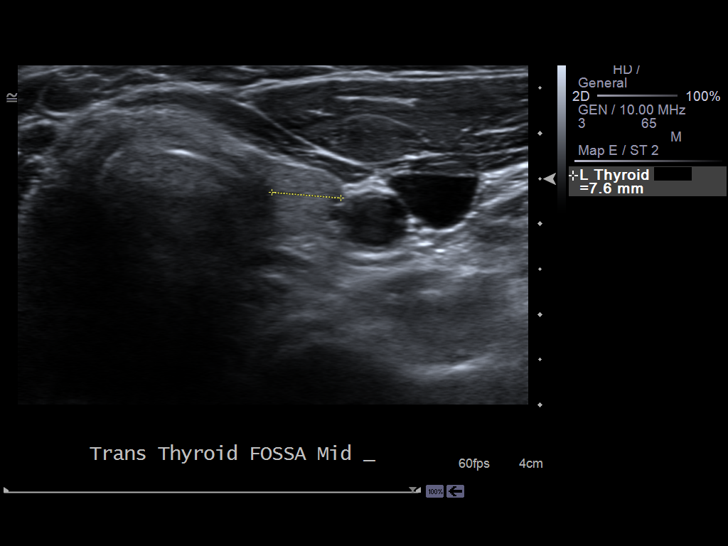
[im 18/20]
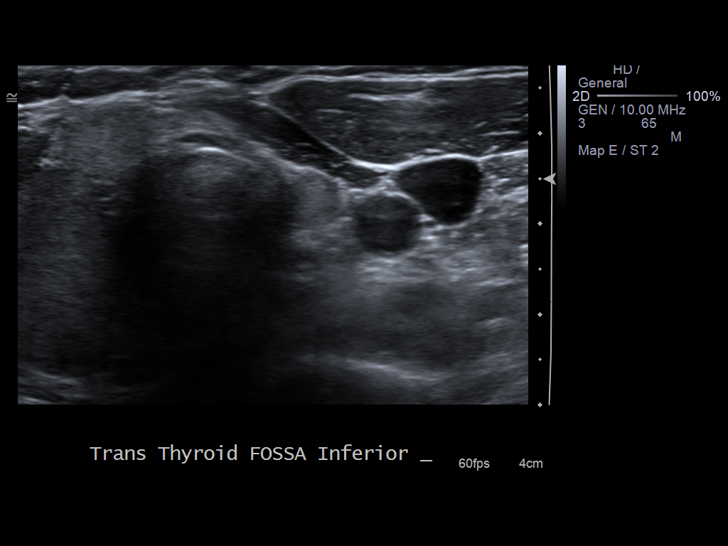
[im 20/20]
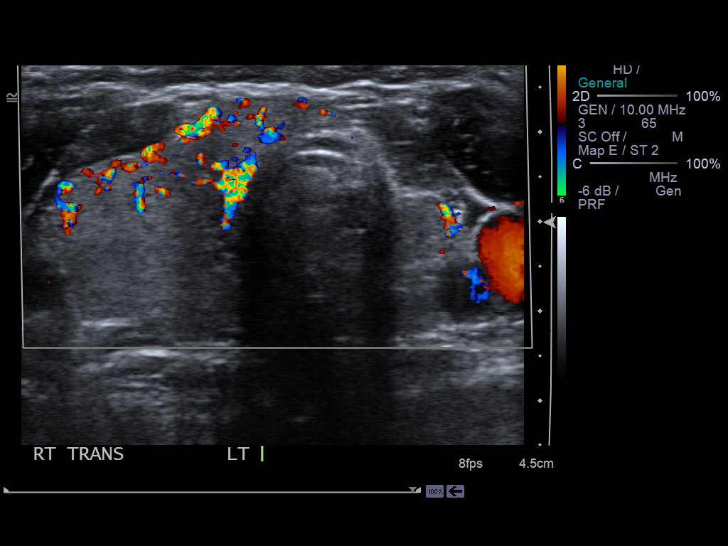

[14 of 20 positions shown; findings below may reference images not displayed]

FINDINGS: Right thyroid lobe:  5.7 x 2.3 x 3.1 cm
Left thyroid lobe:  1.3 x 0.8 x 0.8 cm
Isthmus:  6 mm thick

Focal nodules:  Inhomogeneous residual thyroid parenchyma. Probable
small focal thyroid remnant at left thyroid bed.  No definite
discrete thyroid mass or nodule identified.  No thyroid
calcifications or cyst.

Lymphadenopathy:  None identified
IMPRESSION: Probable small remnant at left thyroid bed.
Mildly enlarged right thyroid lobe without discrete mass/nodule.

## 2014-01-01 ENCOUNTER — Telehealth: Payer: Self-pay | Admitting: *Deleted

## 2014-01-01 DIAGNOSIS — R911 Solitary pulmonary nodule: Secondary | ICD-10-CM

## 2014-01-01 NOTE — Telephone Encounter (Signed)
LMTCB

## 2014-01-01 NOTE — Telephone Encounter (Signed)
Message copied by Rosana Berger on Thu [name] 01, 2014  1:31 PM ------      Message from: Christinia Gully B      Created: Wed May 14, 2013  9:06 AM       CT limited to RML nodule due this month ------

## 2014-01-07 NOTE — Telephone Encounter (Signed)
Spoke with the pt and reminded her that she is due for ct chest  She verbalized understanding  Order was sent to [facility]

## 2014-01-09 ENCOUNTER — Encounter: Payer: Self-pay | Admitting: Internal Medicine

## 2014-01-09 NOTE — Progress Notes (Signed)
Changed to 03/2014

## 2014-01-12 ENCOUNTER — Inpatient Hospital Stay: Admission: RE | Admit: 2014-01-12 | Payer: Federal, State, Local not specified - PPO | Source: Ambulatory Visit

## 2014-01-15 ENCOUNTER — Ambulatory Visit (INDEPENDENT_AMBULATORY_CARE_PROVIDER_SITE_OTHER)
Admission: RE | Admit: 2014-01-15 | Discharge: 2014-01-15 | Disposition: A | Discharge status: Home / Self Care | Payer: Federal, State, Local not specified - PPO | Source: Ambulatory Visit | Attending: Internal Medicine | Admitting: Internal Medicine

## 2014-01-15 DIAGNOSIS — R911 Solitary pulmonary nodule: Secondary | ICD-10-CM

## 2014-01-16 ENCOUNTER — Encounter: Payer: Self-pay | Admitting: Internal Medicine

## 2014-02-09 LAB — HEMOGLOBIN A1C
Hgb A1c MFr Bld: 6 % — ABNORMAL HIGH (ref <5.7)
Mean Plasma Glucose: 126 mg/dL — ABNORMAL HIGH (ref <117)

## 2014-02-09 LAB — LIPID PANEL
Cholesterol: 211 mg/dL — ABNORMAL HIGH (ref 0–200)
HDL: 57 mg/dL (ref >39)
LDL Cholesterol: 128 mg/dL — ABNORMAL HIGH (ref 0–99)
Total CHOL/HDL Ratio: 3.7 Ratio
Triglycerides: 128 mg/dL (ref <150)
VLDL: 26 mg/dL (ref 0–40)

## 2014-02-11 ENCOUNTER — Ambulatory Visit (INDEPENDENT_AMBULATORY_CARE_PROVIDER_SITE_OTHER): Payer: Federal, State, Local not specified - PPO | Admitting: Family Medicine

## 2014-02-11 ENCOUNTER — Other Ambulatory Visit: Payer: Self-pay | Admitting: Family Medicine

## 2014-02-11 ENCOUNTER — Encounter: Payer: Self-pay | Admitting: Family Medicine

## 2014-02-11 VITALS — BP 124/78 | HR 82 | Resp 18 | Ht 63.5 in | Wt 187.1 lb

## 2014-02-11 DIAGNOSIS — Z139 Encounter for screening, unspecified: Secondary | ICD-10-CM

## 2014-02-11 DIAGNOSIS — K219 Gastro-esophageal reflux disease without esophagitis: Secondary | ICD-10-CM

## 2014-02-11 DIAGNOSIS — E785 Hyperlipidemia, unspecified: Secondary | ICD-10-CM

## 2014-02-11 DIAGNOSIS — E049 Nontoxic goiter, unspecified: Secondary | ICD-10-CM

## 2014-02-11 DIAGNOSIS — E8881 Metabolic syndrome: Secondary | ICD-10-CM

## 2014-02-11 DIAGNOSIS — R7301 Impaired fasting glucose: Secondary | ICD-10-CM

## 2014-02-11 MED ORDER — METFORMIN HCL 500 MG PO TABS: 500.0000 mg | ORAL_TABLET | Freq: Two times a day (BID) | ORAL | Status: DC

## 2014-02-11 MED ORDER — ATORVASTATIN CALCIUM 10 MG PO TABS: 10.0000 mg | ORAL_TABLET | Freq: Every day | ORAL | Status: DC

## 2014-02-11 NOTE — Patient Instructions (Addendum)
F/U in 4 month, with rectal. call if you need me before  Start metformin twice daily and lipitor one at bedtime to help your cholesterol and blood sugar.  Please try to start regular exercise to reduce risk of disease and improve wellbeing  OK to take phemntermine one daily, call in for medication when you need it please  Fasting lipid, cmp , hBa1C, cBC and TSH before next visit please

## 2014-02-12 NOTE — Assessment & Plan Note (Signed)
Improved, uses protonix as needed only

## 2014-02-12 NOTE — Assessment & Plan Note (Signed)
The increased risk of cardiovascular disease associated with this diagnosis, and the need to consistently work on lifestyle to change this is discussed. Following  a  heart healthy diet ,commitment to 30 minutes of exercise at least 5 days per week, as well as control of blood sugar and cholesterol , and achieving a healthy weight are all the areas to be addressed .  

## 2014-02-12 NOTE — Assessment & Plan Note (Signed)
Unchanged. Hyperlipidemia:Low fat diet discussed and encouraged.  Start lipitor

## 2014-02-12 NOTE — Assessment & Plan Note (Signed)
Unchanged. Patient educated about the importance of limiting  Carbohydrate intake , the need to commit to daily physical activity for a minimum of 30 minutes , and to commit weight loss. The fact that changes in all these areas will reduce or eliminate all together the development of diabetes is stressed.   Updated lab needed at/ before next visit. Pt to start metformin twice daily

## 2014-02-12 NOTE — Progress Notes (Signed)
   Subjective:    Patient ID: Stacey Smith, female    DOB: [birth year], 63 y.o.   MRN: [id number]  HPI The PT is here for follow up and re-evaluation of chronic medical conditions, medication management and review of any available recent lab and radiology data.  Preventive health is updated, specifically  Cancer screening and Immunization.   Questions or concerns regarding consultations or procedures which the PT has had in the interim are  addressed.   There are no new concerns. No change in exercise or  eating habits, no change in lab work. States exercise will start when weather improves, only enjoys walking outside. No interest in phentermine at this time, already has some at home, but will "wait until she starts exercising' There are no specific complaints       Review of Systems See HPI Denies recent fever or chills. Denies sinus pressure, nasal congestion, ear pain or sore throat. Denies chest congestion, productive cough or wheezing. Denies chest pains, palpitations and leg swelling Denies abdominal pain, nausea, vomiting,diarrhea or constipation.  Infrequent need for PPI Denies dysuria, frequency, hesitancy or incontinence. Denies joint pain, swelling and limitation in mobility. Denies headaches, seizures, numbness, or tingling. Denies depression, anxiety or insomnia. Denies skin break down or rash.        Objective:   Physical Exam BP 124/78  Pulse 82  Resp 18  Ht 5' 3.5" (1.613 m)  Wt 187 lb 1.3 oz (84.859 kg)  BMI 32.62 kg/m2  SpO2 97% Patient alert and oriented and in no cardiopulmonary distress.  HEENT: No facial asymmetry, EOMI, no sinus tenderness,  oropharynx pink and moist.  Neck supple no adenopathy.  Chest: Clear to auscultation bilaterally.  CVS: S1, S2 no murmurs, no S3.  ABD: Soft non tender. Bowel sounds normal.  Ext: No edema  MS: Adequate ROM spine, shoulders, hips and knees.  Skin: Intact, no ulcerations or rash noted.  Psych: Good  eye contact, normal affect. Memory intact not anxious or depressed appearing.  CNS: CN 2-12 intact, power, tone and sensation normal throughout.        Assessment & Plan:  IMPAIRED FASTING GLUCOSE Unchanged. Patient educated about the importance of limiting  Carbohydrate intake , the need to commit to daily physical activity for a minimum of 30 minutes , and to commit weight loss. The fact that changes in all these areas will reduce or eliminate all together the development of diabetes is stressed.   Updated lab needed at/ before next visit. Pt to start metformin twice daily  HYPERLIPIDEMIA Unchanged. Hyperlipidemia:Low fat diet discussed and encouraged.  Start lipitor  Metabolic syndrome X The increased risk of cardiovascular disease associated with this diagnosis, and the need to consistently work on lifestyle to change this is discussed. Following  a  heart healthy diet ,commitment to 30 minutes of exercise at least 5 days per week, as well as control of blood sugar and cholesterol , and achieving a healthy weight are all the areas to be addressed .   GERD Improved, uses protonix as needed only

## 2014-02-17 ENCOUNTER — Ambulatory Visit (HOSPITAL_COMMUNITY)
Admission: RE | Admit: 2014-02-17 | Discharge: 2014-02-17 | Disposition: A | Discharge status: Home / Self Care | Payer: Federal, State, Local not specified - PPO | Source: Ambulatory Visit | Attending: Family Medicine | Admitting: Family Medicine

## 2014-02-17 DIAGNOSIS — Z139 Encounter for screening, unspecified: Secondary | ICD-10-CM

## 2014-02-17 DIAGNOSIS — Z1231 Encounter for screening mammogram for malignant neoplasm of breast: Secondary | ICD-10-CM | POA: Insufficient documentation

## 2014-04-06 ENCOUNTER — Other Ambulatory Visit: Payer: Self-pay

## 2014-04-06 ENCOUNTER — Telehealth: Payer: Self-pay

## 2014-04-06 MED ORDER — FLUTICASONE PROPIONATE 50 MCG/ACT NA SUSP: 2.0000 | Freq: Every day | NASAL | Status: DC | PRN

## 2014-04-06 NOTE — Telephone Encounter (Signed)
Called and left message for patient to return call. There are 2 [facility] on [address].

## 2014-04-06 NOTE — Telephone Encounter (Signed)
Med refilled to appropriate pharmacy.

## 2014-05-04 ENCOUNTER — Encounter: Payer: Self-pay | Admitting: Family Medicine

## 2014-05-04 ENCOUNTER — Ambulatory Visit (INDEPENDENT_AMBULATORY_CARE_PROVIDER_SITE_OTHER): Payer: Federal, State, Local not specified - PPO | Admitting: Family Medicine

## 2014-05-04 VITALS — BP 118/76 | HR 98 | Temp 99.1°F | Resp 18 | Ht 63.5 in | Wt 188.0 lb

## 2014-05-04 DIAGNOSIS — J209 Acute bronchitis, unspecified: Secondary | ICD-10-CM

## 2014-05-04 DIAGNOSIS — J301 Allergic rhinitis due to pollen: Secondary | ICD-10-CM

## 2014-05-04 MED ORDER — LEVOFLOXACIN 750 MG PO TABS: 750.0000 mg | ORAL_TABLET | Freq: Every day | ORAL | Status: DC

## 2014-05-04 MED ORDER — FLUCONAZOLE 150 MG PO TABS: ORAL_TABLET | ORAL | Status: DC

## 2014-05-04 NOTE — Patient Instructions (Signed)
F/u as before, call if symptoms persist or worsen.   Please take entire course of antibiotic prescribed  You are treated for acute bronchitis, levaquin one daily for 7 days is prescribed, use robitussin, as a decongestant   Work excuse fronm today to return on Wednesday

## 2014-05-06 ENCOUNTER — Ambulatory Visit (INDEPENDENT_AMBULATORY_CARE_PROVIDER_SITE_OTHER): Payer: Federal, State, Local not specified - PPO | Admitting: Family Medicine

## 2014-05-06 ENCOUNTER — Encounter: Payer: Self-pay | Admitting: Family Medicine

## 2014-05-06 ENCOUNTER — Ambulatory Visit (HOSPITAL_COMMUNITY)
Admission: RE | Admit: 2014-05-06 | Discharge: 2014-05-06 | Disposition: A | Discharge status: Home / Self Care | Payer: Federal, State, Local not specified - PPO | Source: Ambulatory Visit | Attending: Family Medicine | Admitting: Family Medicine

## 2014-05-06 VITALS — BP 104/62 | HR 84 | Temp 98.7°F | Resp 18 | Ht 63.5 in

## 2014-05-06 DIAGNOSIS — J301 Allergic rhinitis due to pollen: Secondary | ICD-10-CM

## 2014-05-06 DIAGNOSIS — J3489 Other specified disorders of nose and nasal sinuses: Secondary | ICD-10-CM | POA: Insufficient documentation

## 2014-05-06 DIAGNOSIS — R05 Cough: Secondary | ICD-10-CM | POA: Insufficient documentation

## 2014-05-06 DIAGNOSIS — J209 Acute bronchitis, unspecified: Secondary | ICD-10-CM

## 2014-05-06 DIAGNOSIS — R059 Cough, unspecified: Secondary | ICD-10-CM | POA: Insufficient documentation

## 2014-05-06 DIAGNOSIS — R5383 Other fatigue: Secondary | ICD-10-CM

## 2014-05-06 DIAGNOSIS — R5381 Other malaise: Secondary | ICD-10-CM | POA: Insufficient documentation

## 2014-05-06 DIAGNOSIS — J019 Acute sinusitis, unspecified: Secondary | ICD-10-CM

## 2014-05-06 MED ORDER — CEFTRIAXONE SODIUM 1 G IJ SOLR
500.0000 mg | Freq: Once | INTRAMUSCULAR | Status: AC
  Administered 2014-05-06: 500 mg via INTRAMUSCULAR

## 2014-05-06 MED ORDER — PREDNISONE (PAK) 5 MG PO TABS: 5.0000 mg | ORAL_TABLET | ORAL | Status: DC

## 2014-05-06 MED ORDER — METHYLPREDNISOLONE ACETATE 80 MG/ML IJ SUSP
80.0000 mg | Freq: Once | INTRAMUSCULAR | Status: AC
  Administered 2014-05-06: 80 mg via INTRAMUSCULAR

## 2014-05-06 NOTE — Patient Instructions (Addendum)
F/u as before, call if no better or worse  You need to get xray of sinus and chesty also labs today.  We will call tomorrow with results  You are being treated for uncontrolled allergy symptoms and acute sinusitis and bronchitis  Rocephin 500mg  im in the office and depo medrol 80mg  Im   Prednisone dose pack is prescribed.

## 2014-05-07 LAB — CBC WITH DIFFERENTIAL/PLATELET
Basophils Absolute: 0.1 10*3/uL (ref 0.0–0.1)
Basophils Relative: 1 % (ref 0–1)
Eosinophils Absolute: 0.2 10*3/uL (ref 0.0–0.7)
Eosinophils Relative: 3 % (ref 0–5)
HEMATOCRIT: 38.3 % (ref 36.0–46.0)
Hemoglobin: 12.1 g/dL (ref 12.0–15.0)
LYMPHS PCT: 32 % (ref 12–46)
Lymphs Abs: 2.5 10*3/uL (ref 0.7–4.0)
MCH: 23 pg — ABNORMAL LOW (ref 26.0–34.0)
MCHC: 31.6 g/dL (ref 30.0–36.0)
MCV: 72.8 fL — AB (ref 78.0–100.0)
MONO ABS: 0.7 10*3/uL (ref 0.1–1.0)
MONOS PCT: 9 % (ref 3–12)
Neutro Abs: 4.3 10*3/uL (ref 1.7–7.7)
Neutrophils Relative %: 55 % (ref 43–77)
Platelets: 181 10*3/uL (ref 150–400)
RBC: 5.26 MIL/uL — ABNORMAL HIGH (ref 3.87–5.11)
RDW: 16.1 % — ABNORMAL HIGH (ref 11.5–15.5)
WBC: 7.8 10*3/uL (ref 4.0–10.5)

## 2014-05-07 LAB — BASIC METABOLIC PANEL
BUN: 11 mg/dL (ref 6–23)
CHLORIDE: 98 meq/L (ref 96–112)
CO2: 27 mEq/L (ref 19–32)
CREATININE: 0.83 mg/dL (ref 0.50–1.10)
Calcium: 9 mg/dL (ref 8.4–10.5)
GLUCOSE: 96 mg/dL (ref 70–99)
POTASSIUM: 3.9 meq/L (ref 3.5–5.3)
Sodium: 134 mEq/L — ABNORMAL LOW (ref 135–145)

## 2014-05-07 NOTE — Progress Notes (Signed)
   Subjective:    Patient ID: Stacey Smith, female    DOB: Feb 06, 1951, 63 y.o.   MRN: [id number]  HPI Pt in reporting worsening cough and chest congestion, fatigu, chills , fever and body aches. Unable to sleep due to cough, poor apetite. Sputum and nasal drainage is yellow green and at times bloody. She has bilateral ear pain   Review of Systems See HPI  Denies PND , orthopneand leg swelling Denies abdominal pain, nausea, vomiting,diarrhea or constipation.   Denies dysuria, frequency, hesitancy or incontinence. Denies joint pain, swelling and limitation in mobility. Denies headaches, seizures, numbness, or tingling. Denies depression, anxiety or insomnia. Denies skin break down or rash.        Objective:   Physical Exam BP 104/62  Pulse 84  Temp(Src) 98.7 F (37.1 C)  Resp 18  Ht 5' 3.5" (1.613 m)  SpO2 99% Patient alert and oriented and in no cardiopulmonary distress.Ill appearing  HEENT: No facial asymmetry, EOMI, maxillarynderness,  oropharynx pink and moist.  Neck supple ant cervical adenpathy, TM clear B  Chest: decreased though adequate air entry, no wheezes basilar crackles  CVS: S1, S2 no murmurs, no S3.  ABD: Soft non tender. Bowel sounds normal.  Ext: No edema  MS: Adequate ROM spine, shoulders, hips and knees.  Skin: Intact, no ulcerations or rash noted.  Psych: Good eye contact, normal affect. Memory intact mildly anxious, not depressed .  CNS: CN 2-12 intact, power, tone and sensation normal throughout.        Assessment & Plan:  ALLERGIC RHINITIS, SEASONAL Uncontrolled allergies depo medrol in office  Acute bronchitis Rocephin 500mg  im , pt to continue levaquin, CXR pA and lateral  CBC and chem 7  Sinusitis, acute X ray of sinuses

## 2014-05-07 NOTE — Assessment & Plan Note (Addendum)
Uncontrolled allergies depo medrol in office

## 2014-05-07 NOTE — Assessment & Plan Note (Signed)
X ray of sinuses

## 2014-05-07 NOTE — Assessment & Plan Note (Addendum)
Rocephin 500mg  im , pt to continue levaquin, CXR pA and lateral  CBC and chem 7

## 2014-05-12 ENCOUNTER — Ambulatory Visit (INDEPENDENT_AMBULATORY_CARE_PROVIDER_SITE_OTHER): Payer: Federal, State, Local not specified - PPO | Admitting: Family Medicine

## 2014-05-12 ENCOUNTER — Encounter: Payer: Self-pay | Admitting: Family Medicine

## 2014-05-12 ENCOUNTER — Encounter: Payer: Self-pay | Admitting: Internal Medicine

## 2014-05-12 ENCOUNTER — Ambulatory Visit (INDEPENDENT_AMBULATORY_CARE_PROVIDER_SITE_OTHER): Payer: Federal, State, Local not specified - PPO | Admitting: Internal Medicine

## 2014-05-12 VITALS — BP 128/80 | HR 84 | Temp 97.9°F | Resp 16 | Wt 185.4 lb

## 2014-05-12 VITALS — BP 106/70 | HR 84 | Temp 98.2°F | Ht 63.0 in | Wt 187.0 lb

## 2014-05-12 DIAGNOSIS — R059 Cough, unspecified: Secondary | ICD-10-CM

## 2014-05-12 DIAGNOSIS — R05 Cough: Secondary | ICD-10-CM

## 2014-05-12 DIAGNOSIS — R6889 Other general symptoms and signs: Secondary | ICD-10-CM

## 2014-05-12 LAB — D-DIMER, QUANTITATIVE: D-Dimer, Quant: 0.64 ug/mL-FEU — ABNORMAL HIGH (ref 0.00–0.48)

## 2014-05-12 MED ORDER — ALBUTEROL SULFATE (2.5 MG/3ML) 0.083% IN NEBU
2.5000 mg | INHALATION_SOLUTION | Freq: Once | RESPIRATORY_TRACT | Status: AC
  Administered 2014-05-12: 2.5 mg via RESPIRATORY_TRACT

## 2014-05-12 MED ORDER — TRAMADOL HCL 50 MG PO TABS: ORAL_TABLET | ORAL | Status: DC

## 2014-05-12 MED ORDER — IPRATROPIUM BROMIDE 0.02 % IN SOLN
0.5000 mg | Freq: Once | RESPIRATORY_TRACT | Status: AC
  Administered 2014-05-12: 0.5 mg via RESPIRATORY_TRACT

## 2014-05-12 MED ORDER — PREDNISONE 10 MG PO TABS: ORAL_TABLET | ORAL | Status: DC

## 2014-05-12 MED ORDER — PREDNISONE (PAK) 5 MG PO TABS: 5.0000 mg | ORAL_TABLET | ORAL | Status: DC

## 2014-05-12 MED ORDER — FAMOTIDINE 20 MG PO TABS: ORAL_TABLET | ORAL | Status: DC

## 2014-05-12 NOTE — Assessment & Plan Note (Addendum)
Classic Upper airway cough syndrome, so named because it's frequently impossible to sort out how much is  CR/sinusitis with freq throat clearing (which can be related to primary GERD)   vs  causing  secondary (" extra esophageal")  GERD from wide swings in gastric pressure that occur with throat clearing, often  promoting self use of mint and menthol lozenges that reduce the lower esophageal sphincter tone and exacerbate the problem further in a cyclical fashion.   These are the same pts (now being labeled as having "irritable larynx syndrome" by some cough centers) who not infrequently have a history of having failed to tolerate ace inhibitors,  dry powder inhalers or biphosphonates or report having atypical reflux symptoms that don't respond to standard doses of PPI , and are easily confused as having aecopd or asthma flares by even experienced allergists/ pulmonologists.   For now max acid rx/ eliminate cyclical cough and rx with short course of prednisone then regroup if needed with full sinus ct next but for now she should finish all the rx given to her by Dr Moshe Cipro.  The pos d dimer at age 63 with other obvious explanation for her symptoms and no sob/cp or risk factors for dvt/pe is very unlikely to represent occult dvt/pe (the upper range of normal is not adjusted for age but probably will be soon given the number of false positives we see in this setting)

## 2014-05-12 NOTE — Patient Instructions (Addendum)
F/u in 2 month, call if you need me before  You have an appt with [facility] this afternoon at 3:30 pm, I will leave Doc who sees you to prescribe medication you need to collect at the pharmacy  Today you have received a neb treatment and depo medrol 80-mg IM  Stat d dimer blood test, evaluate shortness of breath  Today, we will contact you if abnormal

## 2014-05-12 NOTE — Progress Notes (Signed)
Subjective:    Patient ID: Stacey Smith, female    DOB: 09-18-1951 MRN: [id number]    Brief patient profile:  38 yobf never smoker  with incidental SPN during w/u for abd pain (which spont resolved) referred 05/14/2013 by Dr Moshe Cipro for pulmonary evaluation for this and recurrent upper airway cough syndrome.   History of Present Illness  05/14/2013 1st pulmonary eval cc indolent onset daily  sob when speaking and dry coughing spells daytime assoc with new onset HB on motrin 800 mg for ha prn, sob with speech assoc with voice fatigue but no reproducible doe. rec Pantoprazole (protonix) 40 mg (or prilosec)  Take 30-60 min before first meal of the day  X one month- this is the best way to tell whether stomach acid is contributing to your problem.   GERD diet   05/12/2014 f/u ov/Jaymar Loeber re: recurrent cough Chief Complaint  Patient presents with  . Follow-up    Dr. Moshe Cipro wanted the pt to be seen today due to coughing and elevated D-Dimer. Pt states that she has bronchitis but the cough will not go away.  [place] > coughs to point of bringing up"clear fluid" 24/7.  Seen 5/11 reporting green mucus and rx with levaquin po plus one dose rocephin IM and green mucus gone.   No obvious day to day or daytime variabilty or assoc sob (unless during a coughing fit)  Or any form of  cp or chest tightness, subjective wheeze or overt   hb symptoms. No unusual exp hx or h/o childhood pna/ asthma or knowledge of premature birth.   . Also denies any obvious fluctuation of symptoms with weather or environmental changes or other aggravating or alleviating factors except as outlined above   Current Medications, Allergies, Complete Past Medical History, Past Surgical History, Family History, and Social History were reviewed in [employer] record.  ROS  The following are not active complaints unless bolded sore throat,  dysphagia, dental problems, itching, sneezing,  nasal congestion or excess/ purulent secretions, ear ache,   fever, chills, sweats, unintended wt loss, pleuritic or exertional cp, hemoptysis,  orthopnea pnd or leg swelling, presyncope, palpitations, heartburn, abdominal pain, anorexia, nausea, vomiting, diarrhea  or change in bowel or urinary habits, change in stools or urine, dysuria,hematuria,  rash, arthralgias, visual complaints, headache, numbness weakness or ataxia or problems with walking or coordination,  change in mood/affect or memory.            Objective:   Physical Exam  amb bf nad  05/12/2014       187  Wt Readings from Last 3 Encounters:  05/14/13 191 lb 12.8 oz (87 kg)  01/16/13 191 lb (86.637 kg)  09/18/12 191 lb (86.637 kg)    HEENT: nl dentition, turbinates, and orophanx. Nl external ear canals without cough reflex   NECK :  without JVD/Nodes/TM/ nl carotid upstrokes bilaterally   LUNGS: no acc muscle use, clear to A and P bilaterally without cough on insp or exp maneuvers   CV:  RRR  no s3 or murmur or increase in P2, no edema   ABD:  soft and nontender with nl excursion in the supine position. No bruits or organomegaly, bowel sounds nl  MS:  warm without deformities, calf tenderness, cyanosis or clubbing  SKIN: warm and dry without lesions    NEURO:  alert, approp, no deficits    cxr 05/06/14  No  active cardiopulmonary disease.  Sinus xray 05/06/14 > clear     Ddimer  05/12/14 0.64       Assessment & Plan:

## 2014-05-12 NOTE — Patient Instructions (Addendum)
The key to effective treatment for your cough is eliminating the non-stop cycle of cough you're stuck in long enough to let your airway heal completely and then see if there is anything still making you cough once you stop the cough suppression, but this should take no more than 5 days to figure out  First take delsym two tsp every 12 hours and supplement if needed with  tramadol 50 mg up to 1- 2 every 4 hours to suppress the urge to cough at all or even clear your throat. Swallowing water or using ice chips/non mint and menthol containing candies (such as lifesavers or sugarless jolly ranchers) are also effective.  You should rest your voice and avoid activities that you know make you cough.  Once you have eliminated the cough for 3 straight days try reducing the tramadol first,  then the delsym as tolerated.    Prednisone 10 mg take  4 each am x 2 days,   2 each am x 2 days,  1 each am x 2 days and stop (this is to eliminate allergies and inflammation from coughing)  Protonix (pantoprazole) Take 30-60 min before first meal of the day and Pepcid 20 mg one bedtime plus chlorpheniramine 4 mg x 2 at bedtime (both available over the counter)  until cough is completely gone for at least a week without the need for cough suppression  GERD (REFLUX)  is an extremely common cause of respiratory symptoms, many times with no significant heartburn at all.    It can be treated with medication, but also with lifestyle changes including avoidance of late meals, excessive alcohol, smoking cessation, and avoid fatty foods, chocolate, peppermint, colas, red wine, and acidic juices such as orange juice.  NO MINT OR MENTHOL PRODUCTS SO NO COUGH DROPS  USE HARD CANDY INSTEAD (jolley ranchers or Stover's or Lifesavers -  all available in sugarless versions) NO OIL BASED VITAMINS - use powdered substitutes.

## 2014-05-13 ENCOUNTER — Ambulatory Visit: Payer: Federal, State, Local not specified - PPO | Admitting: Family Medicine

## 2014-05-13 MED ORDER — METHYLPREDNISOLONE ACETATE 80 MG/ML IJ SUSP
80.0000 mg | Freq: Once | INTRAMUSCULAR | Status: AC
  Administered 2014-05-13: 80 mg via INTRAMUSCULAR

## 2014-06-04 ENCOUNTER — Telehealth: Payer: Self-pay | Admitting: *Deleted

## 2014-06-04 NOTE — Telephone Encounter (Signed)
Pt called wanting to get a RX for ibuprofen she has been getting headaches, pt would like them called into the [place] [facility]. Pt said if any questions to please call her at work.

## 2014-06-04 NOTE — Telephone Encounter (Signed)
Patient has Ibuprofen 800mg  on file.  What should sig and quantity be?

## 2014-06-04 NOTE — Telephone Encounter (Signed)
Ibuprofen 800mg  one three times daily, as needed #30 refill 1

## 2014-06-05 MED ORDER — IBUPROFEN 800 MG PO TABS: 800.0000 mg | ORAL_TABLET | Freq: Three times a day (TID) | ORAL | Status: DC | PRN

## 2014-06-05 NOTE — Addendum Note (Signed)
Addended by: Denman George B on: 06/05/2014 08:31 AM   Modules accepted: Orders

## 2014-06-05 NOTE — Telephone Encounter (Signed)
Med refilled to requested pharmacy  

## 2014-07-01 ENCOUNTER — Ambulatory Visit (INDEPENDENT_AMBULATORY_CARE_PROVIDER_SITE_OTHER): Payer: Federal, State, Local not specified - PPO | Admitting: Family Medicine

## 2014-07-01 ENCOUNTER — Encounter: Payer: Self-pay | Admitting: Family Medicine

## 2014-07-01 VITALS — BP 108/66 | HR 60 | Resp 18 | Wt 183.1 lb

## 2014-07-01 DIAGNOSIS — Z23 Encounter for immunization: Secondary | ICD-10-CM

## 2014-07-01 DIAGNOSIS — E669 Obesity, unspecified: Secondary | ICD-10-CM

## 2014-07-01 DIAGNOSIS — J301 Allergic rhinitis due to pollen: Secondary | ICD-10-CM

## 2014-07-01 DIAGNOSIS — M899 Disorder of bone, unspecified: Secondary | ICD-10-CM

## 2014-07-01 DIAGNOSIS — E785 Hyperlipidemia, unspecified: Secondary | ICD-10-CM

## 2014-07-01 DIAGNOSIS — Z1211 Encounter for screening for malignant neoplasm of colon: Secondary | ICD-10-CM

## 2014-07-01 DIAGNOSIS — R5383 Other fatigue: Secondary | ICD-10-CM

## 2014-07-01 DIAGNOSIS — E8881 Metabolic syndrome: Secondary | ICD-10-CM

## 2014-07-01 DIAGNOSIS — R7301 Impaired fasting glucose: Secondary | ICD-10-CM

## 2014-07-01 DIAGNOSIS — M949 Disorder of cartilage, unspecified: Secondary | ICD-10-CM

## 2014-07-01 DIAGNOSIS — E049 Nontoxic goiter, unspecified: Secondary | ICD-10-CM

## 2014-07-01 DIAGNOSIS — R5381 Other malaise: Secondary | ICD-10-CM

## 2014-07-01 LAB — POC HEMOCCULT BLD/STL (OFFICE/1-CARD/DIAGNOSTIC): Fecal Occult Blood, POC: NEGATIVE

## 2014-07-01 NOTE — Patient Instructions (Addendum)
F/u in 4 month, call if you need me before please TdAP today  HBa1C today, also [place] and vit D  Rectal exam today is normal  PLEASE commit to increasing fresh vegetable and fruit and and reducing carb intake so that your cholesterol goes down and your blood sugar normalizes  Pls commit to taking lipitor daily, as prescribed  Fasting lipid,  Hepatic HBa1C in 4 month

## 2014-07-02 LAB — HEMOGLOBIN A1C
Hgb A1c MFr Bld: 6.3 % — ABNORMAL HIGH (ref <5.7)
Mean Plasma Glucose: 134 mg/dL — ABNORMAL HIGH (ref <117)

## 2014-07-02 LAB — TSH: TSH: 0.978 u[IU]/mL (ref 0.350–4.500)

## 2014-07-02 LAB — VITAMIN D 25 HYDROXY (VIT D DEFICIENCY, FRACTURES): VIT D 25 HYDROXY: 27 ng/mL — AB (ref 30–89)

## 2014-07-05 NOTE — Assessment & Plan Note (Signed)
The increased risk of cardiovascular disease associated with this diagnosis, and the need to consistently work on lifestyle to change this is discussed. Following  a  heart healthy diet ,commitment to 30 minutes of exercise at least 5 days per week, as well as control of blood sugar and cholesterol , and achieving a healthy weight are all the areas to be addressed .  

## 2014-07-05 NOTE — Progress Notes (Signed)
   Subjective:    Patient ID: Stacey Smith, female    DOB: [birth year], 63 y.o.   MRN: [id number]  HPI The PT is here for follow up and re-evaluation of chronic medical conditions, medication management and review of any available recent lab and radiology data.  Preventive health is updated, specifically  Cancer screening and Immunization.   She has fully recovered from uncontrolled cough due to allergies which she had a few months ago concerns regarding consultations or procedures which the PT has had in the interim are  addressed. The PT denies any adverse reactions to current medications since the last visit. Not taking metformin, and taking lipitor intermittently There are no new concerns.  Denies abdominal pain , change in bM or visible blood in stool, here for rectal exam a;lso, has positive f/h of rectal cancer and had tubular adenoma 3 years ago Still no regular exercise commitment      Review of Systems See HPI Denies recent fever or chills. Denies sinus pressure, nasal congestion, ear pain or sore throat. Denies chest congestion, productive cough or wheezing. Denies chest pains, palpitations and leg swelling Denies abdominal pain, nausea, vomiting,diarrhea or constipation.   Denies dysuria, frequency, hesitancy or incontinence. Denies joint pain, swelling and limitation in mobility. Denies headaches, seizures, numbness, or tingling. Denies depression, anxiety or insomnia. Denies skin break down or rash.        Objective:   Physical Exam BP 108/66  Pulse 60  Resp 18  Wt 183 lb 1.9 oz (83.063 kg)  SpO2 98% Patient alert and oriented and in no cardiopulmonary distress.  HEENT: No facial asymmetry, EOMI,   oropharynx pink and moist.  Neck supple no JVD, no mass.  Chest: Clear to auscultation bilaterally.  CVS: S1, S2 no murmurs, no S3.Regular rate.  ABD: Soft non tender. No organomegaly or mass palpated, normal BS Rectal: good sphincter tone, no mass , heme  negative stool  Ext: No edema  MS: Adequate ROM spine, shoulders, hips and knees.  Skin: Intact, no ulcerations or rash noted.  Psych: Good eye contact, normal affect. Memory intact not anxious or depressed appearing.  CNS: CN 2-12 intact, power,  normal throughout.no focal deficits noted.        Assessment & Plan:  Impaired fasting glucose Deteriorated Patient educated about the importance of limiting  Carbohydrate intake , the need to commit to daily physical activity for a minimum of 30 minutes , and to commit weight loss. The fact that changes in all these areas will reduce or eliminate all together the development of diabetes is stressed.   Pt to start metformin , has not done so to date  HYPERLIPIDEMIA Hyperlipidemia:Low fat diet discussed and encouraged.  Updated lab needed at/ before next visit.   OBESITY Improved. Pt applauded on succesful weight loss through lifestyle change, and encouraged to continue same. Weight loss goal set for the next several months.   Metabolic syndrome X The increased risk of cardiovascular disease associated with this diagnosis, and the need to consistently work on lifestyle to change this is discussed. Following  a  heart healthy diet ,commitment to 30 minutes of exercise at least 5 days per week, as well as control of blood sugar and cholesterol , and achieving a healthy weight are all the areas to be addressed .   ALLERGIC RHINITIS, SEASONAL Improved and controlled, denies excessive cough , nasal drainage or sinus pressure

## 2014-07-05 NOTE — Assessment & Plan Note (Signed)
Improved. Pt applauded on succesful weight loss through lifestyle change, and encouraged to continue same. Weight loss goal set for the next several months.  

## 2014-07-05 NOTE — Assessment & Plan Note (Signed)
Deteriorated Patient educated about the importance of limiting  Carbohydrate intake , the need to commit to daily physical activity for a minimum of 30 minutes , and to commit weight loss. The fact that changes in all these areas will reduce or eliminate all together the development of diabetes is stressed.   Pt to start metformin , has not done so to date

## 2014-07-05 NOTE — Assessment & Plan Note (Signed)
Improved and controlled, denies excessive cough , nasal drainage or sinus pressure

## 2014-07-05 NOTE — Assessment & Plan Note (Signed)
Hyperlipidemia:Low fat diet discussed and encouraged.  Updated lab needed at/ before next visit.  

## 2014-07-20 ENCOUNTER — Telehealth: Payer: Self-pay

## 2014-07-20 NOTE — Telephone Encounter (Signed)
Called and left message for patient to return call.  

## 2014-07-21 ENCOUNTER — Telehealth: Payer: Self-pay | Admitting: Family Medicine

## 2014-07-21 ENCOUNTER — Other Ambulatory Visit: Payer: Self-pay

## 2014-07-21 MED ORDER — MECLIZINE HCL 12.5 MG PO TABS: 12.5000 mg | ORAL_TABLET | Freq: Three times a day (TID) | ORAL | Status: DC | PRN

## 2014-07-21 NOTE — Telephone Encounter (Signed)
Patient states that she has dizziness upon awakening.  Left ear fullness x 2 days.  Would like to know if something can be prescribed.  She would like to use pharmacy in [place].

## 2014-07-21 NOTE — Telephone Encounter (Signed)
Noted.  See previous telephone message.  

## 2014-07-21 NOTE — Telephone Encounter (Signed)
antivert  12.5 mg one 3 times daily as needed #30 Advised daily claritin or zyrtec for fullness and sudafed one daily for next 3 days. Also I am happy to refer to ENT for further eval, pls enter if she agrees, dx is ear pain and dizziness, if she wishes to hold off for 2 days and call back if no better that should be fine also. Pls send meds

## 2014-07-21 NOTE — Addendum Note (Signed)
Addended by: Denman George B on: 07/21/2014 12:12 PM   Modules accepted: Orders

## 2014-07-21 NOTE — Telephone Encounter (Signed)
Patient aware and will call back if she would like referral.  Med sent to pharmacy.

## 2014-08-02 DIAGNOSIS — J209 Acute bronchitis, unspecified: Secondary | ICD-10-CM | POA: Insufficient documentation

## 2014-08-02 NOTE — Progress Notes (Signed)
   Subjective:    Patient ID: Stacey Smith, female    DOB: 04/06/1951, 63 y.o.   MRN: [id number]  HPI 2 day h/o excessive cough productive of thick yellow sputum associated with chills and fever. States she was well before Sinus drainage is clear , but noted to be increased in past week. Denies sore throat or ear pain   Review of Systems See HPI Chest pain with excessive cough Denies PND, orthopnea , palpitations and leg swelling Denies abdominal pain, nausea, vomiting,diarrhea or constipation.   Denies dysuria, frequency, hesitancy or incontinence. Denies joint pain, swelling and limitation in mobility. Denies headaches, seizures, numbness, or tingling. Denies depression, anxiety or insomnia. Denies skin break down or rash.        Objective:   Physical Exam BP 118/76  Pulse 98  Temp(Src) 99.1 F (37.3 C)  Resp 18  Ht 5' 3.5" (1.613 m)  Wt 188 lb (85.276 kg)  BMI 32.78 kg/m2  SpO2 98% Patient alert and oriented and in no cardiopulmonary distress.Ill appearing  HEENT: No facial asymmetry, EOMI,   oropharynx pink and moist.  Neck supple no JVD, no mass. TM clear bilaterally, nasal mucosa erythematous and edematous,  Chest: [place] air entry, scattered basilar crackles bilaterally, no wheezes.  CVS: S1, S2 no murmurs, no S3.Regular rate.  ABD: Soft non tender.   Ext: No edema  MS: Adequate ROM spine, shoulders, hips and knees.  Skin: Intact, no ulcerations or rash noted.  Psych: Good eye contact, normal affect. Memory intact not anxious or depressed appearing.  CNS: CN 2-12 intact, power,  normal throughout.no focal deficits noted.        Assessment & Plan:  Acute bronchitis Decongestant and antibiotic prescribed for 1 week and work excuse provided also  ALLERGIC RHINITIS, SEASONAL Re educated for need to use medication daily

## 2014-08-02 NOTE — Assessment & Plan Note (Signed)
Decongestant and antibiotic prescribed for 1 week and work excuse provided also

## 2014-08-02 NOTE — Assessment & Plan Note (Signed)
Re educated for need to use medication daily

## 2014-08-11 ENCOUNTER — Telehealth: Payer: Self-pay | Admitting: *Deleted

## 2014-08-11 NOTE — Telephone Encounter (Signed)
I called pt and her last colonoscopy was in 09/2011 and next recommended in 09/2016. She is not having any rectal bleeding, no gi problems. She does have a family hx of colon cancer in 2 sisters . She also has a hx of adenomatous polyps. She is on recall for 09/2016 and will call if she has problems before then. ( Letter was sent from the old system that did not have the 2012 procedure note).  PT also wants [place] to let Dr. Moshe Cipro know.

## 2014-08-11 NOTE — Telephone Encounter (Signed)
Pt called to schedule her colonoscopy, pt said she got a letter in the mail from [place]. Please advise [phone]

## 2014-08-19 NOTE — Telephone Encounter (Signed)
Agree. Patient is due in 09/2016. Will forward to Dr. Moshe Cipro for [place] per patient request.

## 2014-08-20 NOTE — Telephone Encounter (Signed)
Forwarding to Dr. Moshe Cipro for [place].

## 2014-11-01 DIAGNOSIS — Z723 Lack of physical exercise: Secondary | ICD-10-CM | POA: Insufficient documentation

## 2014-11-01 NOTE — Assessment & Plan Note (Signed)
Chronic non productive with increased SOB and reduced exercise tolerance in past 2 weeks  I believe mainly allergy mediatied, neb treatment and depo medrol in office, pulmonary eval asap Has recently completed full antibiotic course no improvement

## 2014-11-01 NOTE — Progress Notes (Signed)
   Subjective:    Patient ID: Stacey Smith, female    DOB: 1951/04/24, 63 y.o.   MRN: [id number]  HPI Pt in again with ongoing c/o chest tightness, shortness of breath and cough, has upcoming trip overseas and is concerned . Intermittent chills ,no documented fever, extreme;ly short of breath with activity reported Was evaluated and treated with antibiotic course by me in the past week, no improvement but worsening per pt reprot   Review of Systems See HPI C/o ongoing sinus pressure and pain, unable to obtain sputum or drainage Chest pain due to excess cough Denies PND, orthopnea, palpitations and leg swelling Denies abdominal pain, nausea, vomiting,diarrhea or constipation.   Denies dysuria, frequency, hesitancy or incontinence. Denies joint pain, swelling and limitation in mobility. Denies headaches, seizures, numbness, or tingling. Denies depression, anxiety or insomnia. Denies skin break down or rash.        Objective:   Physical Exam BP 128/80 mmHg  Pulse 84  Temp(Src) 97.9 F (36.6 C) (Oral)  Resp 16  Wt 185 lb 6.4 oz (84.097 kg)  SpO2 100% Patient alert and oriented and in mild  cardiopulmonary distress.Anxious and tachypneic  HEENT: No facial asymmetry, EOMI,   oropharynx pink and moist.  Neck supple no JVD, no mass.  Chest: Adequate air entry scattered wheezes, no crackles  CVS: S1, S2 no murmurs, no S3.Regular rate.  ABD: Soft non tender.   Ext: No edema  MS: Adequate ROM spine, shoulders, hips and knees.  Skin: Intact, no ulcerations or rash noted.  Psych: Good eye contact, normal affect. Memory intact  anxious not depressed appearing.  CNS: CN 2-12 intact, power,  normal throughout.no focal deficits noted.        Assessment & Plan:  Cough Chronic non productive with increased SOB and reduced exercise tolerance in past 2 weeks  I believe mainly allergy mediatied, neb treatment and depo medrol in office, pulmonary eval asap Has recently  completed full antibiotic course no improvement  Exercise intolerance Increased dyspnea with minimal activity noted in past 2 weeks, d dimer obtained, pulmonary to eval and ,manage further as deemed necessary

## 2014-11-01 NOTE — Assessment & Plan Note (Signed)
Increased dyspnea with minimal activity noted in past 2 weeks, d dimer obtained, pulmonary to eval and ,manage further as deemed necessary

## 2014-11-03 ENCOUNTER — Encounter: Payer: Self-pay | Admitting: Family Medicine

## 2014-11-03 ENCOUNTER — Ambulatory Visit (INDEPENDENT_AMBULATORY_CARE_PROVIDER_SITE_OTHER): Payer: Federal, State, Local not specified - PPO

## 2014-11-03 ENCOUNTER — Ambulatory Visit (INDEPENDENT_AMBULATORY_CARE_PROVIDER_SITE_OTHER): Payer: Federal, State, Local not specified - PPO | Admitting: Family Medicine

## 2014-11-03 VITALS — BP 120/78 | HR 84 | Resp 16 | Ht 63.0 in | Wt 190.0 lb

## 2014-11-03 DIAGNOSIS — E66811 Obesity, class 1: Secondary | ICD-10-CM

## 2014-11-03 DIAGNOSIS — Z723 Lack of physical exercise: Secondary | ICD-10-CM

## 2014-11-03 DIAGNOSIS — G441 Vascular headache, not elsewhere classified: Secondary | ICD-10-CM

## 2014-11-03 DIAGNOSIS — Z23 Encounter for immunization: Secondary | ICD-10-CM

## 2014-11-03 DIAGNOSIS — G44209 Tension-type headache, unspecified, not intractable: Secondary | ICD-10-CM

## 2014-11-03 DIAGNOSIS — E8881 Metabolic syndrome: Secondary | ICD-10-CM

## 2014-11-03 DIAGNOSIS — R51 Headache: Secondary | ICD-10-CM

## 2014-11-03 DIAGNOSIS — E785 Hyperlipidemia, unspecified: Secondary | ICD-10-CM

## 2014-11-03 DIAGNOSIS — K8050 Calculus of bile duct without cholangitis or cholecystitis without obstruction: Secondary | ICD-10-CM

## 2014-11-03 DIAGNOSIS — E669 Obesity, unspecified: Secondary | ICD-10-CM

## 2014-11-03 DIAGNOSIS — R7301 Impaired fasting glucose: Secondary | ICD-10-CM

## 2014-11-03 DIAGNOSIS — R519 Headache, unspecified: Secondary | ICD-10-CM | POA: Insufficient documentation

## 2014-11-03 DIAGNOSIS — K219 Gastro-esophageal reflux disease without esophagitis: Secondary | ICD-10-CM

## 2014-11-03 LAB — LIPID PANEL
CHOLESTEROL: 204 mg/dL — AB (ref 0–200)
HDL: 60 mg/dL (ref >39)
LDL Cholesterol: 124 mg/dL — ABNORMAL HIGH (ref 0–99)
TRIGLYCERIDES: 102 mg/dL (ref <150)
Total CHOL/HDL Ratio: 3.4 Ratio
VLDL: 20 mg/dL (ref 0–40)

## 2014-11-03 LAB — HEPATIC FUNCTION PANEL
ALBUMIN: 4.1 g/dL (ref 3.5–5.2)
ALK PHOS: 117 U/L (ref 39–117)
ALT: 15 U/L (ref 0–35)
AST: 17 U/L (ref 0–37)
BILIRUBIN INDIRECT: 0.6 mg/dL (ref 0.2–1.2)
Bilirubin, Direct: 0.1 mg/dL (ref 0.0–0.3)
TOTAL PROTEIN: 6.9 g/dL (ref 6.0–8.3)
Total Bilirubin: 0.7 mg/dL (ref 0.2–1.2)

## 2014-11-03 LAB — HEMOGLOBIN A1C
HEMOGLOBIN A1C: 6.3 % — AB (ref <5.7)
MEAN PLASMA GLUCOSE: 134 mg/dL — AB (ref <117)

## 2014-11-03 MED ORDER — IBUPROFEN 800 MG PO TABS: ORAL_TABLET | ORAL | Status: DC

## 2014-11-03 NOTE — Patient Instructions (Signed)
F/u in 4.5 month, call if you need me before  Flu vaccine today  Blood sugar is unchanged, so continue to work at this   pls start lipitor for cholesterol No metformin since intolerant  It is important that you exercise regularly at least 30 minutes 5 times a week. If you develop chest pain, have severe difficulty breathing, or feel very tired, stop exercising immediately and seek medical attention    HBa1C, cmp and eGFR, lipids in 4.5 month  You will be called on cell with  cholesterol result

## 2014-11-03 NOTE — Progress Notes (Signed)
Subjective:    Patient ID: Stacey Smith, female    DOB: 05/19/1951, 63 y.o.   MRN: [id number]  HPI The PT is here for follow up and re-evaluation of chronic medical conditions, medication management and review of any available recent lab and radiology data.  Preventive health is updated, specifically  Cancer screening and Immunization.   Questions or concerns regarding consultations or procedures which the PT has had in the interim are  addressed. The PT stopped metformin due to GI intolerance and has not been taking the lipitor prescribed, trying to correct cholesterol by diet only Still no regular exercise and has been eating out more, fixing her house There are no new concerns.  There are no specific complaints       Review of Systems See HPI Denies recent fever or chills. Denies sinus pressure, nasal congestion, ear pain or sore throat. Denies chest congestion, productive cough or wheezing. Denies chest pains, palpitations and leg swelling Denies abdominal pain, nausea, vomiting,diarrhea or constipation.   Denies dysuria, frequency, hesitancy or incontinence. Denies joint pain, swelling and limitation in mobility. Denies headaches, seizures, numbness, or tingling. Denies depression, anxiety or insomnia. Denies skin break down or rash.        Objective:   Physical Exam BP 120/78 mmHg  Pulse 84  Resp 16  Ht 5\' 3"  (1.6 m)  Wt 190 lb (86.183 kg)  BMI 33.67 kg/m2  SpO2 98% Patient alert and oriented and in no cardiopulmonary distress.  HEENT: No facial asymmetry, EOMI,   oropharynx pink and moist.  Neck supple no JVD, no mass.  Chest: Clear to auscultation bilaterally.  CVS: S1, S2 no murmurs, no S3.Regular rate.  ABD: Soft non tender.   Ext: No edema  MS: Adequate ROM spine, shoulders, hips and knees.  Skin: Intact, no ulcerations or rash noted.  Psych: Good eye contact, normal affect. Memory intact not anxious or depressed appearing.  CNS: CN 2-12  intact, power,  normal throughout.no focal deficits noted.        Assessment & Plan:  Hyperlipidemia Unchanged Hyperlipidemia:Low fat diet discussed and encouraged.  Updated lab needed at/ before next visit.   Obesity (BMI 30.0-34.9) Deteriorated. Patient re-educated about  the importance of commitment to a  minimum of 150 minutes of exercise per week. The importance of healthy food choices with portion control discussed. Encouraged to start a food diary, count calories and to consider  joining a support group. Sample diet sheets offered. Goals set by the patient for the next several months.     Lack of physical exercise Re educated re the need to commit to this. Multiple risk factors for CAD and gaining weight  Metabolic syndrome X The increased risk of cardiovascular disease associated with this diagnosis, and the need to consistently work on lifestyle to change this is discussed. Following  a  heart healthy diet ,commitment to 30 minutes of exercise at least 5 days per week, as well as control of blood sugar and cholesterol , and achieving a healthy weight are all the areas to be addressed .   Impaired fasting glucose Unchanged Patient educated about the importance of limiting  Carbohydrate intake , the need to commit to daily physical activity for a minimum of 30 minutes , and to commit weight loss. The fact that changes in all these areas will reduce or eliminate all together the development of diabetes is stressed.     GERD Improved, uses medication as needed only  Cholelithiasis  Generally asymptomatic no plan  For cholecystectomy at this time

## 2014-11-09 DIAGNOSIS — Z23 Encounter for immunization: Secondary | ICD-10-CM | POA: Insufficient documentation

## 2014-11-09 NOTE — Assessment & Plan Note (Signed)
Generally asymptomatic no plan  For cholecystectomy at this time

## 2014-11-09 NOTE — Assessment & Plan Note (Signed)
Unchanged Hyperlipidemia:Low fat diet discussed and encouraged.  Updated lab needed at/ before next visit.

## 2014-11-09 NOTE — Assessment & Plan Note (Signed)
Judicious use of ibuprofen for uncontrolled headache Pt is aware of new warnings associated with NSAIDS

## 2014-11-09 NOTE — Assessment & Plan Note (Signed)
Improved, uses medication as needed only 

## 2014-11-09 NOTE — Assessment & Plan Note (Signed)
Deteriorated. Patient re-educated about  the importance of commitment to a  minimum of 150 minutes of exercise per week. The importance of healthy food choices with portion control discussed. Encouraged to start a food diary, count calories and to consider  joining a support group. Sample diet sheets offered. Goals set by the patient for the next several months.    

## 2014-11-09 NOTE — Assessment & Plan Note (Signed)
The increased risk of cardiovascular disease associated with this diagnosis, and the need to consistently work on lifestyle to change this is discussed. Following  a  heart healthy diet ,commitment to 30 minutes of exercise at least 5 days per week, as well as control of blood sugar and cholesterol , and achieving a healthy weight are all the areas to be addressed .  

## 2014-11-09 NOTE — Assessment & Plan Note (Signed)
Re educated re the need to commit to this. Multiple risk factors for CAD and gaining weight

## 2014-11-09 NOTE — Assessment & Plan Note (Signed)
Unchanged Patient educated about the importance of limiting  Carbohydrate intake , the need to commit to daily physical activity for a minimum of 30 minutes , and to commit weight loss. The fact that changes in all these areas will reduce or eliminate all together the development of diabetes is stressed.    

## 2015-03-03 LAB — HEMOGLOBIN A1C
HEMOGLOBIN A1C: 6.4 % — AB (ref <5.7)
MEAN PLASMA GLUCOSE: 137 mg/dL — AB (ref <117)

## 2015-03-03 LAB — LIPID PANEL
CHOLESTEROL: 218 mg/dL — AB (ref 0–200)
HDL: 50 mg/dL (ref >=46)
LDL CALC: 144 mg/dL — AB (ref 0–99)
Total CHOL/HDL Ratio: 4.4 Ratio
Triglycerides: 121 mg/dL (ref <150)
VLDL: 24 mg/dL (ref 0–40)

## 2015-03-03 LAB — COMPLETE METABOLIC PANEL WITH GFR
ALT: 18 U/L (ref 0–35)
AST: 16 U/L (ref 0–37)
Albumin: 4 g/dL (ref 3.5–5.2)
Alkaline Phosphatase: 108 U/L (ref 39–117)
BUN: 20 mg/dL (ref 6–23)
CHLORIDE: 105 meq/L (ref 96–112)
CO2: 28 mEq/L (ref 19–32)
Calcium: 9.3 mg/dL (ref 8.4–10.5)
Creat: 0.68 mg/dL (ref 0.50–1.10)
GFR, Est African American: >89 mL/min
GFR, Est Non African American: >89 mL/min
GLUCOSE: 101 mg/dL — AB (ref 70–99)
Potassium: 4.2 mEq/L (ref 3.5–5.3)
Sodium: 140 mEq/L (ref 135–145)
Total Bilirubin: 0.7 mg/dL (ref 0.2–1.2)
Total Protein: 6.8 g/dL (ref 6.0–8.3)

## 2015-03-04 ENCOUNTER — Encounter: Payer: Self-pay | Admitting: Family Medicine

## 2015-03-04 ENCOUNTER — Ambulatory Visit (INDEPENDENT_AMBULATORY_CARE_PROVIDER_SITE_OTHER): Payer: Federal, State, Local not specified - PPO | Admitting: Family Medicine

## 2015-03-04 VITALS — BP 118/68 | HR 84 | Resp 16 | Ht 63.0 in | Wt 195.0 lb

## 2015-03-04 DIAGNOSIS — Z723 Lack of physical exercise: Secondary | ICD-10-CM

## 2015-03-04 DIAGNOSIS — G44209 Tension-type headache, unspecified, not intractable: Secondary | ICD-10-CM

## 2015-03-04 DIAGNOSIS — R7301 Impaired fasting glucose: Secondary | ICD-10-CM

## 2015-03-04 DIAGNOSIS — K219 Gastro-esophageal reflux disease without esophagitis: Secondary | ICD-10-CM

## 2015-03-04 DIAGNOSIS — E559 Vitamin D deficiency, unspecified: Secondary | ICD-10-CM

## 2015-03-04 DIAGNOSIS — G441 Vascular headache, not elsewhere classified: Secondary | ICD-10-CM

## 2015-03-04 DIAGNOSIS — E785 Hyperlipidemia, unspecified: Secondary | ICD-10-CM

## 2015-03-04 DIAGNOSIS — R42 Dizziness and giddiness: Secondary | ICD-10-CM

## 2015-03-04 DIAGNOSIS — D369 Benign neoplasm, unspecified site: Secondary | ICD-10-CM

## 2015-03-04 DIAGNOSIS — R7303 Prediabetes: Secondary | ICD-10-CM

## 2015-03-04 DIAGNOSIS — E669 Obesity, unspecified: Secondary | ICD-10-CM

## 2015-03-04 DIAGNOSIS — R7309 Other abnormal glucose: Secondary | ICD-10-CM

## 2015-03-04 DIAGNOSIS — Z1239 Encounter for other screening for malignant neoplasm of breast: Secondary | ICD-10-CM

## 2015-03-04 DIAGNOSIS — J301 Allergic rhinitis due to pollen: Secondary | ICD-10-CM

## 2015-03-04 DIAGNOSIS — E8881 Metabolic syndrome: Secondary | ICD-10-CM

## 2015-03-04 DIAGNOSIS — Z23 Encounter for immunization: Secondary | ICD-10-CM

## 2015-03-04 MED ORDER — IBUPROFEN 800 MG PO TABS: ORAL_TABLET | ORAL | Status: DC

## 2015-03-04 MED ORDER — MECLIZINE HCL 12.5 MG PO TABS: 12.5000 mg | ORAL_TABLET | Freq: Three times a day (TID) | ORAL | Status: DC | PRN

## 2015-03-04 MED ORDER — FLUTICASONE PROPIONATE 50 MCG/ACT NA SUSP: 2.0000 | Freq: Every day | NASAL | Status: DC | PRN

## 2015-03-04 NOTE — Progress Notes (Signed)
Subjective:    Patient ID: Stacey Smith, female    DOB: 1951/11/11, 64 y.o.   MRN: [id number]  HPI The PT is here for follow up and re-evaluation of chronic medical conditions, medication management and review of any available recent lab and radiology data.  Preventive health is updated, specifically  Cancer screening and Immunization.   Questions or concerns regarding consultations or procedures which the PT has had in the interim are  addressed. The PT denies any adverse reactions to current medications since the last visit.  1 month h/o almost 3 times per week vertigo which is disabling no nausea, first episode of vertigo was approx 1 year ago, ne recent viral illness Acute loose stool aggravated by splenda, so she has stopped this and is  using sugar instead, with dire health consequences, all labs have deteriorated and weight is increased      Review of Systems See HPI Denies recent fever or chills. Denies sinus pressure, nasal congestion, ear pain or sore throat. Denies chest congestion, productive cough or wheezing. Denies chest pains, palpitations and leg swelling Denies abdominal pain, nausea, vomiting,diarrhea or constipation.   Denies dysuria, frequency, hesitancy or incontinence. Denies joint pain, swelling and limitation in mobility. Denies headaches, seizures, numbness, or tingling. Denies depression, anxiety or insomnia. Denies skin break down or rash.        Objective:   Physical Exam BP 118/68 mmHg  Pulse 84  Resp 16  Ht 5\' 3"  (1.6 m)  Wt 195 lb (88.451 kg)  BMI 34.55 kg/m2  SpO2 97% Patient alert and oriented and in no cardiopulmonary distress.  HEENT: No facial asymmetry, EOMI,   oropharynx pink and moist.  Neck supple no JVD, no mass.No Nystagmus. Sinuses non tender, nasal mucosa normal , TM clear  Chest: Clear to auscultation bilaterally.  CVS: S1, S2 no murmurs, no S3.Regular rate.  ABD: Soft non tender.   Ext: No edema  MS: Adequate ROM  spine, shoulders, hips and knees.  Skin: Intact, no ulcerations or rash noted.  Psych: Good eye contact, normal affect. Memory intact not anxious or depressed appearing.  CNS: CN 2-12 intact, power,  normal throughout.no focal deficits noted.        Assessment & Plan:  Vertigo Increased frequency of episodes of vertigo an of longer duration in the past approx 2 months, ENT to eval and treat, not as responsive  To vertigo. First started in 2015   Obesity (BMI 30.0-34.9) Deteriorated. Patient re-educated about  the importance of commitment to a  minimum of 150 minutes of exercise per week. The importance of healthy food choices with portion control discussed. Encouraged to start a food diary, count calories and to consider  joining a support group. Sample diet sheets offered. Goals set by the patient for the next several months.      Metabolic syndrome X The increased risk of cardiovascular disease associated with this diagnosis, and the need to consistently work on lifestyle to change this is discussed. Following  a  heart healthy diet ,commitment to 30 minutes of exercise at least 5 days per week, as well as control of blood sugar and cholesterol , and achieving a healthy weight are all the areas to be addressed .    Hyperlipidemia Deteriorated Hyperlipidemia:Low fat diet discussed and encouraged.     ALLERGIC RHINITIS, SEASONAL increased symptoms with season change, pt has good response to prescription med and will start using regularly    Need for vaccination with 13-polyvalent  pneumococcal conjugate vaccine Vaccine administered at visit.    Prediabetes deteriorated Refer to diabetic ed.Patient educated about the importance of limiting  Carbohydrate intake , the need to commit to daily physical activity for a minimum of 30 minutes , and to commit weight loss. The fact that changes in all these areas will reduce or eliminate all together the development of  diabetes is stressed.      Headache Infrequent headaches , on avg 3 per month, responds to ibuprofen 800 mg one tab, judicious use of same to continue   Lack of physical exercise Still a challenge, has plans to commit to a roiutine as does not want to become diabetic, she states  Encouraged to start with a 3 day per week plan and increase to 5 days per week   GERD Symptoms depend a lot on food eaen , uses medication on as needed basis

## 2015-03-04 NOTE — Patient Instructions (Addendum)
Annual physical exam in 4 month, call if you need me before  Prevnar today  You are referred to ENT for chronic vertigo  You are referred to dietitian to understand how you need to change your eating habitas  You re referred for a mammogram this is past due will be at [facility]  Blood sugar, weight and cholesterol are all incrased , which increase risk of stroke, and heart disease so pLEASE MAKLE lifestyle changes needed  Fastin lipid, chem 7, HBa1C, TSH and vit D in 4 month  START aspirin 81mg  daily to reduce stroke risk please  Thanks for choosing [facility], we consider it a privelige to serve you.

## 2015-03-06 ENCOUNTER — Encounter: Payer: Self-pay | Admitting: Family Medicine

## 2015-03-06 DIAGNOSIS — Z23 Encounter for immunization: Secondary | ICD-10-CM | POA: Insufficient documentation

## 2015-03-06 NOTE — Assessment & Plan Note (Signed)
Still a challenge, has plans to commit to a roiutine as does not want to become diabetic, she states  Encouraged to start with a 3 day per week plan and increase to 5 days per week

## 2015-03-06 NOTE — Assessment & Plan Note (Signed)
Deteriorated. Patient re-educated about  the importance of commitment to a  minimum of 150 minutes of exercise per week. The importance of healthy food choices with portion control discussed. Encouraged to start a food diary, count calories and to consider  joining a support group. Sample diet sheets offered. Goals set by the patient for the next several months.    

## 2015-03-06 NOTE — Assessment & Plan Note (Signed)
increased symptoms with season change, pt has good response to prescription med and will start using regularly

## 2015-03-06 NOTE — Assessment & Plan Note (Signed)
deteriorated Refer to diabetic ed.Patient educated about the importance of limiting  Carbohydrate intake , the need to commit to daily physical activity for a minimum of 30 minutes , and to commit weight loss. The fact that changes in all these areas will reduce or eliminate all together the development of diabetes is stressed.

## 2015-03-06 NOTE — Assessment & Plan Note (Signed)
Symptoms depend a lot on food eaen , uses medication on as needed basis

## 2015-03-06 NOTE — Assessment & Plan Note (Signed)
The increased risk of cardiovascular disease associated with this diagnosis, and the need to consistently work on lifestyle to change this is discussed. Following  a  heart healthy diet ,commitment to 30 minutes of exercise at least 5 days per week, as well as control of blood sugar and cholesterol , and achieving a healthy weight are all the areas to be addressed .  

## 2015-03-06 NOTE — Assessment & Plan Note (Signed)
Vaccine administered at visit.  

## 2015-03-06 NOTE — Assessment & Plan Note (Signed)
Deteriorated Hyperlipidemia:Low fat diet discussed and encouraged.   

## 2015-03-06 NOTE — Assessment & Plan Note (Signed)
Infrequent headaches , on avg 3 per month, responds to ibuprofen 800 mg one tab, judicious use of same to continue

## 2015-03-06 NOTE — Assessment & Plan Note (Signed)
Increased frequency of episodes of vertigo an of longer duration in the past approx 2 months, ENT to eval and treat, not as responsive  To vertigo. First started in 2015

## 2015-03-15 ENCOUNTER — Ambulatory Visit
Admission: RE | Admit: 2015-03-15 | Discharge: 2015-03-15 | Disposition: A | Discharge status: Home / Self Care | Payer: Federal, State, Local not specified - PPO | Source: Ambulatory Visit | Attending: Family Medicine | Admitting: Family Medicine

## 2015-03-15 ENCOUNTER — Other Ambulatory Visit: Payer: Self-pay | Admitting: Family Medicine

## 2015-03-15 DIAGNOSIS — Z1239 Encounter for other screening for malignant neoplasm of breast: Secondary | ICD-10-CM

## 2015-03-24 ENCOUNTER — Encounter: Payer: Federal, State, Local not specified - PPO | Attending: "Endocrinology | Admitting: Dietician

## 2015-03-24 ENCOUNTER — Encounter: Payer: Self-pay | Admitting: Dietician

## 2015-03-24 VITALS — Wt 190.0 lb

## 2015-03-24 DIAGNOSIS — R7303 Prediabetes: Secondary | ICD-10-CM

## 2015-03-24 DIAGNOSIS — Z713 Dietary counseling and surveillance: Secondary | ICD-10-CM | POA: Insufficient documentation

## 2015-03-24 DIAGNOSIS — R7309 Other abnormal glucose: Secondary | ICD-10-CM | POA: Diagnosis not present

## 2015-03-24 NOTE — Patient Instructions (Addendum)
-  Try Smart Balance butter spread  -Always have something for breakfast and lunch   -Have something to eat at least 3x a day  -Easy options: protein shake, boiled egg + toast or cereal, yogurt with nuts  -Practice pre planning meals and snacks  -Try using half the sugar in coffee -Try using less sugar when you cook  -Start walking again!  -Increase as tolerated

## 2015-03-24 NOTE — Progress Notes (Signed)
  Medical Nutrition Therapy:  Appt start time: 5364 end time:  1130.   Assessment:  Primary concerns today: Stacey Smith states she is here to address her weight and prediabetes. She also has dyslipidemia. She is divorced and lives alone. 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   Preferred Learning Style:   No preference indicated   Learning Readiness:   Contemplating  Ready   MEDICATIONS: see list   DIETARY INTAKE:  Usual eating pattern includes 1-2 meals and 0 snacks per day.  Avoided foods include macaroni, cheese.    24-hr recall:  B ( AM): sometimes skips, Special K with skim milk OR bacon and eggs and potatoes and onions Snk ( AM): none  L ( PM): sometimes skips, leftovers or restaurant food with coworkers Snk ( PM):  D ( PM): lamb chops, broccoli, mushrooms, baked potato Snk ( PM): none  Beverages: coffee with sugar and cream, water, rarely grape juice or Sunny Delight  Usual physical activity: plans to start walking (has a pedometer)  Estimated energy needs: 1500-1600 calories 170-180 g carbohydrates 112-120 g protein 42-44 g fat  Progress Towards Goal(s):  In progress.   Nutritional Diagnosis:  [place]-2.2 Altered nutrition-related laboratory As related to erratic meal pattern, excess sugar consumption, family history of diabetes, physical inactivity, and obesity.  As evidenced by HgbA1c of 6.4% and patient report.    Intervention:  Nutrition counseling provided. Educated patient on macronutrient metabolism. Identified carbohydrate foods and encouraged patient to have small amounts throughout the day and always pair with a protein. Encouraged regular meal pattern and exercise. Counseled patient on reduction of added sugar.   Goals: -Try Smart Balance butter spread -Always have something for breakfast and lunch   -Have something to eat at least 3x a day  -Easy options: protein shake, boiled egg + toast or cereal, yogurt with  nuts -Practice pre planning meals and snacks -Try using half the sugar in coffee -Try using less sugar when you cook -Start walking again!  -Increase as tolerated   Teaching Method Utilized:  Visual Auditory  Handouts given during visit include:  MyPlate  Meal planning card  Low sodium flavoring tips  Barriers to learning/adherence to lifestyle change: none  Demonstrated degree of understanding via:  Teach Back   Monitoring/Evaluation:  Dietary intake, exercise, and body weight prn.

## 2015-05-14 ENCOUNTER — Other Ambulatory Visit: Payer: Self-pay

## 2015-05-14 DIAGNOSIS — G44209 Tension-type headache, unspecified, not intractable: Secondary | ICD-10-CM

## 2015-05-14 DIAGNOSIS — G441 Vascular headache, not elsewhere classified: Secondary | ICD-10-CM

## 2015-05-14 MED ORDER — IBUPROFEN 800 MG PO TABS: ORAL_TABLET | ORAL | Status: DC

## 2015-07-07 ENCOUNTER — Telehealth: Payer: Self-pay | Admitting: *Deleted

## 2015-07-07 MED ORDER — PREDNISONE 5 MG PO TABS: 5.0000 mg | ORAL_TABLET | Freq: Two times a day (BID) | ORAL | Status: DC

## 2015-07-07 NOTE — Telephone Encounter (Signed)
Pt called requesting to speak with [name], I made her aware [name] is on another call, pt wants [name] to call her back.

## 2015-07-07 NOTE — Telephone Encounter (Signed)
Patient aware.  Will collect med.  Will call if she develops any signs of infection

## 2015-07-07 NOTE — Addendum Note (Signed)
Addended by: Denman George B on: 07/07/2015 04:04 PM   Modules accepted: Orders

## 2015-07-07 NOTE — Telephone Encounter (Signed)
Explain that without fever no bacterial infection present to be treated, so antibiotic not indicated and is potentially dangerous  Excess congestion will respond to pred 5 mg twice daily for 5 days , ( reduces mucus) and I also recommend daily  claritin or zyrtec both of which are OTC , continue flonase as before  Call back if she develops symptoms of infection or with furhter concerns

## 2015-07-07 NOTE — Telephone Encounter (Signed)
Patient states that she has congestion and cough but is unable to produce sputum.  No fever.  She has been taking Nquil otc.  She is concerned because she will need care for friend who recently had surgery.  Please advise.  She has a scheduled appointment for 7/18

## 2015-07-09 LAB — TSH: TSH: 0.859 u[IU]/mL (ref 0.350–4.500)

## 2015-07-09 LAB — LIPID PANEL
CHOL/HDL RATIO: 4.2 ratio
Cholesterol: 220 mg/dL — ABNORMAL HIGH (ref 0–200)
HDL: 52 mg/dL (ref >=46)
LDL Cholesterol: 148 mg/dL — ABNORMAL HIGH (ref 0–99)
Triglycerides: 101 mg/dL (ref <150)
VLDL: 20 mg/dL (ref 0–40)

## 2015-07-09 LAB — BASIC METABOLIC PANEL
BUN: 12 mg/dL (ref 6–23)
CHLORIDE: 100 meq/L (ref 96–112)
CO2: 27 mEq/L (ref 19–32)
CREATININE: 0.8 mg/dL (ref 0.50–1.10)
Calcium: 9.3 mg/dL (ref 8.4–10.5)
Glucose, Bld: 73 mg/dL (ref 70–99)
POTASSIUM: 4.2 meq/L (ref 3.5–5.3)
Sodium: 141 mEq/L (ref 135–145)

## 2015-07-09 LAB — VITAMIN D 25 HYDROXY (VIT D DEFICIENCY, FRACTURES): Vit D, 25-Hydroxy: 16 ng/mL — ABNORMAL LOW (ref 30–100)

## 2015-07-12 ENCOUNTER — Ambulatory Visit (INDEPENDENT_AMBULATORY_CARE_PROVIDER_SITE_OTHER): Payer: Federal, State, Local not specified - PPO | Admitting: Family Medicine

## 2015-07-12 ENCOUNTER — Encounter: Payer: Self-pay | Admitting: Family Medicine

## 2015-07-12 ENCOUNTER — Other Ambulatory Visit (HOSPITAL_COMMUNITY)
Admission: RE | Admit: 2015-07-12 | Discharge: 2015-07-12 | Disposition: A | Discharge status: Home / Self Care | Payer: Federal, State, Local not specified - PPO | Source: Ambulatory Visit | Attending: Family Medicine | Admitting: Family Medicine

## 2015-07-12 VITALS — BP 122/64 | HR 74 | Resp 18 | Ht 63.0 in | Wt 192.0 lb

## 2015-07-12 DIAGNOSIS — E785 Hyperlipidemia, unspecified: Secondary | ICD-10-CM | POA: Diagnosis not present

## 2015-07-12 DIAGNOSIS — Z124 Encounter for screening for malignant neoplasm of cervix: Secondary | ICD-10-CM

## 2015-07-12 DIAGNOSIS — E669 Obesity, unspecified: Secondary | ICD-10-CM

## 2015-07-12 DIAGNOSIS — R7303 Prediabetes: Secondary | ICD-10-CM

## 2015-07-12 DIAGNOSIS — J301 Allergic rhinitis due to pollen: Secondary | ICD-10-CM | POA: Diagnosis not present

## 2015-07-12 DIAGNOSIS — Z0000 Encounter for general adult medical examination without abnormal findings: Secondary | ICD-10-CM | POA: Diagnosis not present

## 2015-07-12 DIAGNOSIS — R7309 Other abnormal glucose: Secondary | ICD-10-CM

## 2015-07-12 DIAGNOSIS — E559 Vitamin D deficiency, unspecified: Secondary | ICD-10-CM | POA: Diagnosis not present

## 2015-07-12 DIAGNOSIS — Z01419 Encounter for gynecological examination (general) (routine) without abnormal findings: Secondary | ICD-10-CM | POA: Insufficient documentation

## 2015-07-12 DIAGNOSIS — Z1211 Encounter for screening for malignant neoplasm of colon: Secondary | ICD-10-CM | POA: Diagnosis not present

## 2015-07-12 LAB — POC HEMOCCULT BLD/STL (OFFICE/1-CARD/DIAGNOSTIC): Fecal Occult Blood, POC: NEGATIVE

## 2015-07-12 LAB — HEMOGLOBIN A1C

## 2015-07-12 MED ORDER — FLUTICASONE PROPIONATE 50 MCG/ACT NA SUSP: 2.0000 | Freq: Every day | NASAL | Status: DC

## 2015-07-12 MED ORDER — AZELASTINE HCL 0.1 % NA SOLN: 2.0000 | Freq: Two times a day (BID) | NASAL | Status: DC

## 2015-07-12 MED ORDER — ERGOCALCIFEROL 1.25 MG (50000 UT) PO CAPS: 50000.0000 [IU] | ORAL_CAPSULE | ORAL | Status: DC

## 2015-07-12 MED ORDER — METHYLPREDNISOLONE ACETATE 80 MG/ML IJ SUSP
80.0000 mg | Freq: Once | INTRAMUSCULAR | Status: AC
  Administered 2015-07-12: 80 mg via INTRAMUSCULAR

## 2015-07-12 NOTE — Progress Notes (Signed)
   Subjective:    Patient ID: Stacey Smith, female    DOB: [birth year], 64 y.o.   MRN: [id number]  HPI    Review of Systems     Objective:   Physical Exam        Assessment & Plan:  Encounter for annual physical exam Annual exam as documented. Counseling done  re healthy lifestyle involving commitment to 150 minutes exercise per week, heart healthy diet, and attaining healthy weight.The importance of adequate sleep also discussed. Regular seat belt use and home safety, is also discussed. Changes in health habits are decided on by the patient with goals and time frames  set for achieving them. Immunization and cancer screening needs are specifically addressed at this visit.   Obesity (BMI 30.0-34.9) Deteriorated. Patient re-educated about  the importance of commitment to a  minimum of 150 minutes of exercise per week.  The importance of healthy food choices with portion control discussed. Encouraged to start a food diary, count calories and to consider  joining a support group. Sample diet sheets offered. Goals set by the patient for the next several months.   Weight /BMI 07/12/2015 03/24/2015 03/04/2015  WEIGHT 192 lb 0.6 oz 190 lb 195 lb  HEIGHT 5\' 3"  - 5\' 3"   BMI 34.03 kg/m2 33.67 kg/m2 34.55 kg/m2    Current exercise per week 0 minutes.   ALLERGIC RHINITIS, SEASONAL Uncontrolled, depo medrol in office , pt to start daily astelin and flonase and continue zyrtec, also saline flushes  Vitamin D deficiency Start weekly vitamin D this is prescribed

## 2015-07-12 NOTE — Assessment & Plan Note (Signed)
Deteriorated. Patient re-educated about  the importance of commitment to a  minimum of 150 minutes of exercise per week.  The importance of healthy food choices with portion control discussed. Encouraged to start a food diary, count calories and to consider  joining a support group. Sample diet sheets offered. Goals set by the patient for the next several months.   Weight /BMI 07/12/2015 03/24/2015 03/04/2015  WEIGHT 192 lb 0.6 oz 190 lb 195 lb  HEIGHT 5\' 3"  - 5\' 3"   BMI 34.03 kg/m2 33.67 kg/m2 34.55 kg/m2    Current exercise per week 0 minutes.

## 2015-07-12 NOTE — Addendum Note (Signed)
Addended by: Tula Nakayama E on: 07/12/2015 02:04 PM   Modules accepted: Miquel Dunn

## 2015-07-12 NOTE — Assessment & Plan Note (Signed)

## 2015-07-12 NOTE — Assessment & Plan Note (Signed)
Start weekly vitamin D this is prescribed

## 2015-07-12 NOTE — Progress Notes (Signed)
   Subjective:    Patient ID: Stacey Smith, female    DOB: 28-Jun-1951, 64 y.o.   MRN: [id number]  HPI Patient is in for annual physical exam. lRecent labs are reviewed C/o cough and excess nasal congestion and drainage, some improvement with oral steroid, but needs to resume daily nasal spray and start saline flushes, no fever or chills  Review of Systems See HPI     Objective:   Physical Exam  BP 122/64 mmHg  Pulse 74  Resp 18  Ht 5\' 3"  (1.6 m)  Wt 192 lb 0.6 oz (87.109 kg)  BMI 34.03 kg/m2  SpO2 100% Pleasant well nourished female, alert and oriented x 3, in no cardio-pulmonary distress. Afebrile. HEENT No facial trauma or asymetry. Sinuses non tender.  Extra occullar muscles intact, Head congestion with nasal speech and congested cough, though lungs are clear External ears normal, tympanic membranes clear. Oropharynx moist, no exudate, good dentition. Neck: supple, no adenopathy,JVD or thyromegaly.No bruits.  Chest: Clear to ascultation bilaterally.No crackles or wheezes. Non tender to palpation  Breast: No asymetry,no masses or lumps. No tenderness. No nipple discharge or inversion. No axillary or supraclavicular adenopathy  Cardiovascular system; Heart sounds normal,  S1 and  S2 ,no S3.  No murmur, or thrill. Apical beat not displaced Peripheral pulses normal.  Abdomen: Soft, non tender, no organomegaly or masses. No bruits. Bowel sounds normal. No guarding, tenderness or rebound.  Rectal:  Normal sphincter tone. No mass.No rectal masses.  Guaiac negative stool.  GU: External genitalia normal female genitalia , female distribution of hair. No lesions. Urethral meatus normal in size, no  Prolapse, no lesions visibly  Present. Bladder non tender. Vagina pink and moist , with no visible lesions , discharge present . Adequate pelvic support no  cystocele or rectocele noted  Uterus absent, no adnexal masses, no  adnexal tenderness.   Musculoskeletal  exam: Full ROM of spine, hips , shoulders and knees. No deformity ,swelling or crepitus noted. No muscle wasting or atrophy.   Neurologic: Cranial nerves 2 to 12 intact. Power, tone ,sensation and reflexes normal throughout. No disturbance in gait. No tremor.  Skin: Intact, no ulceration, erythema , scaling or rash noted. Pigmentation normal throughout  Psych; Normal mood and affect. Judgement and concentration normal       Assessment & Plan:

## 2015-07-12 NOTE — Addendum Note (Signed)
Addended by: Eual Fines on: 07/12/2015 03:04 PM   Modules accepted: Orders

## 2015-07-12 NOTE — Assessment & Plan Note (Signed)
Uncontrolled, depo medrol in office , pt to start daily astelin and flonase and continue zyrtec, also saline flushes

## 2015-07-12 NOTE — Patient Instructions (Addendum)
F/u early December, call if you need me befrpore  Depo medrol in office today for uncontrolled allergies with cough, also tart daily astelin nasal spray and continue zrtec daily  Saline nasal flushes  (ocean spray) 2 to 3 times daily also beneficial  Please change eating habits as discussed and commit to 30 minutes daily of exercise, for improved health  Vit D is low needs once weekly vit D which is also prescribed,  thyroid function is normal, also your kidney function  CBC, fasting lipid and HBA1C 1 week before December visit pls  Thanks for choosing [facility], we consider it a privelige to serve you.   Please work on good  health habits so that your health will improve. 1. Commitment to daily physical activity for 30 to 60  minutes, if you are able to do this.  2. Commitment to wise food choices. Aim for half of your  food intake to be vegetable and fruit, one quarter starchy foods, and one quarter protein. Try to eat on a regular schedule  3 meals per day, snacking between meals should be limited to vegetables or fruits or small portions of nuts. 64 ounces of water per day is generally recommended, unless you have specific health conditions, like heart failure or kidney failure where you will need to limit fluid intake.  3. Commitment to sufficient and a  good quality of physical and mental rest daily, generally between 6 to 8 hours per day.  WITH PERSISTANCE AND PERSEVERANCE, THE IMPOSSIBLE , BECOMES THE NORM!

## 2015-07-13 LAB — CYTOLOGY - PAP

## 2015-11-01 ENCOUNTER — Telehealth: Payer: Self-pay | Admitting: Family Medicine

## 2015-11-01 MED ORDER — ALPRAZOLAM 0.25 MG PO TABS: 0.2500 mg | ORAL_TABLET | Freq: Two times a day (BID) | ORAL | Status: DC | PRN

## 2015-11-01 NOTE — Telephone Encounter (Signed)
Pt came by office today, distraught, due to loss of a very dear friend in MVA, requests med for her nerves pls send xanax and advise her, also express my condolence and concern

## 2015-11-01 NOTE — Telephone Encounter (Signed)
Medication sent to requested pharmacy.  Patient accepts condolences.

## 2015-12-10 ENCOUNTER — Other Ambulatory Visit: Payer: Self-pay | Admitting: Family Medicine

## 2015-12-10 LAB — CBC WITH DIFFERENTIAL/PLATELET
BASOS ABS: 0 10*3/uL (ref 0.0–0.1)
BASOS PCT: 0 % (ref 0–1)
Eosinophils Absolute: 0.3 10*3/uL (ref 0.0–0.7)
Eosinophils Relative: 3 % (ref 0–5)
HCT: 38.6 % (ref 36.0–46.0)
Hemoglobin: 11.9 g/dL — ABNORMAL LOW (ref 12.0–15.0)
LYMPHS ABS: 3.2 10*3/uL (ref 0.7–4.0)
Lymphocytes Relative: 33 % (ref 12–46)
MCH: 22.9 pg — ABNORMAL LOW (ref 26.0–34.0)
MCHC: 30.8 g/dL (ref 30.0–36.0)
MCV: 74.4 fL — AB (ref 78.0–100.0)
MPV: 9.8 fL (ref 8.6–12.4)
Monocytes Absolute: 0.6 10*3/uL (ref 0.1–1.0)
Monocytes Relative: 6 % (ref 3–12)
NEUTROS PCT: 58 % (ref 43–77)
Neutro Abs: 5.6 10*3/uL (ref 1.7–7.7)
PLATELETS: 237 10*3/uL (ref 150–400)
RBC: 5.19 MIL/uL — ABNORMAL HIGH (ref 3.87–5.11)
RDW: 16.4 % — ABNORMAL HIGH (ref 11.5–15.5)
WBC: 9.7 10*3/uL (ref 4.0–10.5)

## 2015-12-11 LAB — LIPID PANEL
CHOLESTEROL: 257 mg/dL — AB (ref 125–200)
HDL: 54 mg/dL (ref >=46)
LDL Cholesterol: 178 mg/dL — ABNORMAL HIGH (ref <130)
Total CHOL/HDL Ratio: 4.8 Ratio (ref <=5.0)
Triglycerides: 123 mg/dL (ref <150)
VLDL: 25 mg/dL (ref <30)

## 2015-12-11 LAB — HEMOGLOBIN A1C
HEMOGLOBIN A1C: 6.2 % — AB (ref <5.7)
Mean Plasma Glucose: 131 mg/dL — ABNORMAL HIGH (ref <117)

## 2015-12-14 ENCOUNTER — Ambulatory Visit (INDEPENDENT_AMBULATORY_CARE_PROVIDER_SITE_OTHER): Payer: Federal, State, Local not specified - PPO | Admitting: Family Medicine

## 2015-12-14 ENCOUNTER — Encounter: Payer: Self-pay | Admitting: Family Medicine

## 2015-12-14 VITALS — BP 136/76 | HR 92 | Resp 18 | Ht 63.0 in | Wt 193.0 lb

## 2015-12-14 DIAGNOSIS — R7303 Prediabetes: Secondary | ICD-10-CM

## 2015-12-14 DIAGNOSIS — E8881 Metabolic syndrome: Secondary | ICD-10-CM

## 2015-12-14 DIAGNOSIS — E785 Hyperlipidemia, unspecified: Secondary | ICD-10-CM

## 2015-12-14 DIAGNOSIS — E559 Vitamin D deficiency, unspecified: Secondary | ICD-10-CM

## 2015-12-14 DIAGNOSIS — M542 Cervicalgia: Secondary | ICD-10-CM | POA: Diagnosis not present

## 2015-12-14 MED ORDER — ERGOCALCIFEROL 1.25 MG (50000 UT) PO CAPS: 50000.0000 [IU] | ORAL_CAPSULE | ORAL | Status: DC

## 2015-12-14 MED ORDER — KETOROLAC TROMETHAMINE 60 MG/2ML IM SOLN
60.0000 mg | Freq: Once | INTRAMUSCULAR | Status: AC
  Administered 2015-12-14: 60 mg via INTRAMUSCULAR

## 2015-12-14 MED ORDER — METHYLPREDNISOLONE ACETATE 80 MG/ML IJ SUSP
40.0000 mg | Freq: Once | INTRAMUSCULAR | Status: AC
  Administered 2015-12-14: 40 mg via INTRAMUSCULAR

## 2015-12-14 MED ORDER — ATORVASTATIN CALCIUM 20 MG PO TABS: 20.0000 mg | ORAL_TABLET | Freq: Every day | ORAL | Status: DC

## 2015-12-14 MED ORDER — RANITIDINE HCL 150 MG PO CAPS: 150.0000 mg | ORAL_CAPSULE | Freq: Two times a day (BID) | ORAL | Status: DC

## 2015-12-14 MED ORDER — IBUPROFEN 800 MG PO TABS: 800.0000 mg | ORAL_TABLET | Freq: Three times a day (TID) | ORAL | Status: DC

## 2015-12-14 NOTE — Assessment & Plan Note (Signed)
10 day history, progressively worsening, had 2 massages and saw ortho yestwerday, got depomedrol 80 mg IM, current is a 10 plus pain, also has prednisone dose pack and flexeril and limited amount of hydrocodone Toradol 60 mg IM and depomedrol 40 mg iM , also ibuprofen and pepcid

## 2015-12-14 NOTE — Progress Notes (Signed)
Subjective:    Patient ID: Stacey Smith, female    DOB: [birth year], 64 y.o.   MRN: [id number]  HPI Severe left neck pain radiating to left arm x 1 0 days, has had 2 massages, no benefit, saw ortho yesterday, not much relief Has flexeril, hydrocodone and prednisone. No known injury, no weakness, has been moving and cleaning out office recently   Review of Systems See HPI Denies recent fever or chills. Denies sinus pressure, nasal congestion, ear pain or sore throat. Denies chest congestion, productive cough or wheezing. Denies chest pains, palpitations and leg swelling Denies abdominal pain, nausea, vomiting,diarrhea or constipation.   Denies dysuria, frequency, hesitancy or incontinence. . Currently grieving unexpected death of her best friend/partner. No interest in therapy or medication at this time Denies skin break down or rash.        Objective:   Physical Exam BP 136/76 mmHg  Pulse 92  Resp 18  Ht 5\' 3"  (1.6 m)  Wt 193 lb (87.544 kg)  BMI 34.20 kg/m2  SpO2 96% Patient alert and oriented and in no cardiopulmonary distress.Pt in severe pain  HEENT: No facial asymmetry, EOMI,   oropharynx pink and moist.  Neck decreased ROM , left trapezius spasm no JVD, no mass.  Chest: Clear to auscultation bilaterally.  CVS: S1, S2 no murmurs, no S3.Regular rate.  ABD: Soft non tender.   Ext: No edema  MS: Adequate ROM spine, shoulders, hips and knees.  Skin: Intact, no ulcerations or rash noted.  Psych: Good eye contact, sadaffect. Memory intact not anxious or depressed appearing.  CNS: CN 2-12 intact, power,  normal throughout.no focal deficits noted.        Assessment & Plan:  Neck pain on left side 10 day history, progressively worsening, had 2 massages and saw ortho yestwerday, got depomedrol 80 mg IM, current is a 10 plus pain, also has prednisone dose pack and flexeril and limited amount of hydrocodone Toradol 60 mg IM and depomedrol 40 mg iM , also  ibuprofen and pepcid  Prediabetes Patient educated about the importance of limiting  Carbohydrate intake , the need to commit to daily physical activity for a minimum of 30 minutes , and to commit weight loss. The fact that changes in all these areas will reduce or eliminate all together the development of diabetes is stressed.  Improved  Diabetic Labs Latest Ref Rng 12/10/2015 07/08/2015 03/02/2015 11/02/2014 07/01/2014  HbA1c <5.7 % 6.2(H) CANCELED 6.4(H) 6.3(H) 6.3(H)  Chol 125 - 200 mg/dL 257(H) 220(H) 218(H) 204(H) -  HDL >=46 mg/dL 54 52 50 60 -  Calc LDL <130 mg/dL 178(H) 148(H) 144(H) 124(H) -  Triglycerides <150 mg/dL 123 101 121 102 -  Creatinine 0.50 - 1.10 mg/dL - 0.80 0.68 - -   BP/Weight 12/14/2015 07/12/2015 03/24/2015 03/04/2015 11/03/2014 07/01/2014 123456  Systolic BP XX123456 [id number] - 123456 123456 [id number]  Diastolic BP 76 64 - 68 78 66 70  Wt. (Lbs) 193 192.04 190 195 190 183.12 187  BMI 34.2 34.03 33.67 34.55 33.67 32.45 33.13   No flowsheet data found.      Vitamin D deficiency Needs to commit to weekly vit D  Hyperlipidemia Deteriorated, start lipitor and reduce fat in diet Hyperlipidemia:Low fat diet discussed and encouraged.   Lipid Panel  Lab Results  Component Value Date   CHOL 257* 12/10/2015   HDL 54 12/10/2015   LDLCALC 178* 12/10/2015   TRIG 123 12/10/2015   CHOLHDL 4.8 12/10/2015  Metabolic syndrome X The increased risk of cardiovascular disease associated with this diagnosis, and the need to consistently work on lifestyle to change this is discussed. Following  a  heart healthy diet ,commitment to 30 minutes of exercise at least 5 days per week, as well as control of blood sugar and cholesterol , and achieving a healthy weight are all the areas to be addressed .

## 2015-12-14 NOTE — Patient Instructions (Addendum)
F/u in 3.5 month, vcall if you need me before  You are treated again for the severe neck and left shoulder pain and spasm  Todradol 60 mg IM given in office also depo medrol 40 mg iM  Take the medication prescribed yesterday by orthopedics. Additionally  I have sent in ibuprofen and  Zantac  If you continue to have excessive pain, you will need an MRI of your neck, you have established arthritis in your neck  Hope this improves   Cholesterol is even higher than before and blood sugar unchanged and still too high  I recommend starting lipitor to help with changing eating for your cholesterol to improve  Need to take weekly vitamin D  Fasting lipid, cmop and EGFR, hBa1C in 3.5 month

## 2015-12-15 LAB — HIV ANTIBODY (ROUTINE TESTING W REFLEX): HIV 1&2 Ab, 4th Generation: NONREACTIVE

## 2015-12-16 LAB — HEPATITIS C ANTIBODY: HCV Ab: NEGATIVE

## 2015-12-21 NOTE — Assessment & Plan Note (Signed)
Deteriorated, start lipitor and reduce fat in diet Hyperlipidemia:Low fat diet discussed and encouraged.   Lipid Panel  Lab Results  Component Value Date   CHOL 257* 12/10/2015   HDL 54 12/10/2015   LDLCALC 178* 12/10/2015   TRIG 123 12/10/2015   CHOLHDL 4.8 12/10/2015

## 2015-12-21 NOTE — Assessment & Plan Note (Signed)
Needs to commit to weekly vit D 

## 2015-12-21 NOTE — Assessment & Plan Note (Signed)
The increased risk of cardiovascular disease associated with this diagnosis, and the need to consistently work on lifestyle to change this is discussed. Following  a  heart healthy diet ,commitment to 30 minutes of exercise at least 5 days per week, as well as control of blood sugar and cholesterol , and achieving a healthy weight are all the areas to be addressed .  

## 2015-12-21 NOTE — Assessment & Plan Note (Signed)
Patient educated about the importance of limiting  Carbohydrate intake , the need to commit to daily physical activity for a minimum of 30 minutes , and to commit weight loss. The fact that changes in all these areas will reduce or eliminate all together the development of diabetes is stressed.  Improved  Diabetic Labs Latest Ref Rng 12/10/2015 07/08/2015 03/02/2015 11/02/2014 07/01/2014  HbA1c <5.7 % 6.2(H) CANCELED 6.4(H) 6.3(H) 6.3(H)  Chol 125 - 200 mg/dL 257(H) 220(H) 218(H) 204(H) -  HDL >=46 mg/dL 54 52 50 60 -  Calc LDL <130 mg/dL 178(H) 148(H) 144(H) 124(H) -  Triglycerides <150 mg/dL 123 101 121 102 -  Creatinine 0.50 - 1.10 mg/dL - 0.80 0.68 - -   BP/Weight 12/14/2015 07/12/2015 03/24/2015 03/04/2015 11/03/2014 07/01/2014 123456  Systolic BP [id number] 123XX123 - 123456 123456 [id number]  Diastolic BP 76 64 - 68 78 66 70  Wt. (Lbs) 193 192.04 190 195 190 183.12 187  BMI 34.2 34.03 33.67 34.55 33.67 32.45 33.13   No flowsheet data found.

## 2015-12-23 ENCOUNTER — Other Ambulatory Visit: Payer: Self-pay | Admitting: Family Medicine

## 2015-12-23 ENCOUNTER — Telehealth: Payer: Self-pay | Admitting: Family Medicine

## 2015-12-23 DIAGNOSIS — M4712 Other spondylosis with myelopathy, cervical region: Secondary | ICD-10-CM

## 2015-12-23 DIAGNOSIS — M5090 Cervical disc disorder, unspecified, unspecified cervical region: Secondary | ICD-10-CM

## 2015-12-23 NOTE — Telephone Encounter (Signed)
States she is ready for an MRI. Her neck pain today is just as bad as day 1. Cannot move her neck. Went to PT today but was in tears and couldn't do it. Sees ortho [name] 9th but wants to go ahead and do the MRI because her pain is at a 10 yesterday and today. Said the only thing that helped was the toradol and depo shots and wants to know if she can get another pain shot tomorrow. Please advise

## 2015-12-23 NOTE — Telephone Encounter (Signed)
Odered, pls offer ativan if needed for sedation,two tabs max,ativan 1mg   15 mins apart  ?? pls ask

## 2015-12-23 NOTE — Telephone Encounter (Signed)
Patient is calling stating that she is in a lot of pain today, she feels like she did on day 1, not getting any better, please advise?

## 2015-12-24 NOTE — Telephone Encounter (Signed)
No ativan needed per patient

## 2015-12-29 ENCOUNTER — Ambulatory Visit (HOSPITAL_COMMUNITY)
Admission: RE | Admit: 2015-12-29 | Discharge: 2015-12-29 | Disposition: A | Discharge status: Home / Self Care | Payer: Federal, State, Local not specified - PPO | Source: Ambulatory Visit | Attending: Family Medicine | Admitting: Family Medicine

## 2015-12-29 DIAGNOSIS — M2578 Osteophyte, vertebrae: Secondary | ICD-10-CM | POA: Insufficient documentation

## 2015-12-29 DIAGNOSIS — M50221 Other cervical disc displacement at C4-C5 level: Secondary | ICD-10-CM | POA: Insufficient documentation

## 2015-12-29 DIAGNOSIS — M5001 Cervical disc disorder with myelopathy,  high cervical region: Secondary | ICD-10-CM | POA: Diagnosis not present

## 2015-12-29 DIAGNOSIS — M4802 Spinal stenosis, cervical region: Secondary | ICD-10-CM | POA: Diagnosis not present

## 2015-12-29 DIAGNOSIS — M5090 Cervical disc disorder, unspecified, unspecified cervical region: Secondary | ICD-10-CM

## 2015-12-29 DIAGNOSIS — M47812 Spondylosis without myelopathy or radiculopathy, cervical region: Secondary | ICD-10-CM | POA: Diagnosis not present

## 2015-12-29 DIAGNOSIS — M542 Cervicalgia: Secondary | ICD-10-CM | POA: Diagnosis present

## 2015-12-29 DIAGNOSIS — M4712 Other spondylosis with myelopathy, cervical region: Secondary | ICD-10-CM

## 2016-01-04 ENCOUNTER — Telehealth: Payer: Self-pay

## 2016-01-04 DIAGNOSIS — M542 Cervicalgia: Secondary | ICD-10-CM

## 2016-01-04 NOTE — Telephone Encounter (Signed)
Patient agrees with referral and would like to see Dr. Nelva Bush for pain management and possible epidural placement

## 2016-01-04 NOTE — Telephone Encounter (Signed)
-----   Message from Fayrene Helper, MD sent at 12/30/2015  7:54 AM EST ----- Pls let her know that there is not much change seen in the MRI of her C spine since 2012, which is good news. She DOES have a lot of arthritis at several levels in her spine  I recommend pain management for epidural injection to c spine if she continues to have a lot of left upper ext pain

## 2016-02-21 ENCOUNTER — Telehealth: Payer: Self-pay | Admitting: Family Medicine

## 2016-02-21 ENCOUNTER — Other Ambulatory Visit: Payer: Self-pay

## 2016-02-21 DIAGNOSIS — M542 Cervicalgia: Secondary | ICD-10-CM

## 2016-02-21 MED ORDER — RANITIDINE HCL 150 MG PO CAPS: 150.0000 mg | ORAL_CAPSULE | Freq: Two times a day (BID) | ORAL | Status: DC

## 2016-02-21 MED ORDER — IBUPROFEN 800 MG PO TABS: 800.0000 mg | ORAL_TABLET | Freq: Three times a day (TID) | ORAL | Status: DC

## 2016-02-21 NOTE — Telephone Encounter (Signed)
Requested medications refilled 

## 2016-02-21 NOTE — Telephone Encounter (Signed)
Patient is calling requesting a refill on ibuprofen (ADVIL,MOTRIN) 800 MG tablet AND ranitidine (ZANTAC) 150 MG capsule  Called to [facility] in [place], please advise?

## 2016-03-01 ENCOUNTER — Other Ambulatory Visit: Payer: Self-pay | Admitting: Family Medicine

## 2016-03-01 DIAGNOSIS — Z1231 Encounter for screening mammogram for malignant neoplasm of breast: Secondary | ICD-10-CM

## 2016-03-16 ENCOUNTER — Ambulatory Visit (HOSPITAL_COMMUNITY)
Admission: RE | Admit: 2016-03-16 | Discharge: 2016-03-16 | Disposition: A | Discharge status: Home / Self Care | Payer: Federal, State, Local not specified - PPO | Source: Ambulatory Visit | Attending: Family Medicine | Admitting: Family Medicine

## 2016-03-16 DIAGNOSIS — Z1231 Encounter for screening mammogram for malignant neoplasm of breast: Secondary | ICD-10-CM

## 2016-03-20 ENCOUNTER — Other Ambulatory Visit: Payer: Self-pay | Admitting: Family Medicine

## 2016-03-28 LAB — LIPID PANEL
CHOLESTEROL: 221 mg/dL — AB (ref 125–200)
HDL: 49 mg/dL (ref >=46)
LDL Cholesterol: 143 mg/dL — ABNORMAL HIGH (ref <130)
TRIGLYCERIDES: 147 mg/dL (ref <150)
Total CHOL/HDL Ratio: 4.5 Ratio (ref <=5.0)
VLDL: 29 mg/dL (ref <30)

## 2016-03-28 LAB — COMPLETE METABOLIC PANEL WITH GFR
ALBUMIN: 3.8 g/dL (ref 3.6–5.1)
ALT: 17 U/L (ref 6–29)
AST: 16 U/L (ref 10–35)
Alkaline Phosphatase: 105 U/L (ref 33–130)
BUN: 13 mg/dL (ref 7–25)
CO2: 26 mmol/L (ref 20–31)
CREATININE: 0.78 mg/dL (ref 0.50–0.99)
Calcium: 9.1 mg/dL (ref 8.6–10.4)
Chloride: 106 mmol/L (ref 98–110)
GFR, Est African American: >89 mL/min (ref >=60)
GFR, Est Non African American: 81 mL/min (ref >=60)
Glucose, Bld: 100 mg/dL — ABNORMAL HIGH (ref 65–99)
POTASSIUM: 4.3 mmol/L (ref 3.5–5.3)
Sodium: 142 mmol/L (ref 135–146)
TOTAL PROTEIN: 6.6 g/dL (ref 6.1–8.1)
Total Bilirubin: 0.5 mg/dL (ref 0.2–1.2)

## 2016-03-28 LAB — HEMOGLOBIN A1C
Hgb A1c MFr Bld: 6.3 % — ABNORMAL HIGH (ref <5.7)
Mean Plasma Glucose: 134 mg/dL

## 2016-03-29 ENCOUNTER — Encounter: Payer: Self-pay | Admitting: Family Medicine

## 2016-03-29 ENCOUNTER — Ambulatory Visit (INDEPENDENT_AMBULATORY_CARE_PROVIDER_SITE_OTHER): Payer: Federal, State, Local not specified - PPO | Admitting: Family Medicine

## 2016-03-29 VITALS — BP 118/76 | HR 80 | Resp 18 | Ht 63.0 in | Wt 192.0 lb

## 2016-03-29 DIAGNOSIS — R7303 Prediabetes: Secondary | ICD-10-CM | POA: Diagnosis not present

## 2016-03-29 DIAGNOSIS — E785 Hyperlipidemia, unspecified: Secondary | ICD-10-CM | POA: Diagnosis not present

## 2016-03-29 DIAGNOSIS — E669 Obesity, unspecified: Secondary | ICD-10-CM

## 2016-03-29 DIAGNOSIS — M79644 Pain in right finger(s): Secondary | ICD-10-CM | POA: Diagnosis not present

## 2016-03-29 DIAGNOSIS — D369 Benign neoplasm, unspecified site: Secondary | ICD-10-CM

## 2016-03-29 DIAGNOSIS — J301 Allergic rhinitis due to pollen: Secondary | ICD-10-CM

## 2016-03-29 DIAGNOSIS — E8881 Metabolic syndrome: Secondary | ICD-10-CM

## 2016-03-29 NOTE — Patient Instructions (Signed)
Annual physical in September, call if you need me sooner  You are referred to Dr Amedeo Plenty re painful middle finger  You need your colonoscopy with Dr Sydell Axon in October this year  Pls start aspirin 81 mg daily to reduce stroke risk  Congrats improved cholesterol , you can get this and sugar niormal by changing diet , so work on this pls  Thank you  for choosing [facility]. We consider it a privelige to serve you.  Delivering excellent health care in a caring and  compassionate way is our goal.  Partnering with you,  so that together we can achieve this goal is our strategy.

## 2016-03-29 NOTE — Progress Notes (Signed)
Subjective:    Patient ID: Stacey Smith, female    DOB: Nov 16, 1951, 65 y.o.   MRN: [id number]  HPI    Stacey Smith     MRN: [id number]      DOB: 11/16/1951   HPI Stacey Smith is here for follow up and re-evaluation of chronic medical conditions, medication management and review of any available recent lab and radiology data.  Preventive health is updated, specifically  Cancer screening and Immunization.   Questions or concerns regarding consultations or procedures which the PT has had in the interim are  addressed. The PT denies any adverse reactions to current medications since the last visit.  Tender painful nodule on middle finger of right hand x 3 weeks, no inciting trauma   ROS Denies recent fever or chills. Denies sinus pressure, nasal congestion, ear pain or sore throat. Denies chest congestion, productive cough or wheezing. Denies chest pains, palpitations and leg swelling Denies abdominal pain, nausea, vomiting,diarrhea or constipation.   Denies dysuria, frequency, hesitancy or incontinence.  Denies headaches, seizures, numbness, or tingling. Denies depression, anxiety or insomnia. Denies skin break down or rash.   PE  BP 118/76 mmHg  Pulse 80  Resp 18  Ht 5\' 3"  (1.6 m)  Wt 192 lb (87.091 kg)  BMI 34.02 kg/m2  SpO2 99%  Patient alert and oriented and in no cardiopulmonary distress.  HEENT: No facial asymmetry, EOMI,   oropharynx pink and moist.  Neck supple no JVD, no mass.  Chest: Clear to auscultation bilaterally.  CVS: S1, S2 no murmurs, no S3.Regular rate.  ABD: Soft non tender.   Ext: No edema  MS: Adequate ROM spine, shoulders, hips and knees.Tender nodule palpable on right middle finger , pea sized and mobile Skin: Intact, no ulcerations or rash noted.  Psych: Good eye contact, normal affect. Memory intact not anxious or depressed appearing.  CNS: CN 2-12 intact, power,  normal throughout.no focal deficits noted.   Assessment &  Plan   Finger pain, right 3 week history , no trauma, painful cyst present, refer ortho  Tubular adenoma F/o intestinal cancer and personal h/o tubular adenoma refer for rept colonoscopy due in 10.2017, pt aware  Hyperlipidemia Hyperlipidemia:Low fat diet discussed and encouraged.   Lipid Panel  Lab Results  Component Value Date   CHOL 221* 03/27/2016   HDL 49 03/27/2016   LDLCALC 143* 03/27/2016   TRIG 147 03/27/2016   CHOLHDL 4.5 03/27/2016   Not at goal, opts to work on dietary change, not taking medication, does not want top do so. Advised consistent aspirin use     ALLERGIC RHINITIS, SEASONAL With season change, encouraged use of medication which works for her , as needed, with a proactive approach to treatment  Obesity (BMI 30.0-34.9) Deteriorated. Patient re-educated about  the importance of commitment to a  minimum of 150 minutes of exercise per week.  The importance of healthy food choices with portion control discussed. Encouraged to start a food diary, count calories and to consider  joining a support group. Sample diet sheets offered. Goals set by the patient for the next several months.   Weight /BMI 03/29/2016 12/29/2015 12/14/2015  WEIGHT 192 lb 185 lb 193 lb  HEIGHT 5\' 3"  5' 3.5" 5\' 3"   BMI 34.02 kg/m2 32.25 kg/m2 34.2 kg/m2    Current exercise per week 120 minutes.   Prediabetes Patient educated about the importance of limiting  Carbohydrate intake , the need to commit to daily physical activity for  a minimum of 30 minutes , and to commit weight loss. The fact that changes in all these areas will reduce or eliminate all together the development of diabetes is stressed.  Deteriorated  Diabetic Labs Latest Ref Rng 03/27/2016 12/10/2015 07/08/2015 03/02/2015 11/02/2014  HbA1c <5.7 % 6.3(H) 6.2(H) CANCELED 6.4(H) 6.3(H)  Chol 125 - 200 mg/dL 221(H) 257(H) 220(H) 218(H) 204(H)  HDL >=46 mg/dL 49 54 52 50 60  Calc LDL <130 mg/dL 143(H) 178(H) 148(H) 144(H)  124(H)  Triglycerides <150 mg/dL 147 123 101 121 102  Creatinine 0.50 - 0.99 mg/dL 0.78 - 0.80 0.68 -   BP/Weight 03/29/2016 12/29/2015 12/14/2015 07/12/2015 03/24/2015 03/04/2015 [id number]  Systolic BP 123456 - XX123456 123XX123 - 123456 123456  Diastolic BP 76 - 76 64 - 68 78  Wt. (Lbs) 192 185 193 192.04 190 195 190  BMI 34.02 32.25 34.2 34.03 33.67 34.55 33.67   No flowsheet data found.     Metabolic syndrome X The increased risk of cardiovascular disease associated with this diagnosis, and the need to consistently work on lifestyle to change this is discussed. Following  a  heart healthy diet ,commitment to 30 minutes of exercise at least 5 days per week, as well as control of blood sugar and cholesterol , and achieving a healthy weight are all the areas to be addressed .       Review of Systems     Objective:   Physical Exam        Assessment & Plan:

## 2016-04-02 NOTE — Assessment & Plan Note (Signed)
3 week history , no trauma, painful cyst present, refer ortho

## 2016-04-02 NOTE — Assessment & Plan Note (Signed)
Hyperlipidemia:Low fat diet discussed and encouraged.   Lipid Panel  Lab Results  Component Value Date   CHOL 221* 03/27/2016   HDL 49 03/27/2016   LDLCALC 143* 03/27/2016   TRIG 147 03/27/2016   CHOLHDL 4.5 03/27/2016   Not at goal, opts to work on dietary change, not taking medication, does not want top do so. Advised consistent aspirin use

## 2016-04-02 NOTE — Assessment & Plan Note (Signed)
With season change, encouraged use of medication which works for her , as needed, with a proactive approach to treatment

## 2016-04-02 NOTE — Assessment & Plan Note (Signed)
Patient educated about the importance of limiting  Carbohydrate intake , the need to commit to daily physical activity for a minimum of 30 minutes , and to commit weight loss. The fact that changes in all these areas will reduce or eliminate all together the development of diabetes is stressed.  Deteriorated  Diabetic Labs Latest Ref Rng 03/27/2016 12/10/2015 07/08/2015 03/02/2015 11/02/2014  HbA1c <5.7 % 6.3(H) 6.2(H) CANCELED 6.4(H) 6.3(H)  Chol 125 - 200 mg/dL 221(H) 257(H) 220(H) 218(H) 204(H)  HDL >=46 mg/dL 49 54 52 50 60  Calc LDL <130 mg/dL 143(H) 178(H) 148(H) 144(H) 124(H)  Triglycerides <150 mg/dL 147 123 101 121 102  Creatinine 0.50 - 0.99 mg/dL 0.78 - 0.80 0.68 -   BP/Weight 03/29/2016 12/29/2015 12/14/2015 07/12/2015 03/24/2015 03/04/2015 [id number]  Systolic BP 123456 - XX123456 123XX123 - 123456 123456  Diastolic BP 76 - 76 64 - 68 78  Wt. (Lbs) 192 185 193 192.04 190 195 190  BMI 34.02 32.25 34.2 34.03 33.67 34.55 33.67   No flowsheet data found.

## 2016-04-02 NOTE — Assessment & Plan Note (Signed)
The increased risk of cardiovascular disease associated with this diagnosis, and the need to consistently work on lifestyle to change this is discussed. Following  a  heart healthy diet ,commitment to 30 minutes of exercise at least 5 days per week, as well as control of blood sugar and cholesterol , and achieving a healthy weight are all the areas to be addressed .  

## 2016-04-02 NOTE — Assessment & Plan Note (Signed)
Deteriorated. Patient re-educated about  the importance of commitment to a  minimum of 150 minutes of exercise per week.  The importance of healthy food choices with portion control discussed. Encouraged to start a food diary, count calories and to consider  joining a support group. Sample diet sheets offered. Goals set by the patient for the next several months.   Weight /BMI 03/29/2016 12/29/2015 12/14/2015  WEIGHT 192 lb 185 lb 193 lb  HEIGHT 5\' 3"  5' 3.5" 5\' 3"   BMI 34.02 kg/m2 32.25 kg/m2 34.2 kg/m2    Current exercise per week 120 minutes.

## 2016-04-02 NOTE — Assessment & Plan Note (Signed)
F/o intestinal cancer and personal h/o tubular adenoma refer for rept colonoscopy due in 10.2017, pt aware

## 2016-04-03 ENCOUNTER — Encounter: Payer: Self-pay | Admitting: Internal Medicine

## 2016-04-26 ENCOUNTER — Ambulatory Visit: Payer: Federal, State, Local not specified - PPO | Admitting: Nurse Practitioner

## 2016-07-27 ENCOUNTER — Other Ambulatory Visit: Payer: Self-pay

## 2016-07-27 MED ORDER — FLUTICASONE PROPIONATE 50 MCG/ACT NA SUSP: 2.0000 | Freq: Every day | NASAL | 3 refills | Status: DC | PRN

## 2016-08-23 ENCOUNTER — Encounter: Payer: Self-pay | Admitting: Internal Medicine

## 2016-08-25 ENCOUNTER — Ambulatory Visit (INDEPENDENT_AMBULATORY_CARE_PROVIDER_SITE_OTHER): Payer: Federal, State, Local not specified - PPO | Admitting: Family Medicine

## 2016-08-25 ENCOUNTER — Encounter: Payer: Self-pay | Admitting: Family Medicine

## 2016-08-25 VITALS — BP 130/80 | HR 83 | Resp 16 | Ht 63.0 in | Wt 190.0 lb

## 2016-08-25 DIAGNOSIS — Z23 Encounter for immunization: Secondary | ICD-10-CM

## 2016-08-25 DIAGNOSIS — L309 Dermatitis, unspecified: Secondary | ICD-10-CM | POA: Diagnosis not present

## 2016-08-25 DIAGNOSIS — M542 Cervicalgia: Secondary | ICD-10-CM | POA: Diagnosis not present

## 2016-08-25 DIAGNOSIS — J3489 Other specified disorders of nose and nasal sinuses: Secondary | ICD-10-CM | POA: Insufficient documentation

## 2016-08-25 MED ORDER — IBUPROFEN 800 MG PO TABS: 800.0000 mg | ORAL_TABLET | Freq: Every day | ORAL | 0 refills | Status: DC | PRN

## 2016-08-25 MED ORDER — METHYLPREDNISOLONE ACETATE 80 MG/ML IJ SUSP
80.0000 mg | Freq: Once | INTRAMUSCULAR | Status: AC
  Administered 2016-08-25: 80 mg via INTRAMUSCULAR

## 2016-08-25 MED ORDER — CEPHALEXIN 500 MG PO CAPS: 500.0000 mg | ORAL_CAPSULE | Freq: Two times a day (BID) | ORAL | 0 refills | Status: DC

## 2016-08-25 MED ORDER — PREDNISONE 5 MG (21) PO TBPK: 5.0000 mg | ORAL_TABLET | ORAL | 0 refills | Status: DC

## 2016-08-25 NOTE — Progress Notes (Signed)
   Stacey Smith     MRN: [id number]      DOB: May 26, 1951   HPI Stacey Smith is here with a 2 week h/o pruritic rash affecting face , limbs and neck. No specific trigger but recalled after wearing a wig , she felt a sting on back of neck, initial lesion was on neck, subsequently has developed areas on right thigh and left cheek. Has been applying benadryl cream and potent steroid. Denies fever or chills, no new detergents or personal care products, lesions itch  ROS Denies recent fever or chills. Denies sinus pressure, nasal congestion, ear pain or sore throat. Denies chest congestion, productive cough or wheezing. Denies chest pains, palpitations and leg swelling Denies abdominal pain, nausea, vomiting,diarrhea or constipation.   Denies dysuria, frequency, hesitancy or incontinence. Denies joint pain, swelling and limitation in mobility.     PE  BP 130/80   Pulse 83   Resp 16   Ht 5\' 3"  (1.6 m)   Wt 190 lb (86.2 kg)   SpO2 98%   BMI 33.66 kg/m   Patient alert and oriented and in no cardiopulmonary distress.  HEENT: No facial asymmetry, EOMI,   oropharynx pink and moist.  Neck supple no JVD, no mass.  Chest: Clear to auscultation bilaterally.  CVS: S1, S2 no murmurs, no S3.Regular rate.  ABD: Soft non tender.   Ext: No edema  MS: Adequate ROM spine, shoulders, hips and knees.  Skin: erythematous macular rash on left cheek, posterior neck and right thigh, crusting noted on lesions on thigh and face  Psych: Good eye contact, normal affect. Memory intact not anxious or depressed appearing.  CNS: CN 2-12 intact, power,  normal throughout.no focal deficits noted.   Assessment & Plan  Dermatitis Acute episode , starting 2 weeks prior, depo medrol IM in office followed by short course of prednisone Bacterial superinfection evident , so antibiotic course also prescribed, pt to discontinue all topical preps to affected skin and call next week if not better will refer to  derm if needed  Need for prophylactic vaccination and inoculation against influenza After obtaining informed consent, the vaccine is  administered by LPN.

## 2016-08-25 NOTE — Patient Instructions (Signed)
F/u as before, call, if you need me before  Flu vaccine today   You are treated  For allergic dermatitis with superinfection   Depo medrol 80 mg IM  Thank you  for choosing [facility]. We consider it a privelige to serve you.  Delivering excellent health care in a caring and  compassionate way is our goal.  Partnering with you,  so that together we can achieve this goal is our strategy.

## 2016-08-28 ENCOUNTER — Encounter: Payer: Self-pay | Admitting: Family Medicine

## 2016-08-28 NOTE — Assessment & Plan Note (Signed)
After obtaining informed consent, the vaccine is  administered by LPN.  

## 2016-08-28 NOTE — Assessment & Plan Note (Signed)
Acute episode , starting 2 weeks prior, depo medrol IM in office followed by short course of prednisone Bacterial superinfection evident , so antibiotic course also prescribed, pt to discontinue all topical preps to affected skin and call next week if not better will refer to derm if needed

## 2016-09-06 ENCOUNTER — Other Ambulatory Visit: Payer: Self-pay | Admitting: Family Medicine

## 2016-09-06 ENCOUNTER — Telehealth: Payer: Self-pay | Admitting: Family Medicine

## 2016-09-06 DIAGNOSIS — R21 Rash and other nonspecific skin eruption: Secondary | ICD-10-CM

## 2016-09-06 NOTE — Telephone Encounter (Signed)
She is referred , advise her to call fopr appt in am , thanks

## 2016-09-06 NOTE — Telephone Encounter (Signed)
Stacey Smith is stating that the rash is no better nothing has helped and she would like a referral to dermatology, please advise?

## 2016-09-06 NOTE — Telephone Encounter (Signed)
Referral entered pls ask her to call for appt info in am, I am referring to soonest available , [place]

## 2016-09-07 NOTE — Telephone Encounter (Signed)
Noted that referral information sent to Dr. Rozann Lesches.

## 2016-09-07 NOTE — Telephone Encounter (Signed)
Scheduled with Dr. Rozann Lesches on Thursday Sept 21st and patient is aware

## 2016-09-12 ENCOUNTER — Telehealth: Payer: Self-pay | Admitting: Family Medicine

## 2016-09-12 NOTE — Telephone Encounter (Signed)
LM to reschedule - Dr. Simpson PAL °

## 2016-09-20 ENCOUNTER — Encounter: Payer: Federal, State, Local not specified - PPO | Admitting: Family Medicine

## 2016-09-27 ENCOUNTER — Telehealth: Payer: Self-pay

## 2016-09-27 NOTE — Telephone Encounter (Signed)
[phone]  PATIENT HAS [employer] AND IS DUE FOR 5 YR TCS, SHE STATES SHE HAS NO PROBLEMS.  POSSIBLE TRIAGE

## 2016-09-27 NOTE — Telephone Encounter (Signed)
Triaged pt today.

## 2016-09-28 NOTE — Telephone Encounter (Signed)
Gastroenterology Pre-Procedure Review  Request Date: 09/27/2016 Requesting Physician: RECALL  PATIENT REVIEW QUESTIONS: The patient responded to the following health history questions as indicated:    PT's last colonoscopy was in 2012 Hx of adenomatous polyps  1. Diabetes Melitis: no 2. Joint replacements in the past 12 months: no 3. Major health problems in the past 3 months: no 4. Has an artificial valve or MVP: no 5. Has a defibrillator: no 6. Has been advised in past to take antibiotics in advance of a procedure like teeth cleaning: no 7. Family history of colon cancer: no  8. Alcohol Use: no 9. History of sleep apnea: no     MEDICATIONS & ALLERGIES:    Patient reports the following regarding taking any blood thinners:   Plavix? no Aspirin? no Coumadin? no  Patient confirms/reports the following medications:  Current Outpatient Prescriptions  Medication Sig Dispense Refill  . fluticasone (FLONASE) 50 MCG/ACT nasal spray Place 2 sprays into both nostrils daily as needed. For allergies 16 g 3  . ibuprofen (ADVIL,MOTRIN) 800 MG tablet Take 1 tablet (800 mg total) by mouth daily as needed. Use only for uncontrolled pain 30 tablet 0  . ranitidine (ZANTAC) 150 MG tablet TAKE 1 CAPSULE (150 MG TOTAL) BY MOUTH 2 (TWO) TIMES DAILY. (Patient taking differently: daily as needed) 60 tablet 2  . aspirin EC 81 MG tablet Take 1 tablet (81 mg total) by mouth daily. 90 tablet 1  . cephALEXin (KEFLEX) 500 MG capsule Take 1 capsule (500 mg total) by mouth 2 (two) times daily. (Patient not taking: Reported on 09/27/2016) 14 capsule 0  . predniSONE (STERAPRED UNI-PAK 21 TAB) 5 MG (21) TBPK tablet Take 1 tablet (5 mg total) by mouth as directed. Use as directed (Patient not taking: Reported on 09/27/2016) 21 tablet 0   No current facility-administered medications for this visit.     Patient confirms/reports the following allergies:  Allergies  Allergen Reactions  . Metformin And Related Nausea  Only    Pt has cholelethiasis  . Tessalon Perles [Benzonatate] Dermatitis    rash    No orders of the defined types were placed in this encounter.   AUTHORIZATION INFORMATION Primary Insurance:   ID #:  Group #:  Pre-Cert / Auth required:  Pre-Cert / Auth #:   Secondary Insurance:   ID #:   Group #:  Pre-Cert / Auth required:  Pre-Cert / Auth #:   SCHEDULE INFORMATION: Procedure has been scheduled as follows:  Date: 11/01/2016                Time:  9:30 AM Location: [facility] Stay  This Gastroenterology Pre-Precedure Review Form is being routed to the following provider(s): R. Garfield Cornea, MD

## 2016-09-29 NOTE — Telephone Encounter (Signed)
Ok to schedule.

## 2016-10-04 ENCOUNTER — Ambulatory Visit: Payer: Federal, State, Local not specified - PPO | Admitting: Gastroenterology

## 2016-10-11 ENCOUNTER — Other Ambulatory Visit: Payer: Self-pay

## 2016-10-11 DIAGNOSIS — Z86010 Personal history of colonic polyps: Secondary | ICD-10-CM

## 2016-10-11 DIAGNOSIS — Z1211 Encounter for screening for malignant neoplasm of colon: Secondary | ICD-10-CM

## 2016-10-11 MED ORDER — PEG 3350-KCL-NA BICARB-NACL 420 G PO SOLR: 4000.0000 mL | ORAL | 0 refills | Status: DC

## 2016-10-11 NOTE — Telephone Encounter (Signed)
Rx sent to the pharmacy and instructions mailed to pt.  

## 2016-10-16 ENCOUNTER — Telehealth: Payer: Self-pay

## 2016-10-16 ENCOUNTER — Other Ambulatory Visit: Payer: Self-pay | Admitting: Family Medicine

## 2016-10-16 DIAGNOSIS — E559 Vitamin D deficiency, unspecified: Secondary | ICD-10-CM

## 2016-10-16 DIAGNOSIS — E782 Mixed hyperlipidemia: Secondary | ICD-10-CM

## 2016-10-16 DIAGNOSIS — R7303 Prediabetes: Secondary | ICD-10-CM

## 2016-10-16 LAB — CBC
HCT: 36.2 % (ref 35.0–45.0)
Hemoglobin: 11.3 g/dL — ABNORMAL LOW (ref 11.7–15.5)
MCH: 22.9 pg — AB (ref 27.0–33.0)
MCHC: 31.2 g/dL — ABNORMAL LOW (ref 32.0–36.0)
MCV: 73.4 fL — AB (ref 80.0–100.0)
MPV: 9.8 fL (ref 7.5–12.5)
PLATELETS: 258 10*3/uL (ref 140–400)
RBC: 4.93 MIL/uL (ref 3.80–5.10)
RDW: 16.8 % — AB (ref 11.0–15.0)
WBC: 8.3 10*3/uL (ref 3.8–10.8)

## 2016-10-16 LAB — TSH: TSH: 1.11 mIU/L

## 2016-10-16 NOTE — Telephone Encounter (Signed)
Expired labs reordered  

## 2016-10-17 LAB — COMPLETE METABOLIC PANEL WITH GFR
ALT: 14 U/L (ref 6–29)
AST: 14 U/L (ref 10–35)
Albumin: 3.3 g/dL — ABNORMAL LOW (ref 3.6–5.1)
Alkaline Phosphatase: 111 U/L (ref 33–130)
BILIRUBIN TOTAL: 0.7 mg/dL (ref 0.2–1.2)
BUN: 11 mg/dL (ref 7–25)
CO2: 27 mmol/L (ref 20–31)
CREATININE: 0.82 mg/dL (ref 0.50–0.99)
Calcium: 9.1 mg/dL (ref 8.6–10.4)
Chloride: 107 mmol/L (ref 98–110)
GFR, EST AFRICAN AMERICAN: 87 mL/min (ref >=60)
GFR, Est Non African American: 76 mL/min (ref >=60)
Glucose, Bld: 88 mg/dL (ref 65–99)
Potassium: 4.3 mmol/L (ref 3.5–5.3)
Sodium: 142 mmol/L (ref 135–146)
TOTAL PROTEIN: 6.4 g/dL (ref 6.1–8.1)

## 2016-10-17 LAB — LIPID PANEL
Cholesterol: 225 mg/dL — ABNORMAL HIGH (ref 125–200)
HDL: 56 mg/dL (ref >=46)
LDL CALC: 146 mg/dL — AB (ref <130)
Total CHOL/HDL Ratio: 4 Ratio (ref <=5.0)
Triglycerides: 113 mg/dL (ref <150)
VLDL: 23 mg/dL (ref <30)

## 2016-10-17 LAB — HEMOGLOBIN A1C
Hgb A1c MFr Bld: 6.2 % — ABNORMAL HIGH (ref <5.7)
Mean Plasma Glucose: 131 mg/dL

## 2016-10-18 ENCOUNTER — Ambulatory Visit (INDEPENDENT_AMBULATORY_CARE_PROVIDER_SITE_OTHER): Payer: Federal, State, Local not specified - PPO | Admitting: Family Medicine

## 2016-10-18 ENCOUNTER — Encounter: Payer: Self-pay | Admitting: Family Medicine

## 2016-10-18 VITALS — BP 126/78 | HR 82 | Resp 18 | Ht 63.0 in | Wt 188.0 lb

## 2016-10-18 DIAGNOSIS — Z0000 Encounter for general adult medical examination without abnormal findings: Secondary | ICD-10-CM

## 2016-10-18 DIAGNOSIS — R059 Cough, unspecified: Secondary | ICD-10-CM

## 2016-10-18 DIAGNOSIS — R05 Cough: Secondary | ICD-10-CM

## 2016-10-18 DIAGNOSIS — R7303 Prediabetes: Secondary | ICD-10-CM

## 2016-10-18 DIAGNOSIS — Z1211 Encounter for screening for malignant neoplasm of colon: Secondary | ICD-10-CM

## 2016-10-18 DIAGNOSIS — E559 Vitamin D deficiency, unspecified: Secondary | ICD-10-CM | POA: Diagnosis not present

## 2016-10-18 DIAGNOSIS — E782 Mixed hyperlipidemia: Secondary | ICD-10-CM | POA: Diagnosis not present

## 2016-10-18 LAB — HEMOCCULT GUIAC POC 1CARD (OFFICE): FECAL OCCULT BLD: NEGATIVE

## 2016-10-18 NOTE — Patient Instructions (Addendum)
F/u in 5 months, call if you need me sooner  All the best with colonoscopy, enjoy family fun this weekend!  Please work on changing food choice, cholesterol and blood sugar still a challenge, and also find some fun exercise, maybe dancing?  Fasting lipid, hBA1c and tSH in 5 months and vit D  Thank you  for choosing [facility]. We consider it a privelige to serve you.  Delivering excellent health care in a caring and  compassionate way is our goal.  Partnering with you,  so that together we can achieve this goal is our strategy.

## 2016-10-20 LAB — B12 AND FOLATE PANEL
Folate: 6.4 ng/mL (ref >5.4)
Vitamin B-12: 376 pg/mL (ref 200–1100)

## 2016-10-20 LAB — IRON AND TIBC
%SAT: 29 % (ref 11–50)
IRON: 88 ug/dL (ref 45–160)
TIBC: 306 ug/dL (ref 250–450)
UIBC: 218 ug/dL (ref 125–400)

## 2016-10-21 NOTE — Assessment & Plan Note (Signed)

## 2016-10-21 NOTE — Progress Notes (Signed)
    Stacey Smith     MRN: [id number]      DOB: 03/01/1951  HPI: Patient is in for annual physical exam. No other health concerns are expressed or addressed at the visit. Recent labs, if available are reviewed. Immunization is reviewed , and  updated if needed.   PE: Pleasant  female, alert and oriented x 3, in no cardio-pulmonary distress. Afebrile. HEENT No facial trauma or asymetry. Sinuses non tender.  Extra occullar muscles intact, pupils equally reactive to light. External ears normal, tympanic membranes clear. Oropharynx moist, no exudate. Neck: supple, no adenopathy,JVD or thyromegaly.No bruits.  Chest: Clear to ascultation bilaterally.No crackles or wheezes. Non tender to palpation  Breast: No asymetry,no masses or lumps. No tenderness. No nipple discharge or inversion. No axillary or supraclavicular adenopathy  Cardiovascular system; Heart sounds normal,  S1 and  S2 ,no S3.  No murmur, or thrill. Apical beat not displaced Peripheral pulses normal.  Abdomen: Soft, non tender, no organomegaly or masses. No bruits. Bowel sounds normal. No guarding, tenderness or rebound.  Rectal:  Normal sphincter tone. No rectal mass. Guaiac negative stool.  GU: External genitalia normal female genitalia , normal female distribution of hair. No lesions. Urethral meatus normal in size, no  Prolapse, no lesions visibly  Present. Bladder non tender. Vagina pink and moist , with no visible lesions , discharge present . Adequate pelvic support no  cystocele or rectocele noted Cervix pink and appears healthy, no lesions or ulcerations noted, no discharge noted from os Uterus absent, no adnexal masses, no  adnexal tenderness.   Musculoskeletal exam: Full ROM of spine, hips , shoulders and knees. No deformity ,swelling or crepitus noted. No muscle wasting or atrophy.   Neurologic: Cranial nerves 2 to 12 intact. Power, tone ,sensation and reflexes normal throughout. No  disturbance in gait. No tremor.  Skin: Intact, no ulceration, or  , scaling noted. Erythematous macular rash noted.on chest  Pigmentation normal throughout  Psych; Normal mood and affect. Judgement and concentration normal   Assessment & Plan:  Annual physical exam Annual exam as documented. Counseling done  re healthy lifestyle involving commitment to 150 minutes exercise per week, heart healthy diet, and attaining healthy weight.The importance of adequate sleep also discussed. Regular seat belt use and home safety, is also discussed. Changes in health habits are decided on by the patient with goals and time frames  set for achieving them. Immunization and cancer screening needs are specifically addressed at this visit.

## 2016-10-25 ENCOUNTER — Telehealth: Payer: Self-pay

## 2016-10-25 NOTE — Telephone Encounter (Signed)
I called pt to update meds prior to procedure. She said there has been no changes since she was triaged.

## 2016-10-25 NOTE — Telephone Encounter (Signed)
I called [employer] @ [phone] and spoke to [place] who said a PA is not required for the screening colonoscopy as out patient.

## 2016-11-01 ENCOUNTER — Encounter (HOSPITAL_COMMUNITY)
Admission: RE | Disposition: A | Discharge status: Home / Self Care | Payer: Self-pay | Source: Ambulatory Visit | Attending: Internal Medicine

## 2016-11-01 ENCOUNTER — Encounter (HOSPITAL_COMMUNITY): Payer: Self-pay | Admitting: *Deleted

## 2016-11-01 ENCOUNTER — Ambulatory Visit (HOSPITAL_COMMUNITY)
Admission: RE | Admit: 2016-11-01 | Discharge: 2016-11-01 | Disposition: A | Discharge status: Home / Self Care | Payer: Federal, State, Local not specified - PPO | Source: Ambulatory Visit | Attending: Internal Medicine | Admitting: Internal Medicine

## 2016-11-01 DIAGNOSIS — Z1211 Encounter for screening for malignant neoplasm of colon: Secondary | ICD-10-CM | POA: Diagnosis not present

## 2016-11-01 DIAGNOSIS — K635 Polyp of colon: Secondary | ICD-10-CM

## 2016-11-01 DIAGNOSIS — K5730 Diverticulosis of large intestine without perforation or abscess without bleeding: Secondary | ICD-10-CM | POA: Insufficient documentation

## 2016-11-01 DIAGNOSIS — D123 Benign neoplasm of transverse colon: Secondary | ICD-10-CM

## 2016-11-01 DIAGNOSIS — D122 Benign neoplasm of ascending colon: Secondary | ICD-10-CM | POA: Diagnosis not present

## 2016-11-01 DIAGNOSIS — K219 Gastro-esophageal reflux disease without esophagitis: Secondary | ICD-10-CM | POA: Insufficient documentation

## 2016-11-01 DIAGNOSIS — R7303 Prediabetes: Secondary | ICD-10-CM | POA: Diagnosis not present

## 2016-11-01 DIAGNOSIS — E669 Obesity, unspecified: Secondary | ICD-10-CM | POA: Insufficient documentation

## 2016-11-01 DIAGNOSIS — Z79899 Other long term (current) drug therapy: Secondary | ICD-10-CM | POA: Diagnosis not present

## 2016-11-01 DIAGNOSIS — Z6832 Body mass index (BMI) 32.0-32.9, adult: Secondary | ICD-10-CM | POA: Diagnosis not present

## 2016-11-01 DIAGNOSIS — E785 Hyperlipidemia, unspecified: Secondary | ICD-10-CM | POA: Insufficient documentation

## 2016-11-01 DIAGNOSIS — Z86010 Personal history of colonic polyps: Secondary | ICD-10-CM

## 2016-11-01 HISTORY — PX: POLYPECTOMY: SHX5525

## 2016-11-01 HISTORY — PX: COLONOSCOPY: SHX5424

## 2016-11-01 SURGERY — COLONOSCOPY
Anesthesia: Moderate Sedation

## 2016-11-01 MED ORDER — MEPERIDINE HCL 100 MG/ML IJ SOLN
INTRAMUSCULAR | Status: DC | PRN
  Administered 2016-11-01: 50 mg via INTRAVENOUS
  Administered 2016-11-01: 25 mg via INTRAVENOUS

## 2016-11-01 MED ORDER — MEPERIDINE HCL 100 MG/ML IJ SOLN
INTRAMUSCULAR | Status: AC
  Filled 2016-11-01: qty 2

## 2016-11-01 MED ORDER — MIDAZOLAM HCL 5 MG/5ML IJ SOLN
INTRAMUSCULAR | Status: AC
  Filled 2016-11-01: qty 10

## 2016-11-01 MED ORDER — ONDANSETRON HCL 4 MG/2ML IJ SOLN
INTRAMUSCULAR | Status: AC
  Filled 2016-11-01: qty 2

## 2016-11-01 MED ORDER — ONDANSETRON HCL 4 MG/2ML IJ SOLN
INTRAMUSCULAR | Status: DC | PRN
  Administered 2016-11-01: 4 mg via INTRAVENOUS

## 2016-11-01 MED ORDER — STERILE WATER FOR IRRIGATION IR SOLN
Status: DC | PRN
  Administered 2016-11-01: 09:00:00

## 2016-11-01 MED ORDER — MIDAZOLAM HCL 5 MG/5ML IJ SOLN
INTRAMUSCULAR | Status: DC | PRN
  Administered 2016-11-01: 1 mg via INTRAVENOUS
  Administered 2016-11-01: 2 mg via INTRAVENOUS
  Administered 2016-11-01 (×2): 1 mg via INTRAVENOUS

## 2016-11-01 MED ORDER — SODIUM CHLORIDE 0.9 % IV SOLN
INTRAVENOUS | Status: DC
  Administered 2016-11-01: 09:00:00 via INTRAVENOUS

## 2016-11-01 NOTE — H&P (Signed)
@LOGO @   Primary Care Physician:  Tula Nakayama, MD Primary Gastroenterologist:  Dr. Gala Romney  Pre-Procedure History & Physical: HPI:  Stacey Smith is a 65 y.o. female here for surveillance colonoscopy. History of colonic adenoma removed 2007. No bowel symptoms.  Past Medical History:  Diagnosis Date  . Back pain   . Borderline diabetes   . Colon polyps   . GERD (gastroesophageal reflux disease)   . Headache(784.0)   . Hyperlipidemia   . Obesity   . S/P colonoscopy Oct 2007   Normal rectum, diminutive polyps in the left colon cold: TUBULAR ADENOMA  . TMJ (dislocation of temporomandibular joint)     Past Surgical History:  Procedure Laterality Date  . COLONOSCOPY  10/03/2011   Procedure: COLONOSCOPY;  Surgeon: Daneil Dolin, MD;  Location: [facility];  Service: Endoscopy;  Laterality: N/A;  10:30  . excision of cyst for thyroid gland non cancer  1988  . TMJ ARTHROPLASTY  09/23/2012   Procedure: TEMPOROMANDIBULAR JOINT (TMJ) ARTHROPLASTY;  Surgeon: Gae Bon, DDS;  Location: [facility];  Service: Oral Surgery;  Laterality: Left;  Temporomandibular joint arthrotomy and meniscectomy  . TONSILLECTOMY  1960  . TOTAL ABDOMINAL HYSTERECTOMY  1985   right ovary fibroids     Prior to Admission medications   Medication Sig Start Date End Date Taking? Authorizing Provider  fluticasone (FLONASE) 50 MCG/ACT nasal spray Place 2 sprays into both nostrils daily as needed. For allergies 07/27/16  Yes Fayrene Helper, MD  ibuprofen (ADVIL,MOTRIN) 800 MG tablet Take 800 mg by mouth daily as needed for headache or moderate pain.   Yes Historical Provider, MD  ranitidine (ZANTAC) 150 MG tablet Take 150 mg by mouth daily as needed for heartburn.    Historical Provider, MD    Allergies as of 10/11/2016 - Review Complete 09/27/2016  Allergen Reaction Noted  . Metformin and related Nausea Only 11/03/2014  . Tessalon perles [benzonatate] Dermatitis 07/12/2015    Family History  Problem  Relation Age of Onset  . Stroke Mother   . Hypertension Mother   . Cancer Mother     breast   . Glaucoma Mother   . Asthma Mother   . Coronary artery disease Father   . Diabetes Father   . Heart failure Father   . Kidney disease Father   . Diabetes Sister   . Hypertension Sister   . Diabetes Sister   . Cancer Sister     colon   . Diabetes Sister   . Thyroid disease Sister   . Depression Sister   . Bipolar disorder Sister   . Colon cancer Sister     unsure what age diagnosed    Social History   Social History  . Marital status: Divorced    Spouse name: N/A  . Number of children: 2  . Years of education: N/A   Occupational History  . 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 Smoking status: Never Smoker  . Smokeless tobacco: Never Used  . Alcohol use Yes     Comment: occasionally   . Drug use: No  . Sexual activity: Not on file   Other Topics Concern  . Not on file   Social History Narrative  . No narrative on file    Review of Systems: See HPI, otherwise negative ROS  Physical Exam: BP 125/72   Pulse 69   Temp 97.6 F (36.4 C) (Oral)  Resp 12   Ht 5\' 3"  (1.6 m)   Wt 185 lb (83.9 kg)   SpO2 100%   BMI 32.77 kg/m  General:   Alert,  Well-developed, well-nourished, pleasant and cooperative in NAD Skin:  Intact without significant lesions or rashes. Neck:  Supple; no masses or thyromegaly. No significant cervical adenopathy. Lungs:  Clear throughout to auscultation.   No wheezes, crackles, or rhonchi. No acute distress. Heart:  Regular rate and rhythm; no murmurs, clicks, rubs,  or gallops. Abdomen: Non-distended, normal bowel sounds.  Soft and nontender without appreciable mass or hepatosplenomegaly.  Pulses:  Normal pulses noted. Extremities:  Without clubbing or edema.  Impression:  Pleasant 65 year old lady with history of colonic adenoma. Her for surveillance colonoscopy. The risks, benefits,  limitations, alternatives and imponderables have been reviewed with the patient. Questions have been answered. All parties are agreeable.      Notice: This dictation was prepared with Dragon dictation along with smaller phrase technology. Any transcriptional errors that result from this process are unintentional and may not be corrected upon review.

## 2016-11-01 NOTE — Discharge Instructions (Addendum)
Colonoscopy Discharge Instructions  Read the instructions outlined below and refer to this sheet in the next few weeks. These discharge instructions provide you with general information on caring for yourself after you leave the hospital. Your doctor may also give you specific instructions. While your treatment has been planned according to the most current medical practices available, unavoidable complications occasionally occur. If you have any problems or questions after discharge, call Dr. Gala Romney at [phone]. ACTIVITY  You may resume your regular activity, but move at a slower pace for the next 24 hours.   Take frequent rest periods for the next 24 hours.   Walking will help get rid of the air and reduce the bloated feeling in your belly (abdomen).   No driving for 24 hours (because of the medicine (anesthesia) used during the test).    Do not sign any important legal documents or operate any machinery for 24 hours (because of the anesthesia used during the test).  NUTRITION  Drink plenty of fluids.   You may resume your normal diet as instructed by your doctor.   Begin with a light meal and progress to your normal diet. Heavy or fried foods are harder to digest and may make you feel sick to your stomach (nauseated).   Avoid alcoholic beverages for 24 hours or as instructed.  MEDICATIONS  You may resume your normal medications unless your doctor tells you otherwise.  WHAT YOU CAN EXPECT TODAY  Some feelings of bloating in the abdomen.   Passage of more gas than usual.   Spotting of blood in your stool or on the toilet paper.  IF YOU HAD POLYPS REMOVED DURING THE COLONOSCOPY:  No aspirin products for 7 days or as instructed.   No alcohol for 7 days or as instructed.   Eat a soft diet for the next 24 hours.  FINDING OUT THE RESULTS OF YOUR TEST Not all test results are available during your visit. If your test results are not back during the visit, make an appointment  with your caregiver to find out the results. Do not assume everything is normal if you have not heard from your caregiver or the medical facility. It is important for you to follow up on all of your test results.  SEEK IMMEDIATE MEDICAL ATTENTION IF:  You have more than a spotting of blood in your stool.   Your belly is swollen (abdominal distention).   You are nauseated or vomiting.   You have a temperature over 101.   You have abdominal pain or discomfort that is severe or gets worse throughout the day.    Colon polyp and diverticulosis information provided  Further recommendations to follow pending review of pathology    Colon Polyps Polyps are lumps of extra tissue growing inside the body. Polyps can grow in the large intestine (colon). Most colon polyps are noncancerous (benign). However, some colon polyps can become cancerous over time. Polyps that are larger than a pea may be harmful. To be safe, caregivers remove and test all polyps. CAUSES  Polyps form when mutations in the genes cause your cells to grow and divide even though no more tissue is needed. RISK FACTORS There are a number of risk factors that can increase your chances of getting colon polyps. They include:  Being older than 50 years.  Family history of colon polyps or colon cancer.  Long-term colon diseases, such as colitis or Crohn disease.  Being overweight.  Smoking.  Being inactive.  Drinking  too much alcohol. SYMPTOMS  Most small polyps do not cause symptoms. If symptoms are present, they may include:  Blood in the stool. The stool may look dark red or black.  Constipation or diarrhea that lasts longer than 1 week. DIAGNOSIS People often do not know they have polyps until their caregiver finds them during a regular checkup. Your caregiver can use 4 tests to check for polyps:  Digital rectal exam. The caregiver wears gloves and feels inside the rectum. This test would find polyps only in the  rectum.  Barium enema. The caregiver puts a liquid called barium into your rectum before taking X-rays of your colon. Barium makes your colon look white. Polyps are dark, so they are easy to see in the X-ray pictures.  Sigmoidoscopy. A thin, flexible tube (sigmoidoscope) is placed into your rectum. The sigmoidoscope has a light and tiny camera in it. The caregiver uses the sigmoidoscope to look at the last third of your colon.  Colonoscopy. This test is like sigmoidoscopy, but the caregiver looks at the entire colon. This is the most common method for finding and removing polyps. TREATMENT  Any polyps will be removed during a sigmoidoscopy or colonoscopy. The polyps are then tested for cancer. PREVENTION  To help lower your risk of getting more colon polyps:  Eat plenty of fruits and vegetables. Avoid eating fatty foods.  Do not smoke.  Avoid drinking alcohol.  Exercise every day.  Lose weight if recommended by your caregiver.  Eat plenty of calcium and folate. Foods that are rich in calcium include milk, cheese, and broccoli. Foods that are rich in folate include chickpeas, kidney beans, and spinach. HOME CARE INSTRUCTIONS Keep all follow-up appointments as directed by your caregiver. You may need periodic exams to check for polyps. SEEK MEDICAL CARE IF: You notice bleeding during a bowel movement.   This information is not intended to replace advice given to you by your health care provider. Make sure you discuss any questions you have with your health care provider.   Document Released: 09/06/2004 Document Revised: 01/01/2015 Document Reviewed: 02/20/2012 Elsevier Interactive Patient Education 2016 [employer].     Diverticulosis Diverticulosis is the condition that develops when small pouches (diverticula) form in the wall of your colon. Your colon, or large intestine, is where water is absorbed and stool is formed. The pouches form when the inside layer of your colon  pushes through weak spots in the outer layers of your colon. CAUSES  No one knows exactly what causes diverticulosis. RISK FACTORS  Being older than 48. Your risk for this condition increases with age. Diverticulosis is rare in people younger than 40 years. By age 35, almost everyone has it.  Eating a low-fiber diet.  Being frequently constipated.  Being overweight.  Not getting enough exercise.  Smoking.  Taking over-the-counter pain medicines, like aspirin and ibuprofen. SYMPTOMS  Most people with diverticulosis do not have symptoms. DIAGNOSIS  Because diverticulosis often has no symptoms, health care providers often discover the condition during an exam for other colon problems. In many cases, a health care provider will diagnose diverticulosis while using a flexible scope to examine the colon (colonoscopy). TREATMENT  If you have never developed an infection related to diverticulosis, you may not need treatment. If you have had an infection before, treatment may include:  Eating more fruits, vegetables, and grains.  Taking a fiber supplement.  Taking a live bacteria supplement (probiotic).  Taking medicine to relax your colon. HOME CARE INSTRUCTIONS  Drink at least 6-8 glasses of water each day to prevent constipation.  Try not to strain when you have a bowel movement.  Keep all follow-up appointments. If you have had an infection before:  Increase the fiber in your diet as directed by your health care provider or dietitian.  Take a dietary fiber supplement if your health care provider approves.  Only take medicines as directed by your health care provider. SEEK MEDICAL CARE IF:   You have abdominal pain.  You have bloating.  You have cramps.  You have not gone to the bathroom in 3 days. SEEK IMMEDIATE MEDICAL CARE IF:   Your pain gets worse.  Yourbloating becomes very bad.  You have a fever or chills, and your symptoms suddenly get worse.  You  begin vomiting.  You have bowel movements that are bloody or black. MAKE SURE YOU:  Understand these instructions.  Will watch your condition.  Will get help right away if you are not doing well or get worse.   This information is not intended to replace advice given to you by your health care provider. Make sure you discuss any questions you have with your health care provider.   Document Released: 09/07/2004 Document Revised: 12/16/2013 Document Reviewed: 11/05/2013 Elsevier Interactive Patient Education 2016 Elsevier Inc.  High-Fiber Diet Fiber, also called dietary fiber, is a type of carbohydrate found in fruits, vegetables, whole grains, and beans. A high-fiber diet can have many health benefits. Your health care provider may recommend a high-fiber diet to help:  Prevent constipation. Fiber can make your bowel movements more regular.  Lower your cholesterol.  Relieve hemorrhoids, uncomplicated diverticulosis, or irritable bowel syndrome.  Prevent overeating as part of a weight-loss plan.  Prevent heart disease, type 2 diabetes, and certain cancers. WHAT IS MY PLAN? The recommended daily intake of fiber includes:  38 grams for men under age 74.  15 grams for men over age 48.  29 grams for women under age 70.  44 grams for women over age 71. You can get the recommended daily intake of dietary fiber by eating a variety of fruits, vegetables, grains, and beans. Your health care provider may also recommend a fiber supplement if it is not possible to get enough fiber through your diet. WHAT DO I NEED TO KNOW ABOUT A HIGH-FIBER DIET?  Fiber supplements have not been widely studied for their effectiveness, so it is better to get fiber through food sources.  Always check the fiber content on thenutrition facts label of any prepackaged food. Look for foods that contain at least 5 grams of fiber per serving.  Ask your dietitian if you have questions about specific foods that  are related to your condition, especially if those foods are not listed in the following section.  Increase your daily fiber consumption gradually. Increasing your intake of dietary fiber too quickly may cause bloating, cramping, or gas.  Drink plenty of water. Water helps you to digest fiber. WHAT FOODS CAN I EAT? Grains Whole-grain breads. Multigrain cereal. Oats and oatmeal. Brown rice. Barley. Bulgur wheat. [place]. Bran muffins. Popcorn. Rye wafer crackers. Vegetables Sweet potatoes. Spinach. Kale. Artichokes. Cabbage. Broccoli. Green peas. Carrots. Squash. Fruits Berries. Pears. Apples. Oranges. Avocados. Prunes and raisins. Dried figs. Meats and Other Protein Sources Navy, kidney, pinto, and soy beans. Split peas. Lentils. Nuts and seeds. Dairy Fiber-fortified yogurt. Beverages Fiber-fortified soy milk. Fiber-fortified orange juice. Other Fiber bars. The items listed above may not be a complete list of recommended  foods or beverages. Contact your dietitian for more options. WHAT FOODS ARE NOT RECOMMENDED? Grains White bread. Pasta made with refined flour. White rice. Vegetables Fried potatoes. Canned vegetables. Well-cooked vegetables.  Fruits Fruit juice. Cooked, strained fruit. Meats and Other Protein Sources Fatty cuts of meat. Fried Sales executive or fried fish. Dairy Milk. Yogurt. Cream cheese. Sour cream. Beverages Soft drinks. Other Cakes and pastries. Butter and oils. The items listed above may not be a complete list of foods and beverages to avoid. Contact your dietitian for more information. WHAT ARE SOME TIPS FOR INCLUDING HIGH-FIBER FOODS IN MY DIET?  Eat a wide variety of high-fiber foods.  Make sure that half of all grains consumed each day are whole grains.  Replace breads and cereals made from refined flour or white flour with whole-grain breads and cereals.  Replace white rice with brown rice, bulgur wheat, or millet.  Start the day with a breakfast that  is high in fiber, such as a cereal that contains at least 5 grams of fiber per serving.  Use beans in place of meat in soups, salads, or pasta.  Eat high-fiber snacks, such as berries, raw vegetables, nuts, or popcorn.   This information is not intended to replace advice given to you by your health care provider. Make sure you discuss any questions you have with your health care provider.   Document Released: 12/11/2005 Document Revised: 01/01/2015 Document Reviewed: 05/26/2014 Elsevier Interactive Patient Education [employer].

## 2016-11-01 NOTE — Op Note (Signed)
[facility] Patient Name: Stacey Smith Procedure Date: 11/01/2016 9:18 AM MRN: [id number] Date of Birth: Nov 24, 1951 Attending MD: Norvel Richards , MD CSN: [id number] Age: 65 Admit Type: Outpatient Procedure:                Colonoscopy with multiple snare polypectomies Indications:              High risk colon cancer surveillance: Personal                            history of colonic polyps Providers:                Norvel Richards, MD, Otis Peak B. Sharon Seller, RN,                            Isabella Stalling, Technician Referring MD:             Norwood Levo. Simpson MD, MD Medicines:                Midazolam 5 mg IV, Meperidine 75 mg IV, Ondansetron                            4 mg IV Complications:            No immediate complications. Estimated Blood Loss:     Estimated blood loss was minimal. Procedure:                Pre-Anesthesia Assessment:                           - Prior to the procedure, a History and Physical                            was performed, and patient medications and                            allergies were reviewed. The patient's tolerance of                            previous anesthesia was also reviewed. The risks                            and benefits of the procedure and the sedation                            options and risks were discussed with the patient.                            All questions were answered, and informed consent                            was obtained. Prior Anticoagulants: The patient has                            taken no previous anticoagulant or antiplatelet  agents. ASA Grade Assessment: II - A patient with                            mild systemic disease. After reviewing the risks                            and benefits, the patient was deemed in                            satisfactory condition to undergo the procedure.                           After obtaining informed consent, the  colonoscope                            was passed under direct vision. Throughout the                            procedure, the patient's blood pressure, pulse, and                            oxygen saturations were monitored continuously. The                            EC-[id number] scope was introduced through                            the anus and advanced to the the cecum, identified                            by appendiceal orifice and ileocecal valve. The                            colonoscopy was performed without difficulty. The                            patient tolerated the procedure well. The quality                            of the bowel preparation was adequate. The                            ileocecal valve, appendiceal orifice, and rectum                            were photographed. The entire colon was well                            visualized. Scope In: 9:31:02 AM Scope Out: 9:52:20 AM Scope Withdrawal Time: 0 hours 12 minutes 42 seconds  Total Procedure Duration: 0 hours 21 minutes 18 seconds  Findings:      The perianal and digital rectal examinations were normal.      (2) 3-5 mm polyp were found in the splenic flexure.  The polyps were       semi-pedunculated. The polyps were removed with a cold snare. Resection       and retrieval were complete. Estimated blood loss was minimal.      A 7 mm polyp was found in the hepatic flexure. The polyp was       pedunculated. The polyp was removed with a hot snare. Resection and       retrieval were complete. Estimated blood loss: none.      Multiple small and large-mouthed diverticula were found in the sigmoid       colon and descending colon.      The exam was otherwise without abnormality on direct and retroflexion       views. Impression:               - Multiple colonic polyps removed as described                            above. Colonic diverticulosis. Moderate Sedation:      Moderate (conscious) sedation was  administered by the endoscopy nurse       and supervised by the endoscopist. The following parameters were       monitored: oxygen saturation, heart rate, blood pressure, respiratory       rate, EKG, adequacy of pulmonary ventilation, and response to care.       Total physician intraservice time was 24 minutes. Recommendation:           - Patient has a contact number available for                            emergencies. The signs and symptoms of potential                            delayed complications were discussed with the                            patient. Return to normal activities tomorrow.                            Written discharge instructions were provided to the                            patient.                           - Patient has a contact number available for                            emergencies. The signs and symptoms of potential                            delayed complications were discussed with the                            patient. Return to normal activities tomorrow.                            Written discharge  instructions were provided to the                            patient.                           - Resume previous diet.                           - Continue present medications.                           - Repeat colonoscopy date to be determined after                            pending pathology results are reviewed for                            surveillance based on pathology results.                           - Return to GI office (date not yet determined). Procedure Code(s):        --- Professional ---                           [phone], Colonoscopy, flexible; with removal of                            tumor(s), polyp(s), or other lesion(s) by snare                            technique                           99152, Moderate sedation services provided by the                            same physician or other qualified health care                             professional performing the diagnostic or                            therapeutic service that the sedation supports,                            requiring the presence of an independent trained                            observer to assist in the monitoring of the                            patient's level of consciousness and physiological                            status; initial 15 minutes of  intraservice time,                            patient age 80 years or older                           [phone], Moderate sedation services; each additional                            15 minutes intraservice time Diagnosis Code(s):        --- Professional ---                           Z86.010, Personal history of colonic polyps                           D12.5, Benign neoplasm of sigmoid colon                           D12.3, Benign neoplasm of transverse colon (hepatic                            flexure or splenic flexure)                           K57.30, Diverticulosis of large intestine without                            perforation or abscess without bleeding CPT copyright 2016 American Medical Association. All rights reserved. The codes documented in this report are preliminary and upon coder review may  be revised to meet current compliance requirements. Cristopher Estimable. Adonia Porada, MD Norvel Richards, MD 11/01/2016 10:05:24 AM This report has been signed electronically. Number of Addenda: 0

## 2016-11-02 ENCOUNTER — Encounter: Payer: Self-pay | Admitting: Internal Medicine

## 2016-11-03 ENCOUNTER — Encounter (HOSPITAL_COMMUNITY): Payer: Self-pay | Admitting: Internal Medicine

## 2017-01-23 ENCOUNTER — Other Ambulatory Visit: Payer: Self-pay

## 2017-01-23 MED ORDER — FLUTICASONE PROPIONATE 50 MCG/ACT NA SUSP: 2.0000 | Freq: Every day | NASAL | 1 refills | Status: DC | PRN

## 2017-03-20 LAB — LIPID PANEL
Cholesterol: 227 mg/dL — ABNORMAL HIGH (ref <200)
HDL: 50 mg/dL — ABNORMAL LOW (ref >50)
LDL Cholesterol: 144 mg/dL — ABNORMAL HIGH (ref <100)
Total CHOL/HDL Ratio: 4.5 Ratio (ref <5.0)
Triglycerides: 164 mg/dL — ABNORMAL HIGH (ref <150)
VLDL: 33 mg/dL — ABNORMAL HIGH (ref <30)

## 2017-03-20 LAB — HEMOGLOBIN A1C
Hgb A1c MFr Bld: 6.2 % — ABNORMAL HIGH (ref <5.7)
Mean Plasma Glucose: 131 mg/dL

## 2017-03-20 LAB — TSH: TSH: 1.01 mIU/L

## 2017-03-20 LAB — VITAMIN D 25 HYDROXY (VIT D DEFICIENCY, FRACTURES): Vit D, 25-Hydroxy: 18 ng/mL — ABNORMAL LOW (ref 30–100)

## 2017-03-21 ENCOUNTER — Ambulatory Visit (INDEPENDENT_AMBULATORY_CARE_PROVIDER_SITE_OTHER): Payer: Federal, State, Local not specified - PPO | Admitting: Family Medicine

## 2017-03-21 ENCOUNTER — Encounter: Payer: Self-pay | Admitting: Family Medicine

## 2017-03-21 VITALS — BP 138/80 | HR 92 | Temp 97.9°F | Resp 18 | Ht 63.0 in | Wt 193.1 lb

## 2017-03-21 DIAGNOSIS — R7303 Prediabetes: Secondary | ICD-10-CM

## 2017-03-21 DIAGNOSIS — E8881 Metabolic syndrome: Secondary | ICD-10-CM

## 2017-03-21 DIAGNOSIS — Z723 Lack of physical exercise: Secondary | ICD-10-CM

## 2017-03-21 DIAGNOSIS — E669 Obesity, unspecified: Secondary | ICD-10-CM | POA: Diagnosis not present

## 2017-03-21 DIAGNOSIS — E784 Other hyperlipidemia: Secondary | ICD-10-CM

## 2017-03-21 DIAGNOSIS — E7849 Other hyperlipidemia: Secondary | ICD-10-CM

## 2017-03-21 DIAGNOSIS — L309 Dermatitis, unspecified: Secondary | ICD-10-CM | POA: Diagnosis not present

## 2017-03-21 DIAGNOSIS — E66811 Obesity, class 1: Secondary | ICD-10-CM

## 2017-03-21 MED ORDER — RANITIDINE HCL 150 MG PO TABS: 150.0000 mg | ORAL_TABLET | Freq: Every day | ORAL | 1 refills | Status: DC | PRN

## 2017-03-21 MED ORDER — IBUPROFEN 800 MG PO TABS: 800.0000 mg | ORAL_TABLET | Freq: Every day | ORAL | 0 refills | Status: DC | PRN

## 2017-03-21 NOTE — Patient Instructions (Signed)
Welcome to  Medicare in 6 months, call if you need me before  Overall improved cholesterol in past year, blood sugar still needs work, you will do both  It is important that you exercise regularly at least 30 minutes 5 times a week. If you develop chest pain, have severe difficulty breathing, or feel very tired, stop exercising immediately and seek medical attention   Please work on good  health habits so that your health will improve. 1. Commitment to daily physical activity for 30 to 60  minutes, if you are able to do this.  2. Commitment to wise food choices. Aim for half of your  food intake to be vegetable and fruit, one quarter starchy foods, and one quarter protein. Try to eat on a regular schedule  3 meals per day, snacking between meals should be limited to vegetables or fruits or small portions of nuts. 64 ounces of water per day is generally recommended, unless you have specific health conditions, like heart failure or kidney failure where you will need to limit fluid intake.  3. Commitment to sufficient and a  good quality of physical and mental rest daily, generally between 6 to 8 hours per day.  WITH PERSISTANCE AND PERSEVERANCE, THE IMPOSSIBLE , BECOMES THE NORM!

## 2017-03-26 NOTE — Assessment & Plan Note (Signed)
Hyperlipidemia:Low fat diet discussed and encouraged.   Lipid Panel  Lab Results  Component Value Date   CHOL 227 (H) 03/19/2017   HDL 50 (L) 03/19/2017   LDLCALC 144 (H) 03/19/2017   TRIG 164 (H) 03/19/2017   CHOLHDL 4.5 03/19/2017  needs to continue to work on improved cholesterol and intends to do so

## 2017-03-26 NOTE — Assessment & Plan Note (Signed)
No regular exercise commitment , re educated re need to change this for health, initial commitment is 3 times weekly

## 2017-03-26 NOTE — Assessment & Plan Note (Signed)
No current flare however excellent response to topical steroid, high potency

## 2017-03-26 NOTE — Progress Notes (Signed)
Stacey Smith     MRN: [id number]      DOB: [birth year]   HPI Stacey Smith is here for follow up and re-evaluation of chronic medical conditions, medication management and review of any available recent lab and radiology data.  Preventive health is updated, specifically  Cancer screening and Immunization.   Questions or concerns regarding consultations or procedures which the PT has had in the interim are  addressed. The PT denies any adverse reactions to current medications since the last visit.  There are no new concerns.  There are no specific complaints   ROS Denies recent fever or chills. Denies sinus pressure, nasal congestion, ear pain or sore throat. Denies chest congestion, productive cough or wheezing. Denies chest pains, palpitations and leg swelling Denies abdominal pain, nausea, vomiting,diarrhea or constipation.   Denies dysuria, frequency, hesitancy or incontinence. Denies joint pain, swelling and limitation in mobility. Denies headaches, seizures, numbness, or tingling. Denies depression, anxiety or insomnia. Denies skin break down or rash.   PE  BP 138/80 (BP Location: Left Arm, Patient Position: Sitting, Cuff Size: Normal)   Pulse 92   Temp 97.9 F (36.6 C) (Temporal)   Resp 18   Ht 5\' 3"  (1.6 m)   Wt 193 lb 1.9 oz (87.6 kg)   SpO2 97%   BMI 34.21 kg/m   Patient alert and oriented and in no cardiopulmonary distress.  HEENT: No facial asymmetry, EOMI,   oropharynx pink and moist.  Neck supple no JVD, no mass.  Chest: Clear to auscultation bilaterally.  CVS: S1, S2 no murmurs, no S3.Regular rate.  ABD: Soft non tender.   Ext: No edema  MS: Adequate ROM spine, shoulders, hips and knees.  Skin: Intact, no ulcerations or rash noted.  Psych: Good eye contact, normal affect. Memory intact not anxious or depressed appearing.  CNS: CN 2-12 intact, power,  normal throughout.no focal deficits noted.   Assessment &  Plan  Hyperlipidemia Hyperlipidemia:Low fat diet discussed and encouraged.   Lipid Panel  Lab Results  Component Value Date   CHOL 227 (H) 03/19/2017   HDL 50 (L) 03/19/2017   LDLCALC 144 (H) 03/19/2017   TRIG 164 (H) 03/19/2017   CHOLHDL 4.5 03/19/2017  needs to continue to work on improved cholesterol and intends to do so     Dermatitis No current flare however excellent response to topical steroid, high potency  Prediabetes Patient educated about the importance of limiting  Carbohydrate intake , the need to commit to daily physical activity for a minimum of 30 minutes , and to commit weight loss. The fact that changes in all these areas will reduce or eliminate all together the development of diabetes is stressed.  Unchanged, needs to work more consistently on improvement  Diabetic Labs Latest Ref Rng & Units 03/19/2017 10/16/2016 03/27/2016 12/10/2015 07/08/2015  HbA1c <5.7 % 6.2(H) 6.2(H) 6.3(H) 6.2(H) CANCELED  Chol <200 mg/dL 227(H) 225(H) 221(H) 257(H) 220(H)  HDL >50 mg/dL 50(L) 56 49 54 52  Calc LDL <100 mg/dL 144(H) 146(H) 143(H) 178(H) 148(H)  Triglycerides <150 mg/dL 164(H) 113 147 123 101  Creatinine 0.50 - 0.99 mg/dL - 0.82 0.78 - 0.80   BP/Weight 03/21/2017 11/01/2016 10/18/2016 08/25/2016 03/29/2016 12/29/2015 06/23/1600  Systolic BP 093 235 573 220 254 - 270  Diastolic BP 80 58 78 80 76 - 76  Wt. (Lbs) 193.12 185 188 190 192 185 193  BMI 34.21 32.77 33.3 33.66 34.02 32.25 34.2   No flowsheet data found.  Obesity (BMI 30.0-34.9) Deteriorated. Patient re-educated about  the importance of commitment to a  minimum of 150 minutes of exercise per week.  The importance of healthy food choices with portion control discussed. Encouraged to start a food diary, count calories and to consider  joining a support group. Sample diet sheets offered. Goals set by the patient for the next several months.   Weight /BMI 03/21/2017 11/01/2016 10/18/2016  WEIGHT 193 lb 1.9 oz  185 lb 188 lb  HEIGHT 5\' 3"  5\' 3"  5\' 3"   BMI 34.21 kg/m2 32.77 kg/m2 33.3 kg/m2      Lack of physical exercise No regular exercise commitment , re educated re need to change this for health, initial commitment is 3 times weekly  Metabolic syndrome X The increased risk of cardiovascular disease associated with this diagnosis, and the need to consistently work on lifestyle to change this is discussed. Following  a  heart healthy diet ,commitment to 30 minutes of exercise at least 5 days per week, as well as control of blood sugar and cholesterol , and achieving a healthy weight are all the areas to be addressed .

## 2017-03-26 NOTE — Assessment & Plan Note (Signed)
Deteriorated. Patient re-educated about  the importance of commitment to a  minimum of 150 minutes of exercise per week.  The importance of healthy food choices with portion control discussed. Encouraged to start a food diary, count calories and to consider  joining a support group. Sample diet sheets offered. Goals set by the patient for the next several months.   Weight /BMI 03/21/2017 11/01/2016 10/18/2016  WEIGHT 193 lb 1.9 oz 185 lb 188 lb  HEIGHT 5\' 3"  5\' 3"  5\' 3"   BMI 34.21 kg/m2 32.77 kg/m2 33.3 kg/m2

## 2017-03-26 NOTE — Assessment & Plan Note (Signed)
The increased risk of cardiovascular disease associated with this diagnosis, and the need to consistently work on lifestyle to change this is discussed. Following  a  heart healthy diet ,commitment to 30 minutes of exercise at least 5 days per week, as well as control of blood sugar and cholesterol , and achieving a healthy weight are all the areas to be addressed .  

## 2017-03-26 NOTE — Assessment & Plan Note (Signed)
Patient educated about the importance of limiting  Carbohydrate intake , the need to commit to daily physical activity for a minimum of 30 minutes , and to commit weight loss. The fact that changes in all these areas will reduce or eliminate all together the development of diabetes is stressed.  Unchanged, needs to work more consistently on improvement  Diabetic Labs Latest Ref Rng & Units 03/19/2017 10/16/2016 03/27/2016 12/10/2015 07/08/2015  HbA1c <5.7 % 6.2(H) 6.2(H) 6.3(H) 6.2(H) CANCELED  Chol <200 mg/dL 227(H) 225(H) 221(H) 257(H) 220(H)  HDL >50 mg/dL 50(L) 56 49 54 52  Calc LDL <100 mg/dL 144(H) 146(H) 143(H) 178(H) 148(H)  Triglycerides <150 mg/dL 164(H) 113 147 123 101  Creatinine 0.50 - 0.99 mg/dL - 0.82 0.78 - 0.80   BP/Weight 03/21/2017 11/01/2016 10/18/2016 08/25/2016 03/29/2016 12/29/2015 93/71/6967  Systolic BP 893 810 175 102 585 - 277  Diastolic BP 80 58 78 80 76 - 76  Wt. (Lbs) 193.12 185 188 190 192 185 193  BMI 34.21 32.77 33.3 33.66 34.02 32.25 34.2   No flowsheet data found.

## 2017-06-18 ENCOUNTER — Other Ambulatory Visit: Payer: Self-pay | Admitting: Family Medicine

## 2017-06-18 DIAGNOSIS — Z1231 Encounter for screening mammogram for malignant neoplasm of breast: Secondary | ICD-10-CM

## 2017-06-20 ENCOUNTER — Ambulatory Visit (HOSPITAL_COMMUNITY)
Admission: RE | Admit: 2017-06-20 | Discharge: 2017-06-20 | Disposition: A | Discharge status: Home / Self Care | Payer: Federal, State, Local not specified - PPO | Source: Ambulatory Visit | Attending: Family Medicine | Admitting: Family Medicine

## 2017-06-20 DIAGNOSIS — Z1231 Encounter for screening mammogram for malignant neoplasm of breast: Secondary | ICD-10-CM | POA: Diagnosis not present

## 2017-08-15 ENCOUNTER — Ambulatory Visit: Payer: Federal, State, Local not specified - PPO | Admitting: Family Medicine

## 2017-10-29 ENCOUNTER — Other Ambulatory Visit: Payer: Self-pay | Admitting: Family Medicine

## 2017-10-29 ENCOUNTER — Telehealth: Payer: Self-pay

## 2017-10-29 DIAGNOSIS — R7303 Prediabetes: Secondary | ICD-10-CM

## 2017-10-29 DIAGNOSIS — E8881 Metabolic syndrome: Secondary | ICD-10-CM

## 2017-10-29 DIAGNOSIS — E559 Vitamin D deficiency, unspecified: Secondary | ICD-10-CM | POA: Diagnosis not present

## 2017-10-29 DIAGNOSIS — E7849 Other hyperlipidemia: Secondary | ICD-10-CM

## 2017-10-29 NOTE — Telephone Encounter (Signed)
Labs ordered.

## 2017-10-30 LAB — LIPID PANEL
Cholesterol: 241 mg/dL — ABNORMAL HIGH (ref <200)
HDL: 51 mg/dL (ref >50)
LDL CHOLESTEROL (CALC): 152 mg/dL — AB
Non-HDL Cholesterol (Calc): 190 mg/dL (calc) — ABNORMAL HIGH (ref <130)
TRIGLYCERIDES: 212 mg/dL — AB (ref <150)
Total CHOL/HDL Ratio: 4.7 (calc) (ref <5.0)

## 2017-10-30 LAB — COMPLETE METABOLIC PANEL WITH GFR
AG RATIO: 1.3 (calc) (ref 1.0–2.5)
ALBUMIN MSPROF: 3.9 g/dL (ref 3.6–5.1)
ALKALINE PHOSPHATASE (APISO): 118 U/L (ref 33–130)
ALT: 18 U/L (ref 6–29)
AST: 18 U/L (ref 10–35)
BILIRUBIN TOTAL: 0.7 mg/dL (ref 0.2–1.2)
BUN: 13 mg/dL (ref 7–25)
CHLORIDE: 103 mmol/L (ref 98–110)
CO2: 29 mmol/L (ref 20–32)
Calcium: 9.4 mg/dL (ref 8.6–10.4)
Creat: 0.87 mg/dL (ref 0.50–0.99)
GFR, EST AFRICAN AMERICAN: 81 mL/min/{1.73_m2} (ref >=60)
GFR, Est Non African American: 70 mL/min/{1.73_m2} (ref >=60)
Globulin: 3 g/dL (calc) (ref 1.9–3.7)
Glucose, Bld: 102 mg/dL — ABNORMAL HIGH (ref 65–99)
POTASSIUM: 4.1 mmol/L (ref 3.5–5.3)
SODIUM: 138 mmol/L (ref 135–146)
Total Protein: 6.9 g/dL (ref 6.1–8.1)

## 2017-10-30 LAB — VITAMIN D 25 HYDROXY (VIT D DEFICIENCY, FRACTURES): Vit D, 25-Hydroxy: 14 ng/mL — ABNORMAL LOW (ref 30–100)

## 2017-10-30 LAB — HEMOGLOBIN A1C
HEMOGLOBIN A1C: 6.4 %{Hb} — AB (ref <5.7)
MEAN PLASMA GLUCOSE: 137 (calc)
eAG (mmol/L): 7.6 (calc)

## 2017-10-30 LAB — TSH: TSH: 1.06 mIU/L (ref 0.40–4.50)

## 2017-11-01 ENCOUNTER — Ambulatory Visit (INDEPENDENT_AMBULATORY_CARE_PROVIDER_SITE_OTHER): Payer: PPO | Admitting: Family Medicine

## 2017-11-01 ENCOUNTER — Encounter: Payer: Self-pay | Admitting: Family Medicine

## 2017-11-01 ENCOUNTER — Other Ambulatory Visit: Payer: Self-pay

## 2017-11-01 VITALS — BP 134/80 | HR 79 | Ht 63.0 in | Wt 193.0 lb

## 2017-11-01 DIAGNOSIS — Z0000 Encounter for general adult medical examination without abnormal findings: Secondary | ICD-10-CM | POA: Diagnosis not present

## 2017-11-01 DIAGNOSIS — Z23 Encounter for immunization: Secondary | ICD-10-CM | POA: Diagnosis not present

## 2017-11-01 DIAGNOSIS — M5442 Lumbago with sciatica, left side: Secondary | ICD-10-CM | POA: Diagnosis not present

## 2017-11-01 DIAGNOSIS — E7849 Other hyperlipidemia: Secondary | ICD-10-CM | POA: Diagnosis not present

## 2017-11-01 DIAGNOSIS — R7303 Prediabetes: Secondary | ICD-10-CM

## 2017-11-01 MED ORDER — BETAMETHASONE DIPROPIONATE 0.05 % EX CREA: TOPICAL_CREAM | CUTANEOUS | 0 refills | Status: DC

## 2017-11-01 MED ORDER — RANITIDINE HCL 300 MG PO TABS: 300.0000 mg | ORAL_TABLET | Freq: Every day | ORAL | 1 refills | Status: DC

## 2017-11-01 MED ORDER — METHYLPREDNISOLONE ACETATE 80 MG/ML IJ SUSP
80.0000 mg | Freq: Once | INTRAMUSCULAR | Status: AC
  Administered 2017-11-01: 80 mg via INTRAMUSCULAR

## 2017-11-01 MED ORDER — IBUPROFEN 800 MG PO TABS: 800.0000 mg | ORAL_TABLET | Freq: Three times a day (TID) | ORAL | 1 refills | Status: DC | PRN

## 2017-11-01 MED ORDER — KETOROLAC TROMETHAMINE 60 MG/2ML IM SOLN
60.0000 mg | Freq: Once | INTRAMUSCULAR | Status: AC
  Administered 2017-11-01: 60 mg via INTRAMUSCULAR

## 2017-11-01 MED ORDER — PREDNISONE 5 MG (21) PO TBPK: 5.0000 mg | ORAL_TABLET | ORAL | 0 refills | Status: DC

## 2017-11-01 NOTE — Progress Notes (Signed)
Preventive Screening-Counseling & Management   Patient present here today for a welcome to medicare visit  2 day h/o acute LBP to left buttock posteriorly to left calf, no aggravating factor noted, denies weakness or  numbness of lower extremities, denies incontinence of stool or urine    Current Problems (verified)   Medications Prior to Visit Allergies (verified)   PAST HISTORY  Family History (updated)   Social History  Retired, 2 children, divorced, Worked previously for the [place] [employer], never smoker    Risk Factors  Current exercise habits:  Walks 3 miles a day but currently down in her back so she isn't able to at this time   Dietary issues discussed: low fat, low carb diet encouraged. Heart healthy diet    Cardiac risk factors: prediabetes, hyperlipidemia, father had CABG at age 69  Depression Screen  (Note: if answer to either of the following is "Yes", a more complete depression screening is indicated)   Over the past two weeks, have you felt down, depressed or hopeless? No  Over the past two weeks, have you felt little interest or pleasure in doing things? No  Have you lost interest or pleasure in daily life? No  Do you often feel hopeless? No  Do you cry easily over simple problems? No   Activities of Daily Living  In your present state of health, do you have any difficulty performing the following activities?  Driving?: No Managing money?: No Feeding yourself?:No Getting from bed to chair?:No Climbing a flight of stairs?: normally able to but down in her back currently and would have trouble Preparing food and eating?:No Bathing or showering?:No Getting dressed?:No Getting to the toilet?:No Using the toilet?:No Moving around from place to place?: No  Fall Risk Assessment In the past year have you fallen or had a near fall?:No Are you currently taking any medications that make you dizzy?:No   Hearing Difficulties: No Do you often ask people to  speak up or repeat themselves?:No Do you experience ringing or noises in your ears?:No Do you have difficulty understanding soft or whispered voices?:No  Cognitive Testing  Alert? Yes Normal Appearance?Yes  Oriented to person? Yes Place? Yes  Time? Yes  Displays appropriate judgment?Yes  Can read the correct time from a watch face? yes Are you having problems remembering things?No concerns   Advanced Directives have been discussed with the patient? Yes, does not think she actually has advanced directives, information provided about this List the Names of Other Physician/Practitioners you currently use:  None    Indicate any recent Medical Services you may have received from other than [facility] providers in the past year (date may be approximate).     Medicare Attestation  I have personally reviewed:  The patient's medical and social history  Their use of alcohol, tobacco or illicit drugs  Their current medications and supplements  The patient's functional ability including ADLs,fall risks, home safety risks, cognitive, and hearing and visual impairment  Diet and physical activities  Evidence for depression or mood disorders  The patient's weight, height, BMI, and visual acuity have been recorded in the chart. I have made referrals, counseling, and provided education to the patient based on review of the above and I have provided the patient with a written personalized care plan for preventive services.    Physical Exam BP 134/80   Pulse 79   Ht 5\' 3"  (1.6 m)   Wt 193 lb (87.5 kg)   BMI 34.19 kg/m  Pt in pain, and unable to fully weight bear on left lower extremity  MS: Decreased ROM lumbar spine, localized tenderness over lumbar spine , around L4 /5 , and also on left SI joint, paint follows dermatomal distribution posterior thigh to calf  CNS: normal power , tone and sensation in both lower extremities  EKG : Normal sinus rhythm, no ischemia , no LVH  CCUA:  normal  Assessment & Plan:  Welcome to Medicare preventive visit Welcome to Medicare visit is as documented. Counseling done  re healthy lifestyle involving commitment to 150 minutes exercise per week, heart healthy diet, and attaining healthy weight.The importance of adequate sleep also discussed. Regular seat belt use and home safety, is also discussed. Changes in health habits are decided on by the patient with goals and time frames  set for achieving them. Immunization and cancer screening needs are specifically addressed at this visit. Pt at increased CV risk due to prediabetic and hyperlipidemic status , obesity and up to recently, her very sedentary lifestyle.father also had CAD EKG in office : entirely normal CCUA is normal   Lumbar back pain with radiculopathy affecting left lower extremity Uncontrolled.Toradol and depo medrol administered IM in the office , to be followed by a short course of oral prednisone and NSAIDS.

## 2017-11-01 NOTE — Patient Instructions (Addendum)
Annual physical exam in 5 months, call if you need me sooner  Flu vaccine and Prevnar today  Toradol and depo medrol today for acute low back pain and ibuprofen andprednisone and zantac sent for symptom relief also  Please reduce fat and carbs in diet to improve health .  Glad you now enjoy walking  Living will info  to be provided, need that  Fasting lipid, HBA1C in 5 months  Thank you  for choosing [facility]. We consider it a privelige to serve you.  Delivering excellent health care in a caring and  compassionate way is our goal.  Partnering with you,  so that together we can achieve this goal is our strategy.   Please work on good  health habits so that your health will improve. 1. Commitment to daily physical activity for 30 to 60  minutes, if you are able to do this.  2. Commitment to wise food choices. Aim for half of your  food intake to be vegetable and fruit, one quarter starchy foods, and one quarter protein. Try to eat on a regular schedule  3 meals per day, snacking between meals should be limited to vegetables or fruits or small portions of nuts. 64 ounces of water per day is generally recommended, unless you have specific health conditions, like heart failure or kidney failure where you will need to limit fluid intake.  3. Commitment to sufficient and a  good quality of physical and mental rest daily, generally between 6 to 8 hours per day.  WITH PERSISTANCE AND PERSEVERANCE, THE IMPOSSIBLE , BECOMES THE NORM! It is important that you exercise regularly at least 30 minutes 5 times a week. If you develop chest pain, have severe difficulty breathing, or feel very tired, stop exercising immediately and seek medical attention

## 2017-11-04 DIAGNOSIS — Z0000 Encounter for general adult medical examination without abnormal findings: Secondary | ICD-10-CM | POA: Insufficient documentation

## 2017-11-04 NOTE — Assessment & Plan Note (Signed)
Uncontrolled.Toradol and depo medrol administered IM in the office , to be followed by a short course of oral prednisone and NSAIDS.  

## 2017-11-04 NOTE — Assessment & Plan Note (Addendum)
Welcome to Medicare visit is as documented. Counseling done  re healthy lifestyle involving commitment to 150 minutes exercise per week, heart healthy diet, and attaining healthy weight.The importance of adequate sleep also discussed. Regular seat belt use and home safety, is also discussed. Changes in health habits are decided on by the patient with goals and time frames  set for achieving them. Immunization and cancer screening needs are specifically addressed at this visit. Pt at increased CV risk due to prediabetic and hyperlipidemic status , obesity and up to recently, her very sedentary lifestyle.father also had CAD EKG in office : entirely normal CCUA is normal

## 2017-11-05 DIAGNOSIS — Z0000 Encounter for general adult medical examination without abnormal findings: Secondary | ICD-10-CM | POA: Diagnosis not present

## 2017-11-05 DIAGNOSIS — M5442 Lumbago with sciatica, left side: Secondary | ICD-10-CM | POA: Diagnosis not present

## 2017-11-05 DIAGNOSIS — E7849 Other hyperlipidemia: Secondary | ICD-10-CM | POA: Diagnosis not present

## 2017-11-05 DIAGNOSIS — Z23 Encounter for immunization: Secondary | ICD-10-CM | POA: Diagnosis not present

## 2017-11-07 DIAGNOSIS — M5136 Other intervertebral disc degeneration, lumbar region: Secondary | ICD-10-CM | POA: Diagnosis not present

## 2017-11-07 DIAGNOSIS — M5416 Radiculopathy, lumbar region: Secondary | ICD-10-CM | POA: Diagnosis not present

## 2017-11-13 ENCOUNTER — Telehealth: Payer: Self-pay | Admitting: Family Medicine

## 2017-11-13 ENCOUNTER — Ambulatory Visit (INDEPENDENT_AMBULATORY_CARE_PROVIDER_SITE_OTHER): Payer: PPO

## 2017-11-13 DIAGNOSIS — N3090 Cystitis, unspecified without hematuria: Secondary | ICD-10-CM

## 2017-11-13 LAB — POCT URINALYSIS DIPSTICK
BILIRUBIN UA: NEGATIVE
GLUCOSE UA: NEGATIVE
KETONES UA: NEGATIVE
Nitrite, UA: POSITIVE
Protein, UA: 100
SPEC GRAV UA: 1.01 (ref 1.010–1.025)
UROBILINOGEN UA: 0.2 U/dL
pH, UA: 5.5 (ref 5.0–8.0)

## 2017-11-13 MED ORDER — CIPROFLOXACIN HCL 500 MG PO TABS: 500.0000 mg | ORAL_TABLET | Freq: Two times a day (BID) | ORAL | 0 refills | Status: DC

## 2017-11-13 NOTE — Telephone Encounter (Signed)
Noted  

## 2017-11-13 NOTE — Progress Notes (Signed)
Urine checked and sent for culture. Cipro 500 1 bid sent per dr for 3 days

## 2017-11-13 NOTE — Telephone Encounter (Signed)
Patient says she came in a few weeks ago and received prednisone for her hip, the orthopedic doctor gave her pain medication, she says now she has a UTI. She is going to come in today for a nurse visit.

## 2017-11-14 DIAGNOSIS — M5416 Radiculopathy, lumbar region: Secondary | ICD-10-CM | POA: Diagnosis not present

## 2017-11-16 LAB — URINE CULTURE
MICRO NUMBER: 81308979
SPECIMEN QUALITY: ADEQUATE

## 2017-11-20 DIAGNOSIS — M5136 Other intervertebral disc degeneration, lumbar region: Secondary | ICD-10-CM | POA: Diagnosis not present

## 2017-11-20 DIAGNOSIS — M5416 Radiculopathy, lumbar region: Secondary | ICD-10-CM | POA: Diagnosis not present

## 2017-11-23 DIAGNOSIS — M5136 Other intervertebral disc degeneration, lumbar region: Secondary | ICD-10-CM | POA: Diagnosis not present

## 2017-11-23 DIAGNOSIS — M5126 Other intervertebral disc displacement, lumbar region: Secondary | ICD-10-CM | POA: Diagnosis not present

## 2017-11-23 DIAGNOSIS — M5432 Sciatica, left side: Secondary | ICD-10-CM | POA: Diagnosis not present

## 2017-11-23 DIAGNOSIS — M5416 Radiculopathy, lumbar region: Secondary | ICD-10-CM | POA: Diagnosis not present

## 2017-12-11 DIAGNOSIS — M5416 Radiculopathy, lumbar region: Secondary | ICD-10-CM | POA: Diagnosis not present

## 2017-12-11 DIAGNOSIS — M5136 Other intervertebral disc degeneration, lumbar region: Secondary | ICD-10-CM | POA: Diagnosis not present

## 2017-12-28 DIAGNOSIS — M5126 Other intervertebral disc displacement, lumbar region: Secondary | ICD-10-CM | POA: Diagnosis not present

## 2017-12-28 DIAGNOSIS — M48061 Spinal stenosis, lumbar region without neurogenic claudication: Secondary | ICD-10-CM | POA: Diagnosis not present

## 2018-01-02 ENCOUNTER — Ambulatory Visit (INDEPENDENT_AMBULATORY_CARE_PROVIDER_SITE_OTHER): Payer: PPO

## 2018-01-02 DIAGNOSIS — N3001 Acute cystitis with hematuria: Secondary | ICD-10-CM | POA: Diagnosis not present

## 2018-01-02 LAB — POCT URINALYSIS DIPSTICK
BILIRUBIN: NEGATIVE
GLUCOSE UA: NEGATIVE
Nitrite, UA: POSITIVE
Protein, UA: NEGATIVE
SPEC GRAV UA: 1.025 (ref 1.010–1.025)
UROBILINOGEN UA: 0.2 U/dL
pH, UA: 5.5 (ref 5.0–8.0)

## 2018-01-02 MED ORDER — CIPROFLOXACIN HCL 500 MG PO TABS: 500.0000 mg | ORAL_TABLET | Freq: Two times a day (BID) | ORAL | 0 refills | Status: DC

## 2018-01-02 NOTE — Progress Notes (Signed)
Positive for nitrates, trace of blood and trace leukocytes. Cipro sent in per MD standing order. Patient aware

## 2018-01-04 LAB — URINE CULTURE
MICRO NUMBER:: 90036504
SPECIMEN QUALITY: ADEQUATE

## 2018-01-25 ENCOUNTER — Telehealth: Payer: Self-pay | Admitting: Family Medicine

## 2018-01-25 ENCOUNTER — Other Ambulatory Visit: Payer: Self-pay | Admitting: Family Medicine

## 2018-01-25 DIAGNOSIS — N3000 Acute cystitis without hematuria: Secondary | ICD-10-CM | POA: Diagnosis not present

## 2018-01-25 DIAGNOSIS — N390 Urinary tract infection, site not specified: Secondary | ICD-10-CM

## 2018-01-25 NOTE — Progress Notes (Signed)
Referred to urology for recurrent uTI

## 2018-01-25 NOTE — Telephone Encounter (Signed)
Patient called in concerned because she has another UTI. This is 3 since November. She is worried because is keeps happening and its not clearing up. Says her urine is cloudy & has an odor and its embarrassing. Please advise. Cb#: [phone]

## 2018-01-25 NOTE — Telephone Encounter (Signed)
Want to bring in for more testing or urology referral?

## 2018-01-25 NOTE — Telephone Encounter (Signed)
Called and left message with info and order sent in for c/s

## 2018-01-25 NOTE — Telephone Encounter (Signed)
Needs urine sent for c/s and also needs to be referred to urology, she may have structuralb problem  causing recurrent UTI and the urologist would be able to determine that. I am entering a referral to [facility] in [place] where she lives I will treat with an antibiotic once the c/s is available next week. Pls ensure she has no fever , chills or flank pain

## 2018-01-28 ENCOUNTER — Other Ambulatory Visit: Payer: Self-pay | Admitting: Family Medicine

## 2018-01-28 ENCOUNTER — Encounter: Payer: Self-pay | Admitting: Family Medicine

## 2018-01-28 LAB — URINE CULTURE
MICRO NUMBER:: 90140892
SPECIMEN QUALITY:: ADEQUATE

## 2018-01-28 MED ORDER — CIPROFLOXACIN HCL 500 MG PO TABS: 500.0000 mg | ORAL_TABLET | Freq: Two times a day (BID) | ORAL | 0 refills | Status: DC

## 2018-03-20 ENCOUNTER — Other Ambulatory Visit: Payer: Self-pay | Admitting: Family Medicine

## 2018-04-01 ENCOUNTER — Encounter: Payer: Federal, State, Local not specified - PPO | Admitting: Family Medicine

## 2018-04-02 ENCOUNTER — Ambulatory Visit (INDEPENDENT_AMBULATORY_CARE_PROVIDER_SITE_OTHER): Payer: PPO | Admitting: Family Medicine

## 2018-04-02 ENCOUNTER — Encounter: Payer: Self-pay | Admitting: Family Medicine

## 2018-04-02 ENCOUNTER — Other Ambulatory Visit: Payer: Self-pay

## 2018-04-02 VITALS — BP 124/80 | HR 86 | Temp 99.1°F | Resp 12 | Ht 63.0 in | Wt 191.1 lb

## 2018-04-02 DIAGNOSIS — R05 Cough: Secondary | ICD-10-CM

## 2018-04-02 DIAGNOSIS — R059 Cough, unspecified: Secondary | ICD-10-CM

## 2018-04-02 MED ORDER — AZITHROMYCIN 250 MG PO TABS: ORAL_TABLET | ORAL | 0 refills | Status: DC

## 2018-04-02 MED ORDER — HYDROCOD POLST-CPM POLST ER 10-8 MG/5ML PO SUER: 5.0000 mL | Freq: Two times a day (BID) | ORAL | Status: DC

## 2018-04-02 MED ORDER — HYDROCOD POLST-CPM POLST ER 10-8 MG/5ML PO SUER: 5.0000 mL | Freq: Two times a day (BID) | ORAL | 0 refills | Status: DC | PRN

## 2018-04-02 NOTE — Patient Instructions (Addendum)
Push fluids  Use the cough medicine as needed Take the z pak as instruction  Continue flonase  Take ibuprofen if needed chest/rib pain  Call if not better in a few days

## 2018-04-02 NOTE — Progress Notes (Signed)
Chief Complaint  Patient presents with  . Cough    since Friday   Usual PCP is Tula Nakayama MD She is here today for an acute visit She states that she has been having symptoms for about a week.  She states that she has some underlying allergies and that is what she thought this was not first.  Runny stuffy nose, postnasal drip, sneezing and some early cough.  Most of her symptoms are improving except for the cough.  She states the cough is continuous and harsh.  She is coughed so hard that she has chest wall pain.  Now it hurts to take a deep breath.  She is feeling very tired.  She has had some sweats and chills.  She has not been around anybody with influenza or pneumonia.  She is up-to-date with immunizations for influenza and pneumonia.  She is coughing up some white and yellow sputum.  She feels like most of the sputum is stuck in her chest and she cannot cough it up.  No wheezing.  No history of underlying asthma or COPD.  Patient Active Problem List   Diagnosis Date Noted  . Welcome to Medicare preventive visit 11/04/2017  . Dermatitis 08/25/2016  . Vitamin D deficiency 07/12/2015  . Lack of physical exercise 11/01/2014  . Metabolic syndrome X 25/04/3975  . Cholelithiasis 01/22/2013  . Nodule of right lung 01/22/2013  . Lumbar back pain with radiculopathy affecting left lower extremity 01/16/2013  . Tubular adenoma 09/07/2011  . DEGENERATIVE JOINT DISEASE, KNEES, BILATERAL 01/06/2011  . Prediabetes 01/06/2011  . ALLERGIC RHINITIS, SEASONAL 09/02/2008  . Hyperlipidemia 05/19/2008  . Obesity (BMI 30.0-34.9) 05/19/2008  . GERD 05/19/2008    Outpatient Encounter Medications as of 04/02/2018  Medication Sig  . betamethasone dipropionate (DIPROLENE) 0.05 % cream Apply twice daily to affected area  For 10 days, then as  needed,  . fluticasone (FLONASE) 50 MCG/ACT nasal spray PLACE 2 SPRAYS INTO BOTH NOSTRILS DAILY AS NEEDED. FOR ALLERGIES  . HYDROcodone-acetaminophen (NORCO)  10-325 MG tablet Take 1 tablet by mouth 3 (three) times daily as needed for pain.  Marland Kitchen ibuprofen (ADVIL,MOTRIN) 800 MG tablet Take 1 tablet (800 mg total) every 8 (eight) hours as needed by mouth.  . ranitidine (ZANTAC) 300 MG tablet Take 1 tablet (300 mg total) at bedtime by mouth.  Marland Kitchen azithromycin (ZITHROMAX) 250 MG tablet tad  . chlorpheniramine-HYDROcodone (TUSSIONEX PENNKINETIC ER) 10-8 MG/5ML SUER Take 5 mLs by mouth every 12 (twelve) hours as needed for cough.   No facility-administered encounter medications on file as of 04/02/2018.     Allergies  Allergen Reactions  . Metformin And Related Nausea Only    Pt has cholelethiasis  . Tessalon Perles [Benzonatate] Dermatitis    rash    Review of Systems  Constitutional: Positive for chills and fatigue. Negative for activity change, appetite change and unexpected weight change.  HENT: Positive for postnasal drip and rhinorrhea. Negative for congestion, dental problem and sore throat.   Eyes: Negative for redness and visual disturbance.  Respiratory: Positive for cough. Negative for shortness of breath.   Cardiovascular: Positive for chest pain. Negative for palpitations and leg swelling.       Chest wall pain  Gastrointestinal: Negative for abdominal pain, constipation and diarrhea.  Genitourinary: Negative for difficulty urinating and frequency.  Musculoskeletal: Negative for arthralgias and back pain.  Neurological: Negative for dizziness and headaches.  Psychiatric/Behavioral: Negative for dysphoric mood and sleep disturbance. The patient is not  nervous/anxious.     Physical Exam  Constitutional: She appears well-developed and well-nourished. She appears distressed.  Appears tired and moderately ill  HENT:  Head: Normocephalic and atraumatic.  Right Ear: External ear normal.  Left Ear: External ear normal.  Mouth/Throat: Oropharynx is clear and moist.  Nasal membranes erythematous and swollen shut.  No sinus tenderness.   Posterior pharynx benign.  TMs are both clear.  Eyes: Pupils are equal, round, and reactive to light. Conjunctivae are normal.  Neck: Normal range of motion.  Cardiovascular: Normal rate and regular rhythm.  Murmur heard. Pulmonary/Chest: Effort normal.  No wheeze or rale.  Coarse rhonchi throughout  Abdominal: Soft. Bowel sounds are normal.  Musculoskeletal: She exhibits no edema.  Lymphadenopathy:    She has no cervical adenopathy.  Skin: Skin is warm and dry.  Psychiatric: She has a normal mood and affect. Her behavior is normal.    BP 124/80   Pulse 86   Temp 99.1 F (37.3 C) (Oral)   Resp 12   Ht 5\' 3"  (1.6 m)   Wt 191 lb 1.3 oz (86.7 kg)   SpO2 96%   BMI 33.85 kg/m     ASSESSMENT/PLAN:  1. Cough in adult Uncontrollable cough causing chest wall pain. Although most coughs and adults are caused by a virus, I am going to cover her with an antibiotic because she looks ill and is coughing uncontrollably.  I am also going to give her a stronger cough medicine for her comfort.  She has ibuprofen 800 mg at home and I think she should use this for her chest wall pain. Call or return if not better in 2-3 days  Patient Instructions  Push fluids  Use the cough medicine as needed Take the z pak as instruction  Continue flonase  Take ibuprofen if needed chest/rib pain  Call if not better in a few days   Raylene Everts, MD

## 2018-04-16 DIAGNOSIS — R7303 Prediabetes: Secondary | ICD-10-CM | POA: Diagnosis not present

## 2018-04-16 DIAGNOSIS — E8881 Metabolic syndrome: Secondary | ICD-10-CM | POA: Diagnosis not present

## 2018-04-16 DIAGNOSIS — E559 Vitamin D deficiency, unspecified: Secondary | ICD-10-CM | POA: Diagnosis not present

## 2018-04-16 LAB — LIPID PANEL
CHOLESTEROL: 237 mg/dL — AB (ref <200)
HDL: 44 mg/dL — AB (ref >50)
LDL Cholesterol (Calc): 159 mg/dL (calc) — ABNORMAL HIGH
Non-HDL Cholesterol (Calc): 193 mg/dL (calc) — ABNORMAL HIGH (ref <130)
Total CHOL/HDL Ratio: 5.4 (calc) — ABNORMAL HIGH (ref <5.0)
Triglycerides: 183 mg/dL — ABNORMAL HIGH (ref <150)

## 2018-04-16 LAB — COMPLETE METABOLIC PANEL WITH GFR
AG RATIO: 1.3 (calc) (ref 1.0–2.5)
ALKALINE PHOSPHATASE (APISO): 116 U/L (ref 33–130)
ALT: 15 U/L (ref 6–29)
AST: 16 U/L (ref 10–35)
Albumin: 4 g/dL (ref 3.6–5.1)
BILIRUBIN TOTAL: 0.6 mg/dL (ref 0.2–1.2)
BUN: 14 mg/dL (ref 7–25)
CHLORIDE: 106 mmol/L (ref 98–110)
CO2: 26 mmol/L (ref 20–32)
Calcium: 9.4 mg/dL (ref 8.6–10.4)
Creat: 0.79 mg/dL (ref 0.50–0.99)
GFR, Est African American: 91 mL/min/{1.73_m2} (ref >=60)
GFR, Est Non African American: 79 mL/min/{1.73_m2} (ref >=60)
GLOBULIN: 3 g/dL (ref 1.9–3.7)
Glucose, Bld: 139 mg/dL — ABNORMAL HIGH (ref 65–99)
POTASSIUM: 4 mmol/L (ref 3.5–5.3)
SODIUM: 140 mmol/L (ref 135–146)
Total Protein: 7 g/dL (ref 6.1–8.1)

## 2018-04-16 LAB — TSH: TSH: 2.12 mIU/L (ref 0.40–4.50)

## 2018-04-17 ENCOUNTER — Ambulatory Visit (INDEPENDENT_AMBULATORY_CARE_PROVIDER_SITE_OTHER): Payer: PPO | Admitting: Family Medicine

## 2018-04-17 ENCOUNTER — Ambulatory Visit (HOSPITAL_COMMUNITY)
Admission: RE | Admit: 2018-04-17 | Discharge: 2018-04-17 | Disposition: A | Discharge status: Home / Self Care | Payer: PPO | Source: Ambulatory Visit | Attending: Family Medicine | Admitting: Family Medicine

## 2018-04-17 ENCOUNTER — Encounter: Payer: Self-pay | Admitting: Family Medicine

## 2018-04-17 VITALS — BP 116/80 | HR 98 | Resp 16 | Ht 63.0 in | Wt 191.0 lb

## 2018-04-17 DIAGNOSIS — Z0000 Encounter for general adult medical examination without abnormal findings: Secondary | ICD-10-CM | POA: Diagnosis not present

## 2018-04-17 DIAGNOSIS — R05 Cough: Secondary | ICD-10-CM

## 2018-04-17 DIAGNOSIS — E1169 Type 2 diabetes mellitus with other specified complication: Secondary | ICD-10-CM

## 2018-04-17 DIAGNOSIS — Z1231 Encounter for screening mammogram for malignant neoplasm of breast: Secondary | ICD-10-CM

## 2018-04-17 DIAGNOSIS — R059 Cough, unspecified: Secondary | ICD-10-CM

## 2018-04-17 DIAGNOSIS — E669 Obesity, unspecified: Secondary | ICD-10-CM

## 2018-04-17 DIAGNOSIS — E782 Mixed hyperlipidemia: Secondary | ICD-10-CM

## 2018-04-17 LAB — VITAMIN D 25 HYDROXY (VIT D DEFICIENCY, FRACTURES): Vit D, 25-Hydroxy: 16 ng/mL — ABNORMAL LOW (ref 30–100)

## 2018-04-17 LAB — HEMOGLOBIN A1C
EAG (MMOL/L): 8.9 (calc)
Hgb A1c MFr Bld: 7.2 % of total Hgb — ABNORMAL HIGH (ref <5.7)
Mean Plasma Glucose: 160 (calc)

## 2018-04-17 MED ORDER — METHYLPREDNISOLONE ACETATE 80 MG/ML IJ SUSP
80.0000 mg | Freq: Once | INTRAMUSCULAR | Status: AC
  Administered 2018-04-17: 80 mg via INTRAMUSCULAR

## 2018-04-17 MED ORDER — METFORMIN HCL 500 MG PO TABS: 500.0000 mg | ORAL_TABLET | Freq: Every day | ORAL | 3 refills | Status: DC

## 2018-04-17 MED ORDER — ATORVASTATIN CALCIUM 20 MG PO TABS: 20.0000 mg | ORAL_TABLET | Freq: Every day | ORAL | 3 refills | Status: DC

## 2018-04-17 NOTE — Assessment & Plan Note (Addendum)
Stacey Smith is reminded of the importance of commitment to daily physical activity for 30 minutes or more, as able and the need to limit carbohydrate intake to 30 to 60 grams per meal to help with blood sugar control.   The need to take medication as prescribed, test blood sugar as directed, and to call between visits if there is a concern that blood sugar is uncontrolled is also discussed.   Stacey Smith is reminded of the importance of daily foot exam, annual eye examination, and good blood sugar, blood pressure and cholesterol control. She is referred to diabetic educator and will start metformin 500 mg one daily She is to test once daily  Diabetic Labs Latest Ref Rng & Units 04/16/2018 10/29/2017 03/19/2017 10/16/2016 03/27/2016  HbA1c <5.7 % of total Hgb 7.2(H) 6.4(H) 6.2(H) 6.2(H) 6.3(H)  Chol <200 mg/dL 237(H) 241(H) 227(H) 225(H) 221(H)  HDL >50 mg/dL 44(L) 51 50(L) 56 49  Calc LDL mg/dL (calc) 159(H) 152(H) 144(H) 146(H) 143(H)  Triglycerides <150 mg/dL 183(H) 212(H) 164(H) 113 147  Creatinine 0.50 - 0.99 mg/dL 0.79 0.87 - 0.82 0.78   BP/Weight 04/17/2018 04/02/2018 11/01/2017 03/21/2017 11/01/2016 74/94/4967 05/02/1637  Systolic BP 466 599 357 017 793 903 009  Diastolic BP 80 80 80 80 58 78 80  Wt. (Lbs) 191 191.08 193 193.12 185 188 190  BMI 33.83 33.85 34.19 34.21 32.77 33.3 33.66   No flowsheet data found.

## 2018-04-17 NOTE — Assessment & Plan Note (Addendum)
Depo medrol 80 mg iM in office and cXR, has 3 week h/o disturbing cough, likely allergy based

## 2018-04-17 NOTE — Patient Instructions (Addendum)
F/u in 3.5 months, call if you need me sooner  Please schedule mammogram due June 28 or after at [place] are referred for diabetic education very important you go  cXR at hospital today diabetic foot exam today, important you examine feet every day  Start once daily metformin and liptor at bedtime  Change diet as discussed  Fasting lipid, cmp and eGFR and hBA1c and microalb 5 days before follow up  It is important that you exercise regularly at least 30 minutes 5 times a week. If you develop chest pain, have severe difficulty breathing, or feel very tired, stop exercising immediately and seek medical attention  Thank you  for choosing [facility]. We consider it a privelige to serve you.  Delivering excellent health care in a caring and  compassionate way is our goal.  Partnering with you,  so that together we can achieve this goal is our strategy.

## 2018-04-18 ENCOUNTER — Encounter: Payer: Self-pay | Admitting: Family Medicine

## 2018-04-20 ENCOUNTER — Other Ambulatory Visit: Payer: Self-pay | Admitting: Family Medicine

## 2018-04-21 NOTE — Progress Notes (Signed)
Stacey Smith     MRN: [id number]      DOB: 1951/03/16  HPI: Patient is in for annual physical exam. C/o persistent cough x 3 weeks disturbing her sleep  Recent labs,  are reviewed.Blood sugar has deteriorated, she is now an established diabetic and her cholesterol is markedly increased , so these issues are addressed Immunization is reviewed , and  Is up to date PE: BP 116/80   Pulse 98   Resp 16   Ht 5\' 3"  (1.6 m)   Wt 191 lb (86.6 kg)   SpO2 98%   BMI 33.83 kg/m   Pleasant  female, alert and oriented x 3, in no cardio-pulmonary distress. Afebrile. HEENT No facial trauma or asymetry. Sinuses non tender.  Extra occullar muscles intact, pupils equally reactive to light. External ears normal, tympanic membranes clear. Oropharynx moist, no exudate. Neck: supple, no adenopathy,JVD or thyromegaly.No bruits.  Chest: Clear to ascultation bilaterally.No crackles or wheezes. Non tender to palpation  Breast: No asymetry,no masses or lumps. No tenderness. No nipple discharge or inversion. No axillary or supraclavicular adenopathy  Cardiovascular system; Heart sounds normal,  S1 and  S2 ,no S3.  No murmur, or thrill. Apical beat not displaced Peripheral pulses normal.  Abdomen: Soft, non tender, no organomegaly or masses. No bruits. Bowel sounds normal. No guarding, tenderness or rebound.  Rectal:  Not examined, asymptomatic, pt to return 3 stool cards  GU:  Not examined, asymptomatic  Musculoskeletal exam: Full ROM of spine, hips , shoulders and knees. No deformity ,swelling or crepitus noted. No muscle wasting or atrophy.   Neurologic: Cranial nerves 2 to 12 intact. Power, tone ,sensation and reflexes normal throughout. No disturbance in gait. No tremor.  Skin: Intact, no ulceration, erythema , scaling or rash noted. Pigmentation normal throughout  Psych; Normal mood and affect. Judgement and concentration normal   Assessment & Plan:  Cough in  adult Depo medrol 80 mg iM in office and cXR, has 3 week h/o disturbing cough, likely allergy based  Type 2 diabetes mellitus with other specified complication [facility]) Ms. Carignan is reminded of the importance of commitment to daily physical activity for 30 minutes or more, as able and the need to limit carbohydrate intake to 30 to 60 grams per meal to help with blood sugar control.   The need to take medication as prescribed, test blood sugar as directed, and to call between visits if there is a concern that blood sugar is uncontrolled is also discussed.   Ms. Halvorson is reminded of the importance of daily foot exam, annual eye examination, and good blood sugar, blood pressure and cholesterol control. She is referred to diabetic educator and will start metformin 500 mg one daily She is to test once daily  Diabetic Labs Latest Ref Rng & Units 04/16/2018 10/29/2017 03/19/2017 10/16/2016 03/27/2016  HbA1c <5.7 % of total Hgb 7.2(H) 6.4(H) 6.2(H) 6.2(H) 6.3(H)  Chol <200 mg/dL 237(H) 241(H) 227(H) 225(H) 221(H)  HDL >50 mg/dL 44(L) 51 50(L) 56 49  Calc LDL mg/dL (calc) 159(H) 152(H) 144(H) 146(H) 143(H)  Triglycerides <150 mg/dL 183(H) 212(H) 164(H) 113 147  Creatinine 0.50 - 0.99 mg/dL 0.79 0.87 - 0.82 0.78   BP/Weight 04/17/2018 04/02/2018 11/01/2017 03/21/2017 11/01/2016 62/13/0865 07/02/4695  Systolic BP 295 284 132 440 102 725 366  Diastolic BP 80 80 80 80 58 78 80  Wt. (Lbs) 191 191.08 193 193.12 185 188 190  BMI 33.83 33.85 34.19 34.21 32.77 33.3 33.66  No flowsheet data found.       Annual physical exam Annual exam as documented. Counseling done  re healthy lifestyle involving commitment to 150 minutes exercise per week, heart healthy diet, and attaining healthy weight.The importance of adequate sleep also discussed. Regular seat belt use and home safety, is also discussed. Changes in health habits are decided on by the patient with goals and time frames  set for achieving them. Immunization  and cancer screening needs are specifically addressed at this visit.   Hyperlipidemia Hyperlipidemia:Low fat diet discussed and encouraged.   Lipid Panel  Lab Results  Component Value Date   CHOL 237 (H) 04/16/2018   HDL 44 (L) 04/16/2018   LDLCALC 159 (H) 04/16/2018   TRIG 183 (H) 04/16/2018   CHOLHDL 5.4 (H) 04/16/2018   Dietary modification needed, and to start statin, she is at increased CV risk, encouraged to start exercise program also

## 2018-04-21 NOTE — Assessment & Plan Note (Signed)

## 2018-04-21 NOTE — Assessment & Plan Note (Signed)
Hyperlipidemia:Low fat diet discussed and encouraged.   Lipid Panel  Lab Results  Component Value Date   CHOL 237 (H) 04/16/2018   HDL 44 (L) 04/16/2018   LDLCALC 159 (H) 04/16/2018   TRIG 183 (H) 04/16/2018   CHOLHDL 5.4 (H) 04/16/2018   Dietary modification needed, and to start statin, she is at increased CV risk, encouraged to start exercise program also

## 2018-05-07 ENCOUNTER — Other Ambulatory Visit: Payer: Self-pay | Admitting: Family Medicine

## 2018-05-16 ENCOUNTER — Telehealth: Payer: Self-pay

## 2018-05-16 NOTE — Telephone Encounter (Signed)
Error

## 2018-05-24 DIAGNOSIS — Z1211 Encounter for screening for malignant neoplasm of colon: Secondary | ICD-10-CM

## 2018-05-24 LAB — HEMOCCULT GUIAC POC 1CARD (OFFICE)
FECAL OCCULT BLD: NEGATIVE
FECAL OCCULT BLD: NEGATIVE
FECAL OCCULT BLD: NEGATIVE

## 2018-06-04 ENCOUNTER — Encounter: Payer: Self-pay | Admitting: Dietician

## 2018-06-04 ENCOUNTER — Encounter: Payer: PPO | Attending: Family Medicine | Admitting: Dietician

## 2018-06-04 DIAGNOSIS — Z713 Dietary counseling and surveillance: Secondary | ICD-10-CM | POA: Insufficient documentation

## 2018-06-04 DIAGNOSIS — E1169 Type 2 diabetes mellitus with other specified complication: Secondary | ICD-10-CM | POA: Insufficient documentation

## 2018-06-04 NOTE — Progress Notes (Signed)
Patient was seen on 06/04/18 for the first of a series of three diabetes self-management courses at [facility].  Patient Education Plan per assessed needs and concerns is to attend three course education program for Diabetes Self Management Education.  The following learning objectives were met by the patient during this class:  Describe diabetes  State some common risk factors for diabetes  Defines the role of glucose and insulin  Identifies type of diabetes and pathophysiology  Describe the relationship between diabetes and cardiovascular risk  State the members of the Healthcare Team  States the rationale for glucose monitoring  State when to test glucose  State their individual Target Range  State the importance of logging glucose readings  Describe how to interpret glucose readings  Identifies A1C target  Explain the correlation between A1c and eAG values  State symptoms and treatment of high blood glucose  State symptoms and treatment of low blood glucose  Explain proper technique for glucose testing  Identifies proper sharps disposal  Handouts given during class include:  ADA Diabetes You Take Control   Carb Counting and Meal Planning book  Meal Plan Card  Meal planning worksheet  Low Sodium Flavoring Tips  Types of Fats  The diabetes portion plate  O1R to eAG Conversion Chart  Diabetes Recommended Care Schedule  Support Group  Diabetes Success Plan  Core Class Satisfaction Survey   Follow-Up Plan:  Attend core 2

## 2018-06-11 ENCOUNTER — Encounter: Payer: PPO | Admitting: Dietician

## 2018-06-11 DIAGNOSIS — E1169 Type 2 diabetes mellitus with other specified complication: Secondary | ICD-10-CM

## 2018-06-11 NOTE — Progress Notes (Signed)
Patient was seen on 06/11/18 for the second of a series of three diabetes self-management courses at the [facility]. The following learning objectives were met by the patient during this class:   Describe the role of different macronutrients on glucose  Explain how carbohydrates affect blood glucose  State what foods contain the most carbohydrates  Demonstrate carbohydrate counting  Demonstrate how to read Nutrition Facts food label  Describe effects of various fats on heart health  Describe the importance of good nutrition for health and healthy eating strategies  Describe techniques for managing your shopping, cooking and meal planning  List strategies to follow meal plan when dining out  Describe the effects of alcohol on glucose and how to use it safely  Goals:  Follow Diabetes Meal Plan as instructed  Aim to spread carbs evenly throughout the day  Aim for 3 meals per day and snacks as needed Include lean protein foods to meals/snacks  Monitor glucose levels as instructed by your doctor   Follow-Up Plan:  Attend Core 3  Work towards following your personal food plan.

## 2018-06-18 ENCOUNTER — Encounter: Payer: Self-pay | Admitting: Dietician

## 2018-06-18 ENCOUNTER — Encounter: Payer: PPO | Admitting: Dietician

## 2018-06-18 DIAGNOSIS — E1169 Type 2 diabetes mellitus with other specified complication: Secondary | ICD-10-CM

## 2018-06-18 NOTE — Progress Notes (Signed)
Patient was seen on 06/18/18 for the third of a series of three diabetes self-management courses at the [facility].   Catalina [place] the amount of activity recommended for healthy living . Describe activities suitable for individual needs . Identify ways to regularly incorporate activity into daily life . Identify barriers to activity and ways to over come these barriers  Identify diabetes medications being personally used and their primary action for lowering glucose and possible side effects . Describe role of stress on blood glucose and develop strategies to address psychosocial issues . Identify diabetes complications and ways to prevent them  Explain how to manage diabetes during illness . Evaluate success in meeting personal goal . Establish 2-3 goals that they will plan to diligently work on  Goals:   I will count my carb choices at most meals and snacks  I will be active 30 minutes or more 3 times a week  I will take my diabetes medications as scheduled  I will eat less unhealthy fats by eating less bacon  Your patient has identified these potential barriers to change:  Motivation  Your patient has identified their diabetes self-care support plan as  Family Support   Plan:  Attend Support Group as desired

## 2018-06-26 ENCOUNTER — Ambulatory Visit (HOSPITAL_COMMUNITY)
Admission: RE | Admit: 2018-06-26 | Discharge: 2018-06-26 | Disposition: A | Discharge status: Home / Self Care | Payer: PPO | Source: Ambulatory Visit | Attending: Family Medicine | Admitting: Family Medicine

## 2018-06-26 ENCOUNTER — Encounter (HOSPITAL_COMMUNITY): Payer: Self-pay

## 2018-06-26 DIAGNOSIS — Z1231 Encounter for screening mammogram for malignant neoplasm of breast: Secondary | ICD-10-CM | POA: Diagnosis not present

## 2018-07-15 DIAGNOSIS — E669 Obesity, unspecified: Secondary | ICD-10-CM | POA: Diagnosis not present

## 2018-07-15 DIAGNOSIS — E1169 Type 2 diabetes mellitus with other specified complication: Secondary | ICD-10-CM | POA: Diagnosis not present

## 2018-07-15 DIAGNOSIS — E782 Mixed hyperlipidemia: Secondary | ICD-10-CM | POA: Diagnosis not present

## 2018-07-16 ENCOUNTER — Encounter: Payer: Self-pay | Admitting: Family Medicine

## 2018-07-16 LAB — HEMOGLOBIN A1C
Hgb A1c MFr Bld: 6.4 % of total Hgb — ABNORMAL HIGH (ref <5.7)
Mean Plasma Glucose: 137 (calc)
eAG (mmol/L): 7.6 (calc)

## 2018-07-16 LAB — COMPLETE METABOLIC PANEL WITH GFR
AG RATIO: 1.4 (calc) (ref 1.0–2.5)
ALBUMIN MSPROF: 4.4 g/dL (ref 3.6–5.1)
ALT: 19 U/L (ref 6–29)
AST: 18 U/L (ref 10–35)
Alkaline phosphatase (APISO): 141 U/L — ABNORMAL HIGH (ref 33–130)
BUN: 14 mg/dL (ref 7–25)
CALCIUM: 9.7 mg/dL (ref 8.6–10.4)
CHLORIDE: 102 mmol/L (ref 98–110)
CO2: 29 mmol/L (ref 20–32)
Creat: 0.83 mg/dL (ref 0.50–0.99)
GFR, EST AFRICAN AMERICAN: 86 mL/min/{1.73_m2} (ref >=60)
GFR, EST NON AFRICAN AMERICAN: 74 mL/min/{1.73_m2} (ref >=60)
GLOBULIN: 3.1 g/dL (ref 1.9–3.7)
Glucose, Bld: 110 mg/dL — ABNORMAL HIGH (ref 65–99)
Potassium: 4 mmol/L (ref 3.5–5.3)
SODIUM: 140 mmol/L (ref 135–146)
TOTAL PROTEIN: 7.5 g/dL (ref 6.1–8.1)
Total Bilirubin: 1.1 mg/dL (ref 0.2–1.2)

## 2018-07-16 LAB — MICROALBUMIN / CREATININE URINE RATIO
Creatinine, Urine: 139 mg/dL (ref 20–275)
MICROALB UR: 0.5 mg/dL
MICROALB/CREAT RATIO: 4 ug/mg{creat} (ref <30)

## 2018-07-16 LAB — LIPID PANEL
CHOLESTEROL: 145 mg/dL (ref <200)
HDL: 47 mg/dL — AB (ref >50)
LDL Cholesterol (Calc): 78 mg/dL (calc)
Non-HDL Cholesterol (Calc): 98 mg/dL (calc) (ref <130)
TRIGLYCERIDES: 122 mg/dL (ref <150)
Total CHOL/HDL Ratio: 3.1 (calc) (ref <5.0)

## 2018-07-17 ENCOUNTER — Encounter: Payer: Self-pay | Admitting: Family Medicine

## 2018-07-17 ENCOUNTER — Ambulatory Visit (INDEPENDENT_AMBULATORY_CARE_PROVIDER_SITE_OTHER): Payer: PPO | Admitting: Family Medicine

## 2018-07-17 ENCOUNTER — Other Ambulatory Visit: Payer: Self-pay

## 2018-07-17 VITALS — BP 110/78 | HR 82 | Resp 12 | Ht 63.0 in | Wt 184.0 lb

## 2018-07-17 DIAGNOSIS — Z723 Lack of physical exercise: Secondary | ICD-10-CM | POA: Diagnosis not present

## 2018-07-17 DIAGNOSIS — J3489 Other specified disorders of nose and nasal sinuses: Secondary | ICD-10-CM

## 2018-07-17 DIAGNOSIS — Z780 Asymptomatic menopausal state: Secondary | ICD-10-CM

## 2018-07-17 DIAGNOSIS — E7849 Other hyperlipidemia: Secondary | ICD-10-CM | POA: Diagnosis not present

## 2018-07-17 DIAGNOSIS — E1169 Type 2 diabetes mellitus with other specified complication: Secondary | ICD-10-CM | POA: Diagnosis not present

## 2018-07-17 DIAGNOSIS — E669 Obesity, unspecified: Secondary | ICD-10-CM

## 2018-07-17 DIAGNOSIS — E66811 Obesity, class 1: Secondary | ICD-10-CM

## 2018-07-17 MED ORDER — MUPIROCIN CALCIUM 2 % NA OINT: TOPICAL_OINTMENT | NASAL | 1 refills | Status: DC

## 2018-07-17 MED ORDER — IBUPROFEN 800 MG PO TABS: 800.0000 mg | ORAL_TABLET | Freq: Three times a day (TID) | ORAL | 1 refills | Status: DC | PRN

## 2018-07-17 NOTE — Assessment & Plan Note (Addendum)
Stacey Smith is reminded of the importance of commitment to daily physical activity for 30 minutes or more, as able and the need to limit carbohydrate intake to 30 to 60 grams per meal to help with blood sugar control.   The need to take medication as prescribed, test blood sugar as directed, and to call between visits if there is a concern that blood sugar is uncontrolled is also discussed.  Improved, continue medication  Stacey Smith is reminded of the importance of daily foot exam, annual eye examination, and good blood sugar, blood pressure and cholesterol control.  Diabetic Labs Latest Ref Rng & Units 07/15/2018 04/16/2018 10/29/2017 03/19/2017 10/16/2016  HbA1c <5.7 % of total Hgb 6.4(H) 7.2(H) 6.4(H) 6.2(H) 6.2(H)  Microalbumin mg/dL 0.5 - - - -  Micro/Creat Ratio <30 mcg/mg creat 4 - - - -  Chol <200 mg/dL 145 237(H) 241(H) 227(H) 225(H)  HDL >50 mg/dL 47(L) 44(L) 51 50(L) 56  Calc LDL mg/dL (calc) 78 159(H) 152(H) 144(H) 146(H)  Triglycerides <150 mg/dL 122 183(H) 212(H) 164(H) 113  Creatinine 0.50 - 0.99 mg/dL 0.83 0.79 0.87 - 0.82   BP/Weight 07/17/2018 06/04/2018 04/17/2018 04/02/2018 11/01/2017 03/21/2017 46/05/5992  Systolic BP 570 - 177 939 030 092 330  Diastolic BP 78 - 80 80 80 80 58  Wt. (Lbs) 184 187 191 191.08 193 193.12 185  BMI 32.59 33.13 33.83 33.85 34.19 34.21 32.77   Foot/eye exam completion dates 04/17/2018  Foot Form Completion Done

## 2018-07-17 NOTE — Patient Instructions (Signed)
Annual physical exam in 4 months with pap , call if you need me before  Please schedule  Her bone density in [place]    It is important that you exercise regularly at least 30 minutes 5 times a week. If you develop chest pain, have severe difficulty breathing, or feel very tired, stop exercising immediately and seek medical attention    CONGRATS keep up the great work  No med changes  Shingrix vaccines are due , check your pharmacy,also get diabetic eye exam

## 2018-07-20 ENCOUNTER — Encounter: Payer: Self-pay | Admitting: Family Medicine

## 2018-07-20 NOTE — Assessment & Plan Note (Signed)
Topical bactroban twice daily for 5 days then as needed

## 2018-07-20 NOTE — Progress Notes (Signed)
Stacey Smith     MRN: [id number]      DOB: 10/16/1951   HPI Stacey Smith is here for follow up and re-evaluation of chronic medical conditions, medication management and review of any available recent lab and radiology data.  Preventive health is updated, specifically  Cancer screening and Immunization.   Questions or concerns regarding consultations or procedures which the PT has had in the interim are  Addressed. Has found the diabetric ed class  Very useful and is pleased with her labs and weight loss. The PT denies any adverse reactions to current medications since the last visit.  There are no new concerns.  There are no specific complaints  Denies polyuria, polydipsia, blurred vision , or hypoglycemic episodes.   ROS Denies recent fever or chills. Denies sinus pressure, nasal congestion, ear pain or sore throat. Denies chest congestion, productive cough or wheezing. Denies chest pains, palpitations and leg swelling Denies abdominal pain, nausea, vomiting,diarrhea or constipation.   Denies dysuria, frequency, hesitancy or incontinence. Denies joint pain, swelling and limitation in mobility. Denies headaches, seizures, numbness, or tingling. Denies depression, anxiety or insomnia. C/o sore in right nostril   PE  BP 110/78 (BP Location: Right Arm, Patient Position: Sitting, Cuff Size: Normal)   Pulse 82   Ht 5\' 3"  (1.6 m)   Wt 184 lb (83.5 kg)   SpO2 98%   BMI 32.59 kg/m   Patient alert and oriented and in no cardiopulmonary distress.  HEENT: No facial asymmetry, EOMI,   oropharynx pink and moist.  Neck supple no JVD, no mass.  Chest: Clear to auscultation bilaterally.  CVS: S1, S2 no murmurs, no S3.Regular rate.  ABD: Soft non tender.   Ext: No edema  MS: Adequate ROM spine, shoulders, hips and knees.  Skin: Intact, no ulcerations or rash noted.  Psych: Good eye contact, normal affect. Memory intact not anxious or depressed appearing.  CNS: CN 2-12 intact,  power,  normal throughout.no focal deficits noted.   Assessment & Plan  Type 2 diabetes mellitus with other specified complication [facility]) Stacey Smith is reminded of the importance of commitment to daily physical activity for 30 minutes or more, as able and the need to limit carbohydrate intake to 30 to 60 grams per meal to help with blood sugar control.   The need to take medication as prescribed, test blood sugar as directed, and to call between visits if there is a concern that blood sugar is uncontrolled is also discussed.  Improved, continue medication  Stacey Smith is reminded of the importance of daily foot exam, annual eye examination, and good blood sugar, blood pressure and cholesterol control.  Diabetic Labs Latest Ref Rng & Units 07/15/2018 04/16/2018 10/29/2017 03/19/2017 10/16/2016  HbA1c <5.7 % of total Hgb 6.4(H) 7.2(H) 6.4(H) 6.2(H) 6.2(H)  Microalbumin mg/dL 0.5 - - - -  Micro/Creat Ratio <30 mcg/mg creat 4 - - - -  Chol <200 mg/dL 145 237(H) 241(H) 227(H) 225(H)  HDL >50 mg/dL 47(L) 44(L) 51 50(L) 56  Calc LDL mg/dL (calc) 78 159(H) 152(H) 144(H) 146(H)  Triglycerides <150 mg/dL 122 183(H) 212(H) 164(H) 113  Creatinine 0.50 - 0.99 mg/dL 0.83 0.79 0.87 - 0.82   BP/Weight 07/17/2018 06/04/2018 04/17/2018 04/02/2018 11/01/2017 03/21/2017 40/08/8118  Systolic BP 147 - 829 562 130 865 784  Diastolic BP 78 - 80 80 80 80 58  Wt. (Lbs) 184 187 191 191.08 193 193.12 185  BMI 32.59 33.13 33.83 33.85 34.19 34.21 32.77  Foot/eye exam completion dates 04/17/2018  Foot Form Completion Done        Hyperlipidemia Improved, con tine current medication  Hyperlipidemia:Low fat diet discussed and encouraged.   Lipid Panel  Lab Results  Component Value Date   CHOL 145 07/15/2018   HDL 47 (L) 07/15/2018   LDLCALC 78 07/15/2018   TRIG 122 07/15/2018   CHOLHDL 3.1 07/15/2018   Needs to increase exercise , currently dong twice weekly exercise   Obesity (BMI  30.0-34.9) .Improved. Patient re-educated about  the importance of commitment to a  minimum of 150 minutes of exercise per week.  The importance of healthy food choices with portion control discussed. Encouraged to start a food diary, count calories and to consider  joining a support group. Sample diet sheets offered. Goals set by the patient for the next several months.   Weight /BMI 07/17/2018 06/04/2018 04/17/2018  WEIGHT 184 lb 187 lb 191 lb  HEIGHT 5\' 3"  5\' 3"  5\' 3"   BMI 32.59 kg/m2 33.13 kg/m2 33.83 kg/m2      Lack of physical exercise Encouraged to increase exercise , currently doing twice weekly goal is at least 5 days / week  Nostril infection Topical bactroban twice daily for 5 days then as needed

## 2018-07-20 NOTE — Assessment & Plan Note (Signed)
Improved, con tine current medication  Hyperlipidemia:Low fat diet discussed and encouraged.   Lipid Panel  Lab Results  Component Value Date   CHOL 145 07/15/2018   HDL 47 (L) 07/15/2018   LDLCALC 78 07/15/2018   TRIG 122 07/15/2018   CHOLHDL 3.1 07/15/2018   Needs to increase exercise , currently dong twice weekly exercise

## 2018-07-20 NOTE — Assessment & Plan Note (Signed)
.  Improved. Patient re-educated about  the importance of commitment to a  minimum of 150 minutes of exercise per week.  The importance of healthy food choices with portion control discussed. Encouraged to start a food diary, count calories and to consider  joining a support group. Sample diet sheets offered. Goals set by the patient for the next several months.   Weight /BMI 07/17/2018 06/04/2018 04/17/2018  WEIGHT 184 lb 187 lb 191 lb  HEIGHT 5\' 3"  5\' 3"  5\' 3"   BMI 32.59 kg/m2 33.13 kg/m2 33.83 kg/m2

## 2018-07-20 NOTE — Assessment & Plan Note (Signed)
Encouraged to increase exercise , currently doing twice weekly goal is at least 5 days / week

## 2018-07-26 ENCOUNTER — Inpatient Hospital Stay (HOSPITAL_COMMUNITY): Admission: RE | Admit: 2018-07-26 | Payer: PPO | Source: Ambulatory Visit

## 2018-07-31 LAB — HM DIABETES EYE EXAM

## 2018-11-06 ENCOUNTER — Telehealth: Payer: Self-pay | Admitting: Family Medicine

## 2018-11-06 ENCOUNTER — Other Ambulatory Visit: Payer: Self-pay

## 2018-11-06 ENCOUNTER — Ambulatory Visit (INDEPENDENT_AMBULATORY_CARE_PROVIDER_SITE_OTHER): Payer: PPO

## 2018-11-06 DIAGNOSIS — Z23 Encounter for immunization: Secondary | ICD-10-CM

## 2018-11-06 MED ORDER — FLUTICASONE PROPIONATE 50 MCG/ACT NA SUSP: 2.0000 | Freq: Every day | NASAL | 5 refills | Status: DC | PRN

## 2018-11-06 NOTE — Telephone Encounter (Signed)
Please send Flonase in to [facility] on [address]

## 2018-11-06 NOTE — Telephone Encounter (Signed)
Done

## 2018-11-25 ENCOUNTER — Other Ambulatory Visit: Payer: Self-pay

## 2018-11-25 ENCOUNTER — Ambulatory Visit (INDEPENDENT_AMBULATORY_CARE_PROVIDER_SITE_OTHER): Payer: PPO | Admitting: Family Medicine

## 2018-11-25 ENCOUNTER — Encounter: Payer: Self-pay | Admitting: Family Medicine

## 2018-11-25 VITALS — BP 128/82 | HR 85 | Resp 12 | Ht 63.0 in | Wt 187.0 lb

## 2018-11-25 DIAGNOSIS — Z23 Encounter for immunization: Secondary | ICD-10-CM | POA: Diagnosis not present

## 2018-11-25 DIAGNOSIS — E1169 Type 2 diabetes mellitus with other specified complication: Secondary | ICD-10-CM | POA: Diagnosis not present

## 2018-11-25 DIAGNOSIS — Z9189 Other specified personal risk factors, not elsewhere classified: Secondary | ICD-10-CM

## 2018-11-25 DIAGNOSIS — R079 Chest pain, unspecified: Secondary | ICD-10-CM

## 2018-11-25 DIAGNOSIS — E7849 Other hyperlipidemia: Secondary | ICD-10-CM

## 2018-11-25 DIAGNOSIS — E559 Vitamin D deficiency, unspecified: Secondary | ICD-10-CM

## 2018-11-25 DIAGNOSIS — Z0000 Encounter for general adult medical examination without abnormal findings: Secondary | ICD-10-CM

## 2018-11-25 DIAGNOSIS — E8881 Metabolic syndrome: Secondary | ICD-10-CM

## 2018-11-25 NOTE — Patient Instructions (Addendum)
F/U in 4.5 months, call if you need me before  Return for pneumonia 23  EKG today , since not totally normal, you are being referred to Cardiology ' Labs CBC , non fast , cmp and EGFR , HBA1C and vit d today/ this week  Fasting lipid, cmp and eGFr and hBA1C in 1 week before follow up in 4.5 months  Start once daily oTC vitamin D3 2000 IU  It is important that you exercise regularly at least 30 minutes 5 times a week. If you develop chest pain, have severe difficulty breathing, or feel very tired, stop exercising immediately and seek medical attention   Please work on good  health habits so that your health will improve. 1. Commitment to daily physical activity for 30 to 60  minutes, if you are able to do this.  2. Commitment to wise food choices. Aim for half of your  food intake to be vegetable and fruit, one quarter starchy foods, and one quarter protein. Try to eat on a regular schedule  3 meals per day, snacking between meals should be limited to vegetables or fruits or small portions of nuts. 64 ounces of water per day is generally recommended, unless you have specific health conditions, like heart failure or kidney failure where you will need to limit fluid intake.  3. Commitment to sufficient and a  good quality of physical and mental rest daily, generally between 6 to 8 hours per day.  WITH PERSISTANCE AND PERSEVERANCE, THE IMPOSSIBLE , BECOMES THE NORM!

## 2018-11-26 ENCOUNTER — Encounter: Payer: Self-pay | Admitting: Family Medicine

## 2018-11-26 LAB — HEMOGLOBIN A1C
EAG (MMOL/L): 8.4 (calc)
Hgb A1c MFr Bld: 6.9 % of total Hgb — ABNORMAL HIGH (ref <5.7)
Mean Plasma Glucose: 151 (calc)

## 2018-11-26 LAB — CBC
HCT: 39.7 % (ref 35.0–45.0)
Hemoglobin: 12.1 g/dL (ref 11.7–15.5)
MCH: 22.6 pg — ABNORMAL LOW (ref 27.0–33.0)
MCHC: 30.5 g/dL — ABNORMAL LOW (ref 32.0–36.0)
MCV: 74.2 fL — ABNORMAL LOW (ref 80.0–100.0)
MPV: 11.2 fL (ref 7.5–12.5)
Platelets: 208 10*3/uL (ref 140–400)
RBC: 5.35 10*6/uL — ABNORMAL HIGH (ref 3.80–5.10)
RDW: 14.9 % (ref 11.0–15.0)
WBC: 10.7 10*3/uL (ref 3.8–10.8)

## 2018-11-26 LAB — COMPLETE METABOLIC PANEL WITH GFR
AG RATIO: 1.3 (calc) (ref 1.0–2.5)
ALKALINE PHOSPHATASE (APISO): 146 U/L — AB (ref 33–130)
ALT: 14 U/L (ref 6–29)
AST: 15 U/L (ref 10–35)
Albumin: 4.3 g/dL (ref 3.6–5.1)
BILIRUBIN TOTAL: 0.6 mg/dL (ref 0.2–1.2)
BUN: 18 mg/dL (ref 7–25)
CHLORIDE: 102 mmol/L (ref 98–110)
CO2: 28 mmol/L (ref 20–32)
Calcium: 9.8 mg/dL (ref 8.6–10.4)
Creat: 0.93 mg/dL (ref 0.50–0.99)
GFR, EST AFRICAN AMERICAN: 74 mL/min/{1.73_m2} (ref >=60)
GFR, Est Non African American: 64 mL/min/{1.73_m2} (ref >=60)
Globulin: 3.2 g/dL (calc) (ref 1.9–3.7)
Glucose, Bld: 81 mg/dL (ref 65–139)
POTASSIUM: 3.7 mmol/L (ref 3.5–5.3)
Sodium: 138 mmol/L (ref 135–146)
TOTAL PROTEIN: 7.5 g/dL (ref 6.1–8.1)

## 2018-11-26 LAB — VITAMIN D 25 HYDROXY (VIT D DEFICIENCY, FRACTURES): Vit D, 25-Hydroxy: 14 ng/mL — ABNORMAL LOW (ref 30–100)

## 2018-11-28 ENCOUNTER — Encounter: Payer: Self-pay | Admitting: Family Medicine

## 2018-12-01 ENCOUNTER — Encounter: Payer: Self-pay | Admitting: Family Medicine

## 2018-12-01 DIAGNOSIS — Z9189 Other specified personal risk factors, not elsewhere classified: Secondary | ICD-10-CM | POA: Insufficient documentation

## 2018-12-01 NOTE — Assessment & Plan Note (Signed)
After obtaining informed consent, the vaccine is  administered 

## 2018-12-01 NOTE — Assessment & Plan Note (Addendum)
Increased frequency of left sided chest pain noted in the past several months along with poor tolerance to activity. Generally not committed to exercise and has had even less physical activity in the past 4 to months as she her self states that she pushes herself even to get out the 3 days per week to collect her grand children EKG; no LVH, NSR, non specific t wave abnormalities noted , inverted t in anterior leads Pt is diabetic and has hyperlipidemia, refer to cardiology

## 2018-12-01 NOTE — Assessment & Plan Note (Signed)
Annual exam as documented. Counseling done  re healthy lifestyle involving commitment to 150 minutes exercise per week, heart healthy diet, and attaining healthy weight.The importance of adequate sleep also discussed. Changes in health habits are decided on by the patient with goals and time frames  set for achieving them. Immunization and cancer screening needs are specifically addressed at this visit. 

## 2018-12-01 NOTE — Assessment & Plan Note (Signed)
Type 2 diabetes, hyperlipidemia, metabolic syndrome, lack of physical activity, new non specific chest pain and abnormal EKG, refer to cardiology for further evaluation

## 2018-12-01 NOTE — Progress Notes (Signed)
    Stacey Smith     MRN: [id number]      DOB: Dec 27, 1950  HPI: Patient is in for annual physical exam. C/o increasing exertional fatigue with reduced exercise tolerance and intermittent left  Anterior chest pain , noted in the past several months, no specific aggravating or relieving factors, denies associated nausea, diaphoresis  Or light headedness Recent labs, are reviewed. Immunization is reviewed , and  updated  C/o fatigue and social withdrawal in the past 4 months , which she relates to grieving for her deceased partner    PE: BP 128/82 (BP Location: Left Arm, Patient Position: Sitting, Cuff Size: Large)   Pulse 85   Resp 12   Ht 5\' 3"  (1.6 m)   Wt 187 lb (84.8 kg)   SpO2 97% Comment: room air  BMI 33.13 kg/m   Pleasant  female, alert and oriented x 3, in no cardio-pulmonary distress. Afebrile. HEENT No facial trauma or asymetry. Sinuses non tender.  Extra occullar muscles intact, pupils equally reactive to light. External ears normal, tympanic membranes clear. Oropharynx moist, no exudate. Neck: supple, no adenopathy,JVD or thyromegaly.No bruits.  Chest: Clear to ascultation bilaterally.No crackles or wheezes. No reproducible chest wall tenderness Non tender to palpation  Breast: No asymetry,no masses or lumps. No tenderness. No nipple discharge or inversion. No axillary or supraclavicular adenopathy  Cardiovascular system; Heart sounds normal,  S1 and  S2 ,no S3.  No murmur, or thrill. Apical beat not displaced Peripheral pulses normal. EKG: NSR, no lVH, non specific t wave abnormality noted in anterior leads  Abdomen: Soft, non tender, no organomegaly or masses. No bruits. Bowel sounds normal. No guarding, tenderness or rebound.     Musculoskeletal exam: Full ROM of spine, hips , shoulders and knees. No deformity ,swelling or crepitus noted. No muscle wasting or atrophy.   Neurologic: Cranial nerves 2 to 12 intact. Power, tone ,sensation and  reflexes normal throughout. No disturbance in gait. No tremor.  Skin: Intact, no ulceration, erythema , scaling or rash noted. Pigmentation normal throughout  Psych; Depressed  mood and affect, poor eye contact Judgement and concentration normal   Assessment & Plan:  Annual physical exam Annual exam as documented. Counseling done  re healthy lifestyle involving commitment to 150 minutes exercise per week, heart healthy diet, and attaining healthy weight.The importance of adequate sleep also discussed. . Changes in health habits are decided on by the patient with goals and time frames  set for achieving them. Immunization and cancer screening needs are specifically addressed at this visit.   Need for pneumococcal vaccination After obtaining informed consent, the vaccine is  administered .   Chest pain Increased frequency of left sided chest pain noted in the past several months along with poor tolerance to activity. Generally not committed to exercise and has had even less physical activity in the past 4 to months as she her self states that she pushes herself even to get out the 3 days per week to collect her grand children EKG; no LVH, NSR, non specific t wave abnormalities noted , inverted t in anterior leads Pt is diabetic and has hyperlipidemia, refer to cardiology  At risk for cardiovascular event Type 2 diabetes, hyperlipidemia, metabolic syndrome, lack of physical activity, new non specific chest pain and abnormal EKG, refer to cardiology for further evaluation

## 2018-12-02 DIAGNOSIS — G894 Chronic pain syndrome: Secondary | ICD-10-CM | POA: Diagnosis not present

## 2018-12-06 ENCOUNTER — Encounter: Payer: Self-pay | Admitting: Cardiology

## 2018-12-06 ENCOUNTER — Other Ambulatory Visit: Payer: Self-pay | Admitting: Cardiology

## 2018-12-06 ENCOUNTER — Ambulatory Visit (INDEPENDENT_AMBULATORY_CARE_PROVIDER_SITE_OTHER): Payer: PPO | Admitting: Cardiology

## 2018-12-06 VITALS — BP 120/68 | HR 81 | Ht 63.0 in | Wt 189.0 lb

## 2018-12-06 DIAGNOSIS — Z8249 Family history of ischemic heart disease and other diseases of the circulatory system: Secondary | ICD-10-CM | POA: Diagnosis not present

## 2018-12-06 DIAGNOSIS — E8881 Metabolic syndrome: Secondary | ICD-10-CM

## 2018-12-06 DIAGNOSIS — E1169 Type 2 diabetes mellitus with other specified complication: Secondary | ICD-10-CM | POA: Diagnosis not present

## 2018-12-06 DIAGNOSIS — E785 Hyperlipidemia, unspecified: Secondary | ICD-10-CM | POA: Diagnosis not present

## 2018-12-06 DIAGNOSIS — Z01818 Encounter for other preprocedural examination: Secondary | ICD-10-CM | POA: Diagnosis not present

## 2018-12-06 DIAGNOSIS — R079 Chest pain, unspecified: Secondary | ICD-10-CM

## 2018-12-06 MED ORDER — METOPROLOL TARTRATE 50 MG PO TABS: 50.0000 mg | ORAL_TABLET | Freq: Once | ORAL | 0 refills | Status: DC

## 2018-12-06 NOTE — Progress Notes (Signed)
PCP: Fayrene Helper, MD  Clinic Note: Chief Complaint  Patient presents with  . New Patient (Initial Visit)    Family history of Heart Disas, + Metabolic Synrome (obese,HLD, DM-2)  . Chest Pain    at rest - L sided.     HPI: Stacey Smith is a 67 y.o. female with a PMH notable for obesity, and borderline hypertension/hyperlipidemia (essentially metabolic syndrome) who presents today for evaluation of chest pain.  She has been seen today at the request of Fayrene Helper, MD.  Stacey Smith was just seen on December 2nd by Dr. Moshe Cipro for annual physical exam.  She noted increasing exertional fatigue and exercise intolerance as well as intermittent episodes of left-sided anterior chest pain.  This is been going on for the last couple months.  She is therefore referred for cardiology evaluation. ->  She noted fatigue and social withdrawal in the last 4 months (related to grieving for her deceased partner)  Recent Hospitalizations: None  Studies Personally Reviewed - (if available, images/films reviewed: From Epic Chart or Care Everywhere)  No pertinent studies  Interval History: Stacey Smith presents here today for evaluation of substernal sharp stabbing chest discomfort which really occurs at either rest or with exertion.  He does not happen with any type of regularity, can happen with rolling over in bed or sitting up watching TV.  Can happen with walking.  The episodes only last about a second or 2 at a time but when they do occur they take her breath away. Other than this chest discomfort episode she does note some more fatigue and exercise intolerance, but has not been doing much for the last several months.  Partially because of grieving.  She acknowledges that she is somewhat deconditioned and out of shape and needs to lose weight, but has not made any active efforts.  She describes herself as "lazy" as the reason for not exercising.  She denies any PND, orthopnea with mild trace  edema at the end of the day. No palpitations, lightheadedness, dizziness, weakness or syncope/near syncope. No TIA/amaurosis fugax symptoms. No claudication.  ROS: A comprehensive was performed. Review of Systems  Constitutional: Positive for malaise/fatigue (See HPI). Negative for weight loss.  HENT: Negative for congestion and nosebleeds.   Respiratory: Negative for cough, hemoptysis, shortness of breath and wheezing.   Gastrointestinal: Positive for abdominal pain, constipation, diarrhea and nausea. Negative for blood in stool and melena.       She has IBS that has been acting up of late.  Intermittent episodes of diarrhea and constipation along with nausea.  Genitourinary: Negative for hematuria.  Musculoskeletal: Negative for back pain, falls, joint pain and myalgias (Cramping).  Neurological: Positive for dizziness (Sometimes when bending over) and headaches. Negative for focal weakness and weakness.  Psychiatric/Behavioral: Positive for depression (If not depression, dysthymia, grieving). Negative for memory loss. The patient has insomnia (Not sleeping well.). The patient is not nervous/anxious.   All other systems reviewed and are negative.  I have reviewed and (if needed) personally updated the patient's problem list, medications, allergies, past medical and surgical history, social and family history.   Past Medical History:  Diagnosis Date  . Allergy    seasonal intermittently  . Arthritis    spine  . Back pain   . Colon polyps   . Diabetes mellitus without complication ([place])   . GERD (gastroesophageal reflux disease)   . Headache(784.0)   . Hyperlipidemia    well controlled  .  Obesity   . S/P colonoscopy Oct 2007   Normal rectum, diminutive polyps in the left colon cold: TUBULAR ADENOMA  . TMJ (dislocation of temporomandibular joint)     Past Surgical History:  Procedure Laterality Date  . COLONOSCOPY  10/03/2011   Procedure: COLONOSCOPY;  Surgeon: Daneil Dolin,  MD;  [facility];  Service: Endoscopy;  Laterality: N/A;  10:30  . COLONOSCOPY N/A 11/01/2016   Procedure: COLONOSCOPY;  Surgeon: Daneil Dolin, MD;  [facility];  Service: Endoscopy;  Laterality: N/A;  9:30 Am  . excision of cyst for thyroid gland non cancer  1988  . POLYPECTOMY  11/01/2016   Procedure: POLYPECTOMY;  Surgeon: Daneil Dolin, MD;  [facility];  Service: Endoscopy;;  colon   . TMJ ARTHROPLASTY  09/23/2012   Procedure: TEMPOROMANDIBULAR JOINT (TMJ) ARTHROPLASTY;  Surgeon: Gae Bon, DDS;  Location: [place];  Service: Oral Surgery;  Laterality: Left;  Temporomandibular joint arthrotomy and meniscectomy  . TONSILLECTOMY  1960  . TOTAL ABDOMINAL HYSTERECTOMY  1985   right ovary fibroids     Current Meds  Medication Sig  . atorvastatin (LIPITOR) 20 MG tablet Take 1 tablet (20 mg total) by mouth daily.  . betamethasone dipropionate (DIPROLENE) 0.05 % cream Apply twice daily to affected area  For 10 days, then as  needed,  . fluticasone (FLONASE) 50 MCG/ACT nasal spray Place 2 sprays into both nostrils daily as needed. For allergies  . ibuprofen (ADVIL,MOTRIN) 800 MG tablet Take 1 tablet (800 mg total) by mouth every 8 (eight) hours as needed.  . metFORMIN (GLUCOPHAGE) 500 MG tablet Take 1 tablet (500 mg total) by mouth daily with breakfast.  . mupirocin nasal ointment (BACTROBAN NASAL) 2 % Apply intranasally twice daily for 5 days  . ranitidine (ZANTAC) 300 MG tablet Take 1 tablet (300 mg total) at bedtime by mouth.    Allergies  Allergen Reactions  . Tessalon Perles [Benzonatate] Dermatitis    rash    Social History   Tobacco Use  . Smoking status: Never Smoker  . Smokeless tobacco: Never Used  Substance Use Topics  . Alcohol use: Yes    Comment: occasionally   . Drug use: No   Social History   Social History Narrative   Recently lost her long-term partner (2019)    family history includes Asthma in her mother; Bipolar  disorder in her sister; Cancer in her mother and sister; Colon cancer in her sister; Coronary artery disease in her father; Depression in her sister; Diabetes in her father, sister, sister, and sister; Glaucoma in her mother; Heart failure in her father; Hypertension in her mother and sister; Kidney disease in her father; Stroke in her mother; Thyroid disease in her sister.  Wt Readings from Last 3 Encounters:  12/06/18 189 lb (85.7 kg)  11/25/18 187 lb (84.8 kg)  07/17/18 184 lb (83.5 kg)    PHYSICAL EXAM BP 120/68 (BP Location: Left Arm, Patient Position: Sitting, Cuff Size: Large)   Pulse 81   Ht 5\' 3"  (1.6 m)   Wt 189 lb (85.7 kg)   BMI 33.48 kg/m  Physical Exam  Constitutional: She is oriented to person, place, and time. She appears well-developed. No distress.  Obese.  Well-groomed.  Healthy-appearing  HENT:  Head: Normocephalic and atraumatic.  Mouth/Throat: Oropharynx is clear and moist.  Eyes: Pupils are equal, round, and reactive to light. Conjunctivae and EOM are normal. No scleral icterus.  Neck: Normal range  of motion. Neck supple. No hepatojugular reflux and no JVD present. Carotid bruit is not present. No thyromegaly present.  Cardiovascular: Normal rate, regular rhythm, S1 normal, S2 normal, intact distal pulses and normal pulses.  No extrasystoles are present. PMI is not displaced (Unable to palpate due to body habitus). Exam reveals distant heart sounds. Exam reveals no gallop and no friction rub.  No murmur heard. Pulmonary/Chest: Effort normal and breath sounds normal. No respiratory distress. She has no wheezes. She has no rales. She exhibits tenderness (Definite point tenderness along the left sternal border.  This is where her symptom is, it is reproducible on exam, but is usually more severe than when I push on it.).  Abdominal: Soft. Bowel sounds are normal. She exhibits no distension. There is no abdominal tenderness. There is no rebound.  Musculoskeletal: Normal  range of motion.        General: No edema.  Neurological: She is alert and oriented to person, place, and time. No cranial nerve deficit.  Skin: Skin is warm and dry.  Psychiatric: She has a normal mood and affect. Her behavior is normal. Judgment and thought content normal.  Vitals reviewed.    Adult ECG Report  Rate: 81 ;  Rhythm: normal sinus rhythm and Normal axis, intervals and durations.  Cannot exclude anterior MI, age undetermined. -Poor R wave progression.;   Narrative Interpretation: Relatively normal EKG.   Other studies Reviewed: Additional studies/ records that were reviewed today include:  Recent Labs:   Lab Results  Component Value Date   CREATININE 0.93 11/25/2018   BUN 18 11/25/2018   NA 138 11/25/2018   K 3.7 11/25/2018   CL 102 11/25/2018   CO2 28 11/25/2018   Lab Results  Component Value Date   CHOL 145 07/15/2018   HDL 47 (L) 07/15/2018   LDLCALC 78 07/15/2018   TRIG 122 07/15/2018   CHOLHDL 3.1 07/15/2018    ASSESSMENT / PLAN: Problem List Items Addressed This Visit    Chest pain at rest - Primary (Chronic)    Her chest pain seems relatively atypical and probably musculoskeletal in nature, however with it being at rest and with exertion associated with poor exercise tolerance and metabolic syndrome, I do think that is not unreasonable to evaluate for ischemia. I am somewhat leery of a Myoview stress test for resting pain in a somewhat obese woman.  I am fearful of false positive findings.  I am also interested in evaluating for baseline coronary disease that may not be seen with stress test.  Plan: Evaluate with coronary CT angiogram with possible FFR.  This allows for both anatomic and physiologic data.  It also allows [place] to indicate if there is any underlying CAD that is not ischemic in nature which would help drive more aggressive management of her risk factors.      Relevant Orders   Basic metabolic panel   CT CORONARY MORPH W/CTA COR W/SCORE  W/CA W/CM &/OR WO/CM   CT CORONARY FRACTIONAL FLOW RESERVE DATA PREP   CT CORONARY FRACTIONAL FLOW RESERVE FLUID ANALYSIS   Family history of heart disease (Chronic)    She not really sure the details of her heart disease in her family, but does have a family history.  We will evaluate baseline CAD risk in a patient with chest pain and metabolic syndrome with coronary CT angiogram.      Relevant Orders   Basic metabolic panel   CT CORONARY MORPH W/CTA COR W/SCORE W/CA W/CM &/  OR WO/CM   CT CORONARY FRACTIONAL FLOW RESERVE DATA PREP   CT CORONARY FRACTIONAL FLOW RESERVE FLUID ANALYSIS   Hyperlipidemia associated with type 2 diabetes mellitus (HCC) (Chronic)    Cholesterol panel looks pretty good depending on what her CAD risk is. Plan: Continue statin for now.  Unless severe CAD noted on coronary CTA, she is pretty much at goal.      Metabolic syndrome (Chronic)    Certainly at increased cardiac risk with hyperlipidemia, hypertension, obesity and borderline diabetes/diabetes.   Evaluating baseline coronary CAD with coronary CT angiogram.      Type 2 diabetes mellitus with other specified complication (HCC)   Relevant Orders   CT CORONARY MORPH W/CTA COR W/SCORE W/CA W/CM &/OR WO/CM   CT CORONARY FRACTIONAL FLOW RESERVE DATA PREP   CT CORONARY FRACTIONAL FLOW RESERVE FLUID ANALYSIS    Other Visit Diagnoses    Pre-op testing       Relevant Orders   Basic metabolic panel       I spent a total of 30 minutes with the patient and chart review. >  50% of the time was spent in direct patient consultation.   Current medicines are reviewed at length with the patient today.  (+/- concerns) n/a The following changes have been made:  n/a  Patient Instructions  Medication Instructions:  see instruction sheet  If you need a refill on your cardiac medications before your next appointment, please call your pharmacy.   Lab work: NOT NEEEDED If you have labs (blood work) drawn today and  your tests are completely normal, you will receive your results only by: Marland Kitchen MyChart Message (if you have MyChart) OR . A paper copy in the mail If you have any lab test that is abnormal or we need to change your treatment, we will call you to review the results.  Testing/Procedures: SCHEDULE AT [place] Your physician has requested that you have CORONARY CTA. Cardiac computed tomography (CT) is a painless test that uses an x-ray machine to take clear, detailed pictures of your heart. For further information please visit [facility]uge[facility]iest[facility][email or web address]. Please follow instruction sheet as given.     Follow-Up: At [facility], you and your health needs are our priority.  As part of our continuing mission to provide you with exceptional heart care, we have created designated Provider Care Teams.  These Care Teams include your primary Cardiologist (physician) and Advanced Practice Providers (APPs -  Physician Assistants and Nurse Practitioners) who all work together to provide you with the care you need, when you need it. You will need a follow up appointment in 2 months.  Please call our office 2 months in advance to schedule this appointment.  You may see Glenetta Hew, MD or one of the following Advanced Practice Providers on your designated Care Team:   Rosaria Ferries, PA-C . Jory Sims, DNP, ANP  Any Other Special Instructions Will Be Listed Below (If Applicable).     INSTRUCTIONS FOR  CORONARY CTA    Please arrive at the [facility] main entrance of [facility] at (30-45 minutes prior to test start time)  [address] [place], [place] [address] [phone]  Proceed to the [facility] Radiology Department (First Floor).  Please follow these instructions carefully (unless otherwise directed):  PLEASE HAVE LABS - BMP  AT LEAST ONE WEEK PRIOR TO TEST  On the Night Before the Test: . Drink plenty of water. Marland Kitchen  Do not consume  any caffeinated/decaffeinated beverages or chocolate 12 hours prior to your test. . Do not take any antihistamines 12 hours prior to your test.    On the Day of the Test: . Drink plenty of water. Do not drink any water within one hour of the test. . Do not eat any food 4 hours prior to the test. . You may take your regular medications prior to the test. . Take 50 mg of lopressor (metoprolol) two hour before the test. DO NOT TAKE METFORMIN THE DAY OF PROCEDURE OR 48 HOURS AFTER PROCEDURE  After the Test: . Drink plenty of water. . After receiving IV contrast, you may experience a mild flushed feeling. This is normal. . On occasion, you may experience a mild rash up to 24 hours after the test. This is not dangerous. If this occurs, you can take Benadryl 25 mg and increase your fluid intake. . If you experience trouble breathing, this can be serious. If it is severe call 911 IMMEDIATELY. If it is mild, please call our office.            Studies Ordered:   Orders Placed This Encounter  Procedures  . CT CORONARY MORPH W/CTA COR W/SCORE W/CA W/CM &/OR WO/CM  . CT CORONARY FRACTIONAL FLOW RESERVE DATA PREP  . CT CORONARY FRACTIONAL FLOW RESERVE FLUID ANALYSIS  . Basic metabolic panel      Glenetta Hew, M.D., M.S. Interventional Cardiologist   Pager # [phone] Phone # [phone] [address] [place], [place] [address]   Thank you for choosing Heartcare at [facility]!!

## 2018-12-06 NOTE — Patient Instructions (Signed)
Medication Instructions:  see instruction sheet  If you need a refill on your cardiac medications before your next appointment, please call your pharmacy.   Lab work: NOT NEEEDED If you have labs (blood work) drawn today and your tests are completely normal, you will receive your results only by: Marland Kitchen MyChart Message (if you have MyChart) OR . A paper copy in the mail If you have any lab test that is abnormal or we need to change your treatment, we will call you to review the results.  Testing/Procedures: SCHEDULE AT [place] Your physician has requested that you have CORONARY CTA. Cardiac computed tomography (CT) is a painless test that uses an x-ray machine to take clear, detailed pictures of your heart. For further information please visit [facility][email or web address]. Please follow instruction sheet as given.     Follow-Up: At [facility], you and your health needs are our priority.  As part of our continuing mission to provide you with exceptional heart care, we have created designated Provider Care Teams.  These Care Teams include your primary Cardiologist (physician) and Advanced Practice Providers (APPs -  Physician Assistants and Nurse Practitioners) who all work together to provide you with the care you need, when you need it. You will need a follow up appointment in 2 months.  Please call our office 2 months in advance to schedule this appointment.  You may see Glenetta Hew, MD or one of the following Advanced Practice Providers on your designated Care Team:   Rosaria Ferries, PA-C . Jory Sims, DNP, ANP  Any Other Special Instructions Will Be Listed Below (If Applicable).     INSTRUCTIONS FOR  CORONARY CTA    Please arrive at the [facility] main entrance of [facility] at (30-45 minutes prior to test start time)  [facility] [place] [facility], [place] [address] [phone]  Proceed to the [facility] Radiology  Department (First Floor).  Please follow these instructions carefully (unless otherwise directed):  PLEASE HAVE LABS - BMP  AT LEAST ONE WEEK PRIOR TO TEST  On the Night Before the Test: . Drink plenty of water. . Do not consume any caffeinated/decaffeinated beverages or chocolate 12 hours prior to your test. . Do not take any antihistamines 12 hours prior to your test.    On the Day of the Test: . Drink plenty of water. Do not drink any water within one hour of the test. . Do not eat any food 4 hours prior to the test. . You may take your regular medications prior to the test. . Take 50 mg of lopressor (metoprolol) two hour before the test. DO NOT TAKE METFORMIN THE DAY OF PROCEDURE OR 48 HOURS AFTER PROCEDURE  After the Test: . Drink plenty of water. . After receiving IV contrast, you may experience a mild flushed feeling. This is normal. . On occasion, you may experience a mild rash up to 24 hours after the test. This is not dangerous. If this occurs, you can take Benadryl 25 mg and increase your fluid intake. . If you experience trouble breathing, this can be serious. If it is severe call 911 IMMEDIATELY. If it is mild, please call our office.

## 2018-12-09 ENCOUNTER — Ambulatory Visit (HOSPITAL_COMMUNITY)
Admission: RE | Admit: 2018-12-09 | Discharge: 2018-12-09 | Disposition: A | Discharge status: Home / Self Care | Payer: PPO | Source: Ambulatory Visit | Attending: Family Medicine | Admitting: Family Medicine

## 2018-12-09 ENCOUNTER — Encounter: Payer: Self-pay | Admitting: Family Medicine

## 2018-12-09 ENCOUNTER — Encounter: Payer: Self-pay | Admitting: Cardiology

## 2018-12-09 DIAGNOSIS — Z780 Asymptomatic menopausal state: Secondary | ICD-10-CM

## 2018-12-09 DIAGNOSIS — Z13820 Encounter for screening for osteoporosis: Secondary | ICD-10-CM | POA: Diagnosis not present

## 2018-12-09 NOTE — Assessment & Plan Note (Signed)
Her chest pain seems relatively atypical and probably musculoskeletal in nature, however with it being at rest and with exertion associated with poor exercise tolerance and metabolic syndrome, I do think that is not unreasonable to evaluate for ischemia. I am somewhat leery of a Myoview stress test for resting pain in a somewhat obese woman.  I am fearful of false positive findings.  I am also interested in evaluating for baseline coronary disease that may not be seen with stress test.  Plan: Evaluate with coronary CT angiogram with possible FFR.  This allows for both anatomic and physiologic data.  It also allows [place] to indicate if there is any underlying CAD that is not ischemic in nature which would help drive more aggressive management of her risk factors.

## 2018-12-09 NOTE — Assessment & Plan Note (Signed)
She not really sure the details of her heart disease in her family, but does have a family history.  We will evaluate baseline CAD risk in a patient with chest pain and metabolic syndrome with coronary CT angiogram.

## 2018-12-09 NOTE — Assessment & Plan Note (Signed)
Certainly at increased cardiac risk with hyperlipidemia, hypertension, obesity and borderline diabetes/diabetes.   Evaluating baseline coronary CAD with coronary CT angiogram.

## 2018-12-09 NOTE — Assessment & Plan Note (Signed)
Cholesterol panel looks pretty good depending on what her CAD risk is. Plan: Continue statin for now.  Unless severe CAD noted on coronary CTA, she is pretty much at goal.

## 2018-12-10 ENCOUNTER — Other Ambulatory Visit: Payer: Self-pay | Admitting: Cardiology

## 2018-12-11 NOTE — Addendum Note (Signed)
Addended by: Venetia Maxon on: 12/11/2018 02:06 PM   Modules accepted: Orders

## 2018-12-25 HISTORY — PX: OTHER SURGICAL HISTORY: SHX169

## 2019-01-07 DIAGNOSIS — Z01818 Encounter for other preprocedural examination: Secondary | ICD-10-CM | POA: Diagnosis not present

## 2019-01-07 DIAGNOSIS — Z8249 Family history of ischemic heart disease and other diseases of the circulatory system: Secondary | ICD-10-CM | POA: Diagnosis not present

## 2019-01-07 DIAGNOSIS — R079 Chest pain, unspecified: Secondary | ICD-10-CM | POA: Diagnosis not present

## 2019-01-07 DIAGNOSIS — E8881 Metabolic syndrome: Secondary | ICD-10-CM | POA: Diagnosis not present

## 2019-01-08 LAB — BASIC METABOLIC PANEL
BUN / CREAT RATIO: 20 (ref 12–28)
BUN: 16 mg/dL (ref 8–27)
CALCIUM: 9.5 mg/dL (ref 8.7–10.3)
CO2: 24 mmol/L (ref 20–29)
Chloride: 107 mmol/L — ABNORMAL HIGH (ref 96–106)
Creatinine, Ser: 0.81 mg/dL (ref 0.57–1.00)
GFR, EST AFRICAN AMERICAN: 88 mL/min/{1.73_m2} (ref >59)
GFR, EST NON AFRICAN AMERICAN: 76 mL/min/{1.73_m2} (ref >59)
Glucose: 88 mg/dL (ref 65–99)
Potassium: 4 mmol/L (ref 3.5–5.2)
Sodium: 143 mmol/L (ref 134–144)

## 2019-01-20 ENCOUNTER — Telehealth (HOSPITAL_COMMUNITY): Payer: Self-pay | Admitting: Emergency Medicine

## 2019-01-20 NOTE — Telephone Encounter (Signed)
Left message on voicemail with name and callback number Sedona Wenk RN Navigator Cardiac Imaging [facility] [phone] Office [phone] Cell  

## 2019-01-21 ENCOUNTER — Ambulatory Visit (HOSPITAL_COMMUNITY)
Admission: RE | Admit: 2019-01-21 | Discharge: 2019-01-21 | Disposition: A | Discharge status: Home / Self Care | Payer: Medicare HMO | Source: Ambulatory Visit | Attending: Cardiology | Admitting: Cardiology

## 2019-01-21 ENCOUNTER — Encounter (HOSPITAL_COMMUNITY): Payer: Self-pay

## 2019-01-21 ENCOUNTER — Ambulatory Visit (HOSPITAL_COMMUNITY): Admission: RE | Admit: 2019-01-21 | Payer: Medicare HMO | Source: Ambulatory Visit

## 2019-01-21 DIAGNOSIS — Z8249 Family history of ischemic heart disease and other diseases of the circulatory system: Secondary | ICD-10-CM | POA: Insufficient documentation

## 2019-01-21 DIAGNOSIS — R079 Chest pain, unspecified: Secondary | ICD-10-CM | POA: Insufficient documentation

## 2019-01-21 DIAGNOSIS — E8881 Metabolic syndrome: Secondary | ICD-10-CM | POA: Insufficient documentation

## 2019-01-21 DIAGNOSIS — E1169 Type 2 diabetes mellitus with other specified complication: Secondary | ICD-10-CM | POA: Insufficient documentation

## 2019-01-21 MED ORDER — IOPAMIDOL (ISOVUE-370) INJECTION 76%
80.0000 mL | Freq: Once | INTRAVENOUS | Status: AC | PRN
  Administered 2019-01-21: 80 mL via INTRAVENOUS

## 2019-01-21 MED ORDER — NITROGLYCERIN 0.4 MG SL SUBL
SUBLINGUAL_TABLET | SUBLINGUAL | Status: AC
  Filled 2019-01-21: qty 2

## 2019-01-21 MED ORDER — NITROGLYCERIN 0.4 MG SL SUBL
0.8000 mg | SUBLINGUAL_TABLET | Freq: Once | SUBLINGUAL | Status: AC
  Administered 2019-01-21: 0.8 mg via SUBLINGUAL
  Filled 2019-01-21: qty 25

## 2019-02-07 ENCOUNTER — Ambulatory Visit (INDEPENDENT_AMBULATORY_CARE_PROVIDER_SITE_OTHER): Payer: Medicare HMO | Admitting: Cardiology

## 2019-02-07 ENCOUNTER — Encounter: Payer: Self-pay | Admitting: Cardiology

## 2019-02-07 VITALS — BP 132/78 | HR 94 | Ht 63.0 in | Wt 186.2 lb

## 2019-02-07 DIAGNOSIS — M5136 Other intervertebral disc degeneration, lumbar region: Secondary | ICD-10-CM | POA: Diagnosis not present

## 2019-02-07 DIAGNOSIS — Z9189 Other specified personal risk factors, not elsewhere classified: Secondary | ICD-10-CM | POA: Diagnosis not present

## 2019-02-07 DIAGNOSIS — M545 Low back pain, unspecified: Secondary | ICD-10-CM | POA: Insufficient documentation

## 2019-02-07 DIAGNOSIS — E1169 Type 2 diabetes mellitus with other specified complication: Secondary | ICD-10-CM

## 2019-02-07 DIAGNOSIS — R079 Chest pain, unspecified: Secondary | ICD-10-CM

## 2019-02-07 DIAGNOSIS — E785 Hyperlipidemia, unspecified: Secondary | ICD-10-CM | POA: Diagnosis not present

## 2019-02-07 NOTE — Progress Notes (Signed)
PCP: Fayrene Helper, MD  Clinic Note: Chief Complaint  Patient presents with  . Follow-up    Coronary CT angiogram results    HPI: Stacey Smith is a 68 y.o. female with a PMH notable for obesity, and borderline hypertension/hyperlipidemia (essentially metabolic syndrome) who presents today for F/U evaluation of DOE/Fatigue & CP.Marland Kitchen  She was seen in initial consultation on December 06, 2018 at the request of Fayrene Helper, MD   [name] was initially seen with complaints of substernal chest pain that was described as a sharp stabbing discomfort both at rest and with exertion.  No irregularity to it.  Also noted having some increased fatigue and social withdrawal over the last 4 months since grieving over the death of her husband.  Chest pain episodes lasted may be 5 to 10 seconds at the most.  Would take her breath away, but she did not have any exertional dyspnea.  Just limited exercise intolerance, indicating that she is basically out of shape.  just seen on December 2nd by Dr. Moshe Cipro for annual physical exam.  She noted increasing exertional fatigue and exercise intolerance as well as intermittent episodes of left-sided anterior chest pain.  This is been going on for the last couple months.  She is therefore referred for cardiology evaluation. ->  She noted fatigue and social withdrawal in the last 4 months (related to grieving for her deceased partner)  Recent Hospitalizations: None  Studies Personally Reviewed - (if available, images/films reviewed: From Epic Chart or Care Everywhere)  Coronary CTA January 21, 2019:  Coronary calcium score of 0. This was 0 percentile for age & sex matched control. Normal coronary origin with right dominance. No evidence of CAD.    Interval History: Stacey Smith presents here today still having intermittent chest discomfort off and on, but again with no frequency.  Right now she is got a headache and is just been much hurting all over from her  shoulders back hips etc.  General body aching, but no fevers or chills. It seems like the chest discomfort is the least of her concerns at this time.  She denies any nausea vomiting. I could not really tell if she is having that much more discomfort in the chest than she had been having before because she did not come right out and tell me yes or no.  My suspicion is that the splitting episodes are still present, but not to any significant degree. No PND, orthopnea edema.  No rapid irregular heartbeats, or palpitations to suggest an arrhythmia.  No skipped beats..  No syncope/near syncope or TIA/amaurosis fugax.  No melena, hematochezia materia.  ROS: A comprehensive was performed. Review of Systems  Constitutional: Positive for malaise/fatigue (Pretty much not wanting to do anything.  Has some anhedonia component and just general body aches.). Negative for weight loss.  HENT: Negative for congestion and nosebleeds.   Respiratory: Negative for cough, hemoptysis, shortness of breath and wheezing.   Gastrointestinal: Positive for abdominal pain, constipation, diarrhea and nausea. Negative for blood in stool, heartburn and melena.       She has IBS that has been acting up of late.  Intermittent episodes of diarrhea and constipation along with nausea.  Genitourinary: Negative for hematuria.  Musculoskeletal: Positive for back pain and joint pain. Negative for falls and myalgias (Cramping).       General body aches. -Hips, back, neck  Neurological: Positive for headaches. Negative for dizziness (Sometimes when bending over), focal weakness  and weakness.  Psychiatric/Behavioral: Positive for depression (If not depression, dysthymia, grieving). Negative for memory loss. The patient has insomnia (Not sleeping well.). The patient is not nervous/anxious.   All other systems reviewed and are negative.  I have reviewed and (if needed) personally updated the patient's problem list, medications, allergies, past  medical and surgical history, social and family history.   Past Medical History:  Diagnosis Date  . Allergy    seasonal intermittently  . Arthritis    spine  . Back pain   . Colon polyps   . Diabetes mellitus without complication ([place])   . GERD (gastroesophageal reflux disease)   . Headache(784.0)   . Hyperlipidemia    well controlled  . Obesity   . S/P colonoscopy Oct 2007   Normal rectum, diminutive polyps in the left colon cold: TUBULAR ADENOMA  . TMJ (dislocation of temporomandibular joint)     Past Surgical History:  Procedure Laterality Date  . COLONOSCOPY  10/03/2011   Procedure: COLONOSCOPY;  Surgeon: Daneil Dolin, MD;  [facility];  Service: Endoscopy;  Laterality: N/A;  10:30  . COLONOSCOPY N/A 11/01/2016   Procedure: COLONOSCOPY;  Surgeon: Daneil Dolin, MD;  [facility];  Service: Endoscopy;  Laterality: N/A;  9:30 Am  . CORONARY CT ANGIOGRAM  12/2018   Coronary calcium score of 0. This was 0 percentile for age & sex matched control. Normal coronary origin with right dominance. No evidence of CAD.   Marland Kitchen excision of cyst for thyroid gland non cancer  1988  . POLYPECTOMY  11/01/2016   Procedure: POLYPECTOMY;  Surgeon: Daneil Dolin, MD;  [facility];  Service: Endoscopy;;  colon   . TMJ ARTHROPLASTY  09/23/2012   Procedure: TEMPOROMANDIBULAR JOINT (TMJ) ARTHROPLASTY;  Surgeon: Gae Bon, DDS;  Location: [place];  Service: Oral Surgery;  Laterality: Left;  Temporomandibular joint arthrotomy and meniscectomy  . TONSILLECTOMY  1960  . TOTAL ABDOMINAL HYSTERECTOMY  1985   right ovary fibroids     Current Meds  Medication Sig  . atorvastatin (LIPITOR) 20 MG tablet Take 1 tablet (20 mg total) by mouth daily.  . betamethasone dipropionate (DIPROLENE) 0.05 % cream Apply twice daily to affected area  For 10 days, then as  needed,  . fluticasone (FLONASE) 50 MCG/ACT nasal spray Place 2 sprays into both nostrils daily as needed. For  allergies  . ibuprofen (ADVIL,MOTRIN) 800 MG tablet Take 1 tablet (800 mg total) by mouth every 8 (eight) hours as needed.  . metFORMIN (GLUCOPHAGE) 500 MG tablet Take 1 tablet (500 mg total) by mouth daily with breakfast.  . metoprolol tartrate (LOPRESSOR) 50 MG tablet TAKE 1 TABLET 1 HOUR PRIOR TO SCHEDULED CARDIAC TEST  . mupirocin nasal ointment (BACTROBAN NASAL) 2 % Apply intranasally twice daily for 5 days  . ranitidine (ZANTAC) 300 MG tablet Take 1 tablet (300 mg total) at bedtime by mouth.    Allergies  Allergen Reactions  . Tessalon Perles [Benzonatate] Dermatitis    rash    Social History   Tobacco Use  . Smoking status: Never Smoker  . Smokeless tobacco: Never Used  Substance Use Topics  . Alcohol use: Yes    Comment: occasionally   . Drug use: No   Social History   Social History Narrative   Recently lost her long-term partner (2019)    family history includes Asthma in her mother; Bipolar disorder in her sister; Cancer in her mother and sister; Colon  cancer in her sister; Coronary artery disease in her father; Depression in her sister; Diabetes in her father, sister, sister, and sister; Glaucoma in her mother; Heart failure in her father; Hypertension in her mother and sister; Kidney disease in her father; Stroke in her mother; Thyroid disease in her sister.  Wt Readings from Last 3 Encounters:  02/07/19 186 lb 3.2 oz (84.5 kg)  12/06/18 189 lb (85.7 kg)  11/25/18 187 lb (84.8 kg)    PHYSICAL EXAM BP 132/78   Pulse 94   Ht 5\' 3"  (1.6 m)   Wt 186 lb 3.2 oz (84.5 kg)   BMI 32.98 kg/m  Physical Exam  Constitutional: She is oriented to person, place, and time. She appears well-developed and well-nourished. No distress.  Obese.  Well-groomed.  Healthy-appearing  HENT:  Head: Normocephalic and atraumatic.  Mouth/Throat: Oropharynx is clear and moist.  Neck: No hepatojugular reflux present. Carotid bruit is not present.  Cardiovascular: Normal rate, regular  rhythm, S1 normal, S2 normal, intact distal pulses and normal pulses.  No extrasystoles are present. PMI is not displaced (Unable to palpate due to body habitus). Exam reveals distant heart sounds. Exam reveals no gallop and no friction rub.  No murmur heard. Pulmonary/Chest: Effort normal and breath sounds normal. No respiratory distress. She has no wheezes. She has no rales. She exhibits tenderness (Definite point tenderness along the left sternal border.  This is where her symptom is, it is reproducible on exam, but is usually more severe than when I push on it.).  Neurological: She is alert and oriented to person, place, and time.  Psychiatric: Her behavior is normal. Judgment and thought content normal.  Still seems somewhat down/depressed in mood.  Vitals reviewed.    Adult ECG Report Not checked   Other studies Reviewed: Additional studies/ records that were reviewed today include:  Recent Labs:   Lab Results  Component Value Date   CREATININE 0.81 01/07/2019   BUN 16 01/07/2019   NA 143 01/07/2019   K 4.0 01/07/2019   CL 107 (H) 01/07/2019   CO2 24 01/07/2019   Lab Results  Component Value Date   CHOL 145 07/15/2018   HDL 47 (L) 07/15/2018   LDLCALC 78 07/15/2018   TRIG 122 07/15/2018   CHOLHDL 3.1 07/15/2018    ASSESSMENT / PLAN: Problem List Items Addressed This Visit    At risk for cardiovascular event    Reassuring results on Coronary Calcium Score and CTA --> continue risk factor modification for diabetic with hypertension.  Target LDL less than 100, and target A1c less than 6.      Chest pain at rest - Primary (Chronic)    Very atypical features of chest discomfort.  Low likelihood of coronary disease based on description, but with risk factors we did check a coronary CTA which confirmed no evidence of CAD, in fact minimal coronary calcium score.  This is a very reassuring result for her.  At would argue that the symptom was not related to cardiac  disease. Would simply treat chest pain is musculoskeletal chest pain with PRN NSAIDs or Tylenol.      Hyperlipidemia associated with type 2 diabetes mellitus ([place]) (Chronic)    Pretty well controlled currently on current dose of statin.  No change necessary based on relatively reassuring Coronary Calcium Score and Coronary CT Angiogram.          I spent a total of 15-20 minutes with the patient and chart review. >  50%  of the time was spent in direct patient consultation.   Current medicines are reviewed at length with the patient today.  (+/- concerns) n/a The following changes have been made:  n/a  Patient Instructions  Medication Instructions:  NOT NEEDED If you need a refill on your cardiac medications before your next appointment, please call your pharmacy.   Lab work: NOT NEEDED If you have labs (blood work) drawn today and your tests are completely normal, you will receive your results only by: Marland Kitchen MyChart Message (if you have MyChart) OR . A paper copy in the mail If you have any lab test that is abnormal or we need to change your treatment, we will call you to review the results.  Testing/Procedures: NOT NEEDED  Follow-Up: At [facility], you and your health needs are our priority.  As part of our continuing mission to provide you with exceptional heart care, we have created designated Provider Care Teams.  These Care Teams include your primary Cardiologist (physician) and Advanced Practice Providers (APPs -  Physician Assistants and Nurse Practitioners) who all work together to provide you with the care you need, when you need it. . Your physician recommends that you schedule a follow-up appointment ON AN AS NEEDED BASIS .   Any Other Special Instructions Will Be Listed Below (If Applicable).       Studies Ordered:   No orders of the defined types were placed in this encounter.     Glenetta Hew, M.D., M.S. Interventional Cardiologist   Pager #  [phone] Phone # [phone] [address]. [place], [place] [address]   Thank you for choosing Heartcare at [facility]!!

## 2019-02-07 NOTE — Patient Instructions (Signed)
Medication Instructions:  NOT NEEDED If you need a refill on your cardiac medications before your next appointment, please call your pharmacy.   Lab work: NOT NEEDED If you have labs (blood work) drawn today and your tests are completely normal, you will receive your results only by: . MyChart Message (if you have MyChart) OR . A paper copy in the mail If you have any lab test that is abnormal or we need to change your treatment, we will call you to review the results.  Testing/Procedures: NOT NEEDED  Follow-Up: At [facility], you and your health needs are our priority.  As part of our continuing mission to provide you with exceptional heart care, we have created designated Provider Care Teams.  These Care Teams include your primary Cardiologist (physician) and Advanced Practice Providers (APPs -  Physician Assistants and Nurse Practitioners) who all work together to provide you with the care you need, when you need it. . Your physician recommends that you schedule a follow-up appointment ON AN  AS NEEDED BASIS .   Any Other Special Instructions Will Be Listed Below (If Applicable).   

## 2019-02-09 ENCOUNTER — Encounter: Payer: Self-pay | Admitting: Cardiology

## 2019-02-09 NOTE — Assessment & Plan Note (Signed)
Pretty well controlled currently on current dose of statin.  No change necessary based on relatively reassuring Coronary Calcium Score and Coronary CT Angiogram.

## 2019-02-09 NOTE — Assessment & Plan Note (Signed)
Reassuring results on Coronary Calcium Score and CTA --> continue risk factor modification for diabetic with hypertension.  Target LDL less than 100, and target A1c less than 6.

## 2019-02-09 NOTE — Assessment & Plan Note (Signed)
Very atypical features of chest discomfort.  Low likelihood of coronary disease based on description, but with risk factors we did check a coronary CTA which confirmed no evidence of CAD, in fact minimal coronary calcium score.  This is a very reassuring result for her.  At would argue that the symptom was not related to cardiac disease. Would simply treat chest pain is musculoskeletal chest pain with PRN NSAIDs or Tylenol.

## 2019-03-11 ENCOUNTER — Other Ambulatory Visit: Payer: Self-pay

## 2019-03-11 ENCOUNTER — Encounter: Payer: Self-pay | Admitting: Family Medicine

## 2019-03-11 MED ORDER — IBUPROFEN 800 MG PO TABS: 800.0000 mg | ORAL_TABLET | Freq: Three times a day (TID) | ORAL | 0 refills | Status: DC | PRN

## 2019-04-11 DIAGNOSIS — E7849 Other hyperlipidemia: Secondary | ICD-10-CM | POA: Diagnosis not present

## 2019-04-11 DIAGNOSIS — E1169 Type 2 diabetes mellitus with other specified complication: Secondary | ICD-10-CM | POA: Diagnosis not present

## 2019-04-12 LAB — LIPID PANEL
Cholesterol: 156 mg/dL (ref <200)
HDL: 56 mg/dL (ref >=50)
LDL CHOLESTEROL (CALC): 82 mg/dL
Non-HDL Cholesterol (Calc): 100 mg/dL (calc) (ref <130)
TRIGLYCERIDES: 85 mg/dL (ref <150)
Total CHOL/HDL Ratio: 2.8 (calc) (ref <5.0)

## 2019-04-12 LAB — COMPLETE METABOLIC PANEL WITH GFR
AG Ratio: 1.4 (calc) (ref 1.0–2.5)
ALBUMIN MSPROF: 4.1 g/dL (ref 3.6–5.1)
ALKALINE PHOSPHATASE (APISO): 139 U/L (ref 37–153)
ALT: 15 U/L (ref 6–29)
AST: 13 U/L (ref 10–35)
BILIRUBIN TOTAL: 0.6 mg/dL (ref 0.2–1.2)
BUN: 15 mg/dL (ref 7–25)
CHLORIDE: 105 mmol/L (ref 98–110)
CO2: 25 mmol/L (ref 20–32)
Calcium: 9.8 mg/dL (ref 8.6–10.4)
Creat: 0.79 mg/dL (ref 0.50–0.99)
GFR, EST AFRICAN AMERICAN: 90 mL/min/{1.73_m2} (ref >=60)
GFR, Est Non African American: 78 mL/min/{1.73_m2} (ref >=60)
GLOBULIN: 3 g/dL (ref 1.9–3.7)
Glucose, Bld: 119 mg/dL — ABNORMAL HIGH (ref 65–99)
Potassium: 4.3 mmol/L (ref 3.5–5.3)
Sodium: 140 mmol/L (ref 135–146)
Total Protein: 7.1 g/dL (ref 6.1–8.1)

## 2019-04-12 LAB — HEMOGLOBIN A1C
HEMOGLOBIN A1C: 6.9 %{Hb} — AB (ref <5.7)
Mean Plasma Glucose: 151 (calc)
eAG (mmol/L): 8.4 (calc)

## 2019-04-14 ENCOUNTER — Ambulatory Visit: Payer: PPO | Admitting: Family Medicine

## 2019-04-23 ENCOUNTER — Ambulatory Visit: Payer: Medicare HMO | Admitting: Family Medicine

## 2019-04-23 ENCOUNTER — Other Ambulatory Visit: Payer: Self-pay

## 2019-05-23 ENCOUNTER — Other Ambulatory Visit: Payer: Self-pay | Admitting: Family Medicine

## 2019-06-11 IMAGING — MG 2D DIGITAL SCREENING BILATERAL MAMMOGRAM WITH CAD AND ADJUNCT TO
8 series · 8 of 24 positions shown · non-contrast
Comparison: Previous exam(s).

CLINICAL DATA: Screening.

EXAM:
2D DIGITAL SCREENING BILATERAL MAMMOGRAM WITH CAD AND ADJUNCT TOMO

[R MLO]
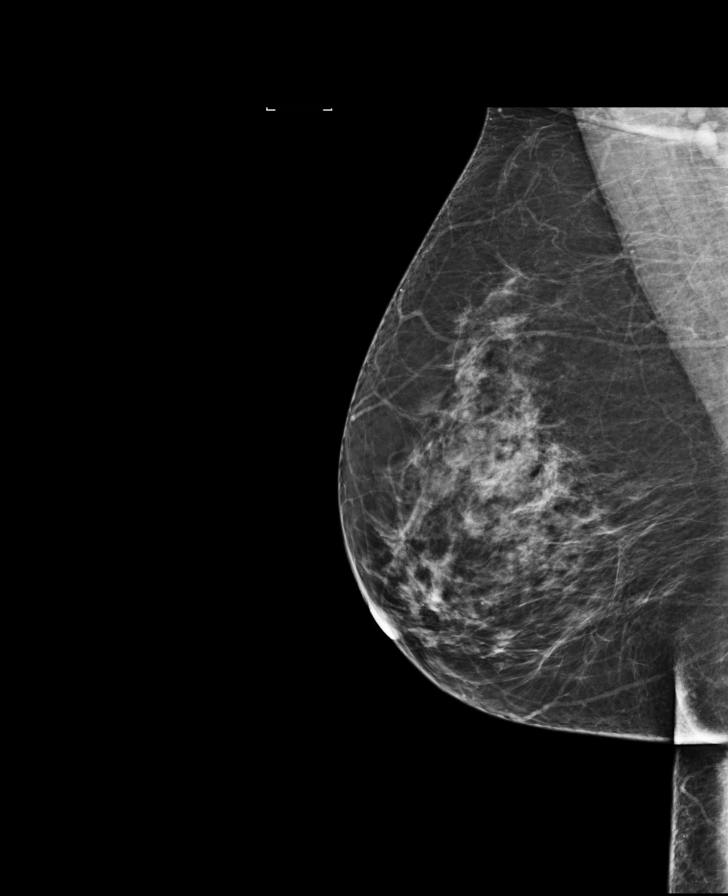

[L MLO]
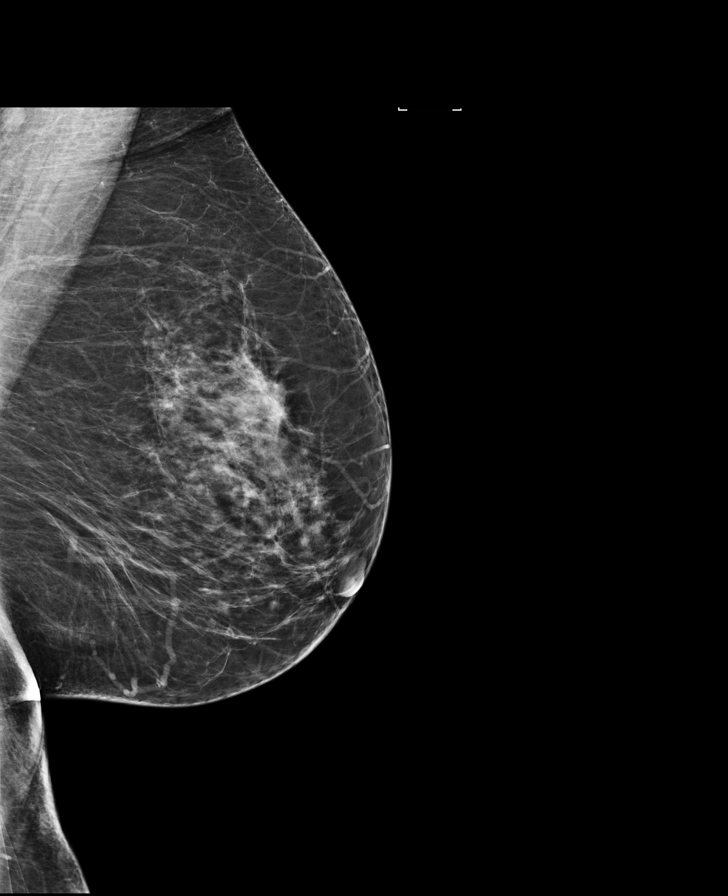

[R CC]
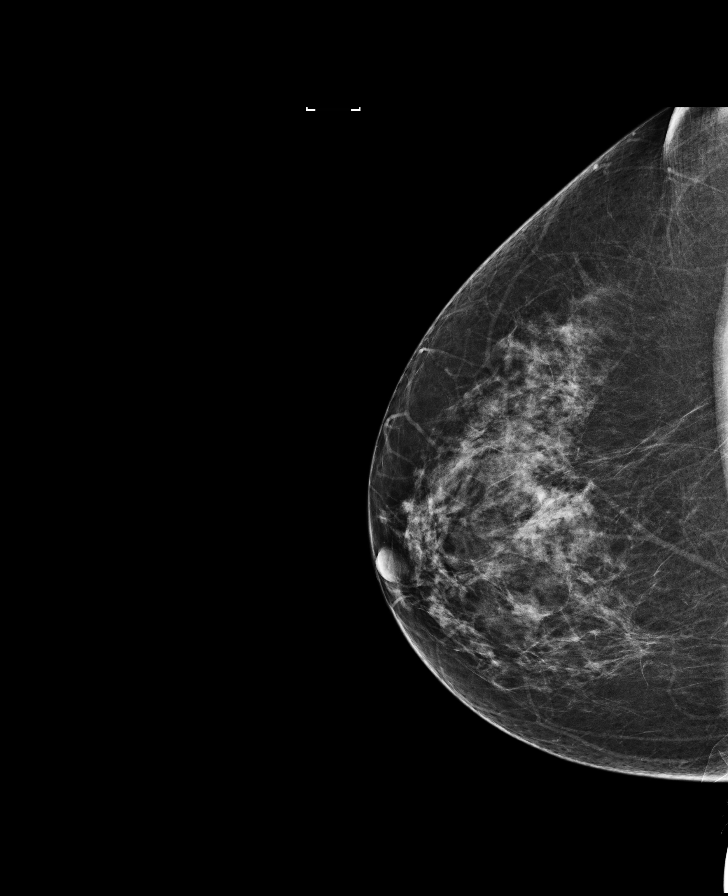

[L CC]
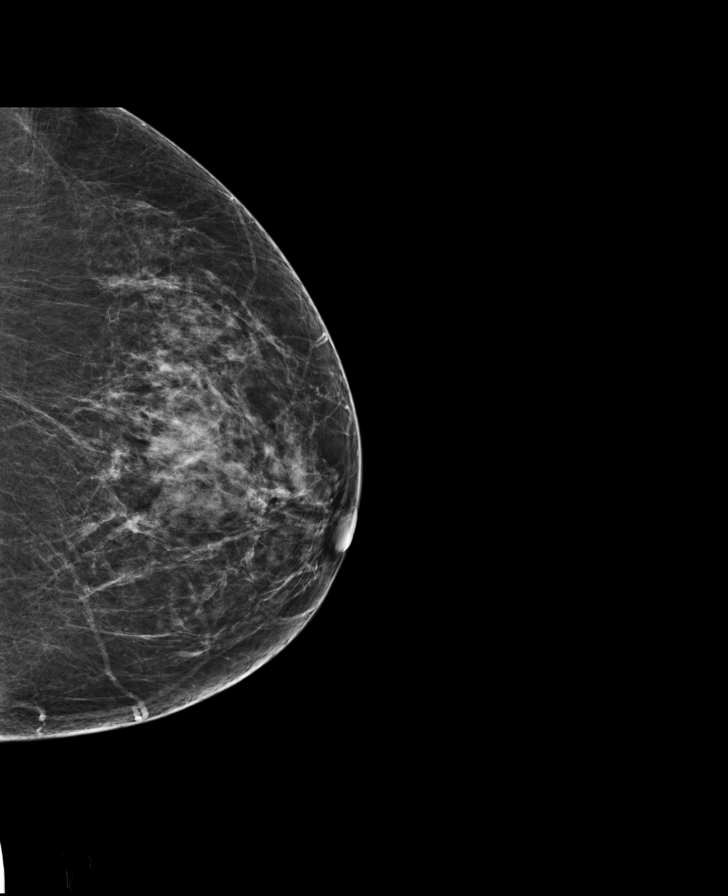

[R MLO tomo · tomo slice 39/76.0]
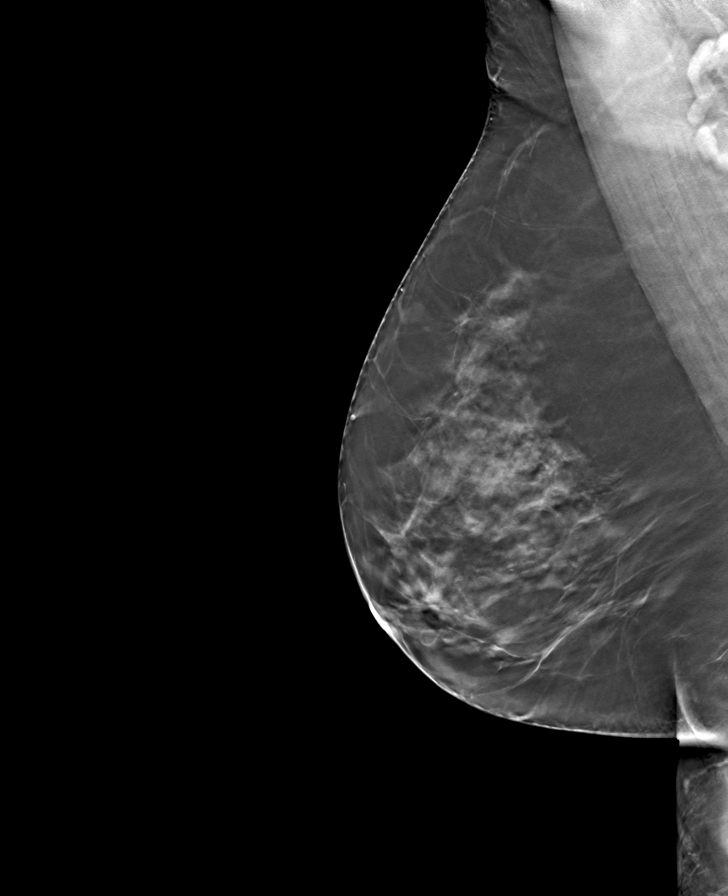

[L CC tomo · tomo slice 36/71.0]
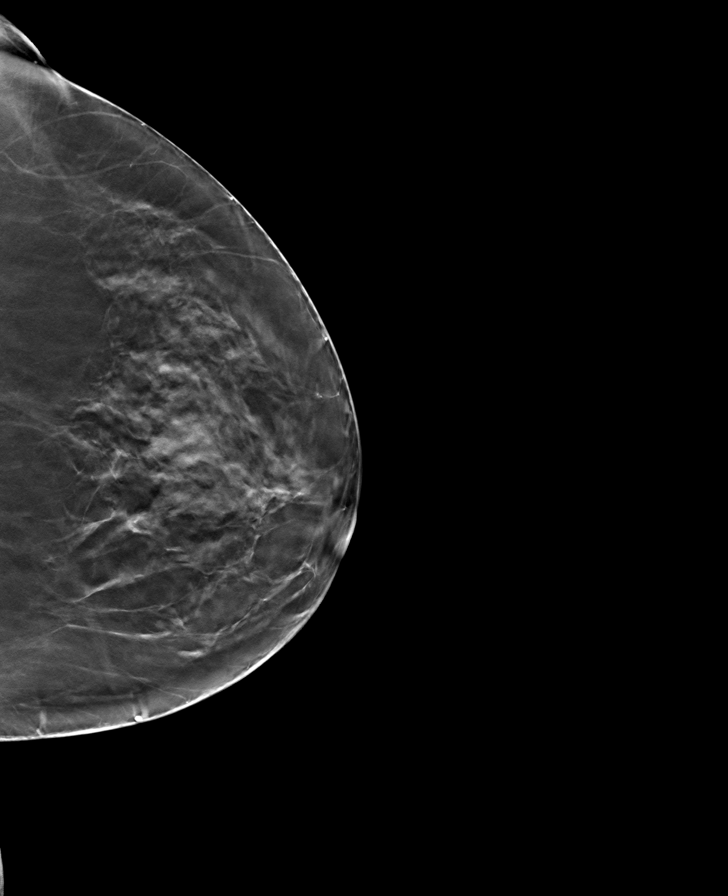

[L MLO tomo · tomo slice 41/80.0]
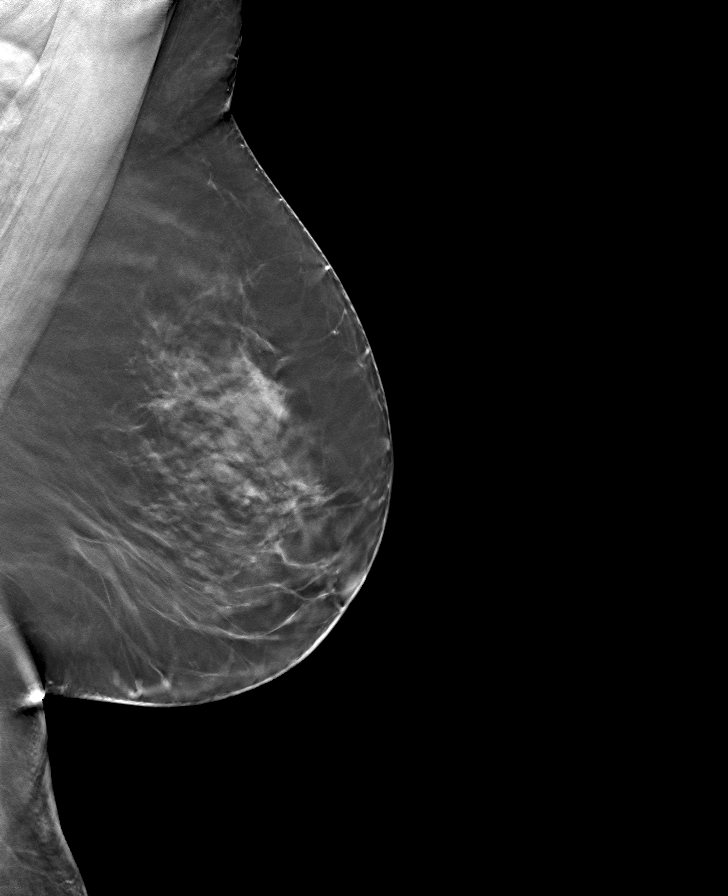

[R CC tomo · tomo slice 38/75.0]
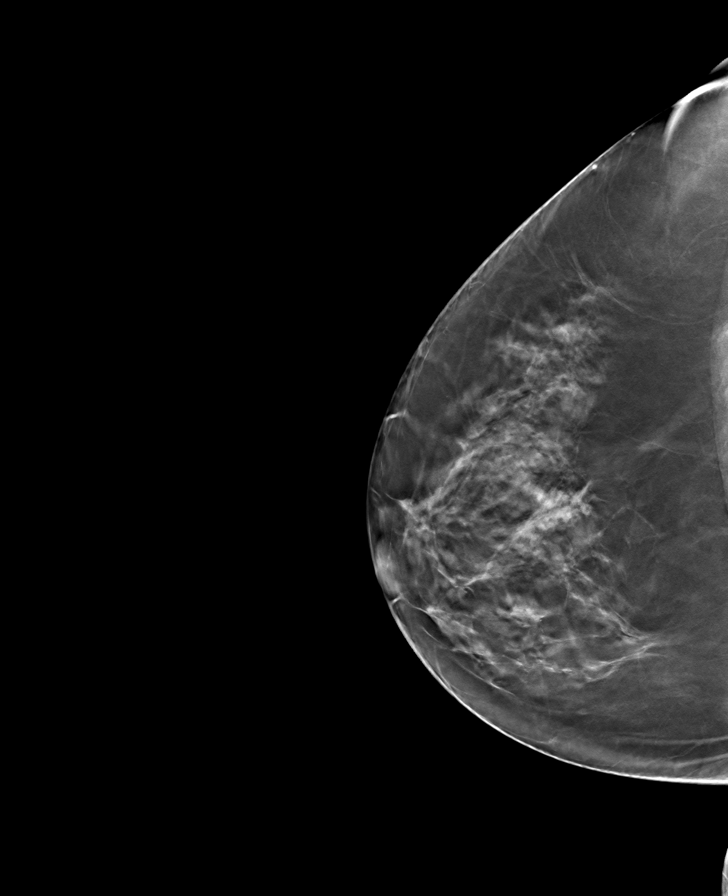

[8 of 24 positions shown; findings below may reference images not displayed]

ACR Breast Density Category c: The breast tissue is heterogeneously
dense, which may obscure small masses.
FINDINGS: There are no findings suspicious for malignancy. Images were
processed with CAD.
IMPRESSION: No mammographic evidence of malignancy. A result letter of this
screening mammogram will be mailed directly to the patient.

RECOMMENDATION:
Screening mammogram in one year. (Code:TN-[id number])

BI-RADS CATEGORY  1: Negative.

## 2019-07-01 ENCOUNTER — Other Ambulatory Visit (HOSPITAL_COMMUNITY): Payer: Self-pay | Admitting: Family Medicine

## 2019-07-01 DIAGNOSIS — Z1231 Encounter for screening mammogram for malignant neoplasm of breast: Secondary | ICD-10-CM

## 2019-07-08 ENCOUNTER — Other Ambulatory Visit: Payer: Self-pay

## 2019-07-08 ENCOUNTER — Ambulatory Visit (INDEPENDENT_AMBULATORY_CARE_PROVIDER_SITE_OTHER): Payer: Medicare HMO | Admitting: Family Medicine

## 2019-07-08 ENCOUNTER — Other Ambulatory Visit: Payer: Medicare HMO

## 2019-07-08 ENCOUNTER — Encounter: Payer: Self-pay | Admitting: Family Medicine

## 2019-07-08 VITALS — BP 135/82 | HR 69 | Temp 98.0°F | Ht 63.0 in | Wt 184.1 lb

## 2019-07-08 DIAGNOSIS — Z0000 Encounter for general adult medical examination without abnormal findings: Secondary | ICD-10-CM

## 2019-07-08 DIAGNOSIS — Z20822 Contact with and (suspected) exposure to covid-19: Secondary | ICD-10-CM

## 2019-07-08 DIAGNOSIS — G44001 Cluster headache syndrome, unspecified, intractable: Secondary | ICD-10-CM | POA: Diagnosis not present

## 2019-07-08 DIAGNOSIS — R6889 Other general symptoms and signs: Secondary | ICD-10-CM | POA: Diagnosis not present

## 2019-07-08 MED ORDER — IBUPROFEN 800 MG PO TABS: 800.0000 mg | ORAL_TABLET | Freq: Three times a day (TID) | ORAL | 0 refills | Status: DC | PRN

## 2019-07-08 NOTE — Progress Notes (Signed)
Subjective:   Stacey Smith is a 68 y.o. female who presents for Medicare Annual (Subsequent) preventive examination.  Review of Systems:   Cardiac Risk Factors include: advanced age (>16men, >66 women);diabetes mellitus;dyslipidemia;hypertension;sedentary lifestyle;obesity (BMI >30kg/m2)     Objective:     Vitals: BP 135/82 (BP Location: Left Arm, Patient Position: Sitting, Cuff Size: Normal)   Pulse 69   Temp 98 F (36.7 C) (Oral)   Ht 5\' 3"  (1.6 m)   Wt 184 lb 1.3 oz (83.5 kg)   SpO2 99%   BMI 32.61 kg/m   Body mass index is 32.61 kg/m.  Advanced Directives 06/04/2018 11/01/2016 03/24/2015 09/23/2012 09/18/2012 10/03/2011  Does Patient Have a Medical Advance Directive? No Yes Yes Patient has advance directive, copy not in chart Patient has advance directive, copy not in chart Patient has advance directive, copy not in chart  Type of Advance Directive - Living will;Healthcare Power of [place];Living will  Copy of [place] in Chart? - No - copy requested - - - -  Would patient like information on creating a medical advance directive? Yes (MAU/Ambulatory/Procedural Areas - Information given) - - - - -  Pre-existing out of facility DNR order (yellow form or pink MOST form) - - - - - No    Tobacco Social History   Tobacco Use  Smoking Status Never Smoker  Smokeless Tobacco Never Used     Counseling given: Not Answered   Clinical Intake:  Pre-visit preparation completed: Yes  Pain : No/denies pain Pain Score: 0-No pain     Diabetes: No([place])           Past Medical History:  Diagnosis Date  . Allergy    seasonal intermittently  . Arthritis    spine  . Back pain   . Colon polyps   . Diabetes mellitus without complication ([place])   . GERD (gastroesophageal reflux disease)   . Headache(784.0)   . Hyperlipidemia    well controlled  . Obesity   . S/P colonoscopy Oct 2007    Normal rectum, diminutive polyps in the left colon cold: TUBULAR ADENOMA  . TMJ (dislocation of temporomandibular joint)    Past Surgical History:  Procedure Laterality Date  . COLONOSCOPY  10/03/2011   Procedure: COLONOSCOPY;  Surgeon: Daneil Dolin, MD;  [facility];  Service: Endoscopy;  Laterality: N/A;  10:30  . COLONOSCOPY N/A 11/01/2016   Procedure: COLONOSCOPY;  Surgeon: Daneil Dolin, MD;  [facility];  Service: Endoscopy;  Laterality: N/A;  9:30 Am  . CORONARY CT ANGIOGRAM  12/2018   Coronary calcium score of 0. This was 0 percentile for age & sex matched control. Normal coronary origin with right dominance. No evidence of CAD.   Marland Kitchen excision of cyst for thyroid gland non cancer  1988  . POLYPECTOMY  11/01/2016   Procedure: POLYPECTOMY;  Surgeon: Daneil Dolin, MD;  [facility];  Service: Endoscopy;;  colon   . TMJ ARTHROPLASTY  09/23/2012   Procedure: TEMPOROMANDIBULAR JOINT (TMJ) ARTHROPLASTY;  Surgeon: Gae Bon, DDS;  Location: [place];  Service: Oral Surgery;  Laterality: Left;  Temporomandibular joint arthrotomy and meniscectomy  . TONSILLECTOMY  1960  . TOTAL ABDOMINAL HYSTERECTOMY  1985   right ovary fibroids    Family History  Problem Relation Age of Onset  . Stroke Mother   . Hypertension Mother   . Cancer Mother  breast   . Glaucoma Mother   . Asthma Mother   . Coronary artery disease Father   . Diabetes Father   . Heart failure Father   . Kidney disease Father   . Diabetes Sister   . Hypertension Sister   . Diabetes Sister   . Cancer Sister        colon   . Diabetes Sister   . Depression Sister   . Colon cancer Sister   . Thyroid disease Sister   . Bipolar disorder Sister    Social History   Socioeconomic History  . Marital status: Divorced    Spouse name: Not on file  . Number of children: 2  . Years of education: Not on file  . Highest education level: Not on file  Occupational History  .  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 Financial resource strain: Not on file  . Food insecurity    Worry: Not on file    Inability: Not on file  . Transportation needs    Medical: Not on file    Non-medical: Not on file  Tobacco Use  . Smoking status: Never Smoker  . Smokeless tobacco: Never Used  Substance and Sexual Activity  . Alcohol use: Yes    Comment: occasionally   . Drug use: No  . Sexual activity: Not on file  Lifestyle  . Physical activity    Days per week: Not on file    Minutes per session: Not on file  . Stress: Only a little  Relationships  . Social Herbalist on phone: Not on file    Gets together: Not on file    Attends religious service: Not on file    Active member of club or organization: Not on file    Attends meetings of clubs or organizations: Not on file    Relationship status: Not on file  Other Topics Concern  . Not on file  Social History Narrative   Recently lost her long-term partner (2019)    Outpatient Encounter Medications as of 07/08/2019  Medication Sig  . atorvastatin (LIPITOR) 20 MG tablet TAKE 1 TABLET BY MOUTH EVERY DAY  . betamethasone dipropionate (DIPROLENE) 0.05 % cream Apply twice daily to affected area  For 10 days, then as  needed,  . fluticasone (FLONASE) 50 MCG/ACT nasal spray Place 2 sprays into both nostrils daily as needed. For allergies  . ibuprofen (ADVIL,MOTRIN) 800 MG tablet Take 1 tablet (800 mg total) by mouth every 8 (eight) hours as needed.  . metFORMIN (GLUCOPHAGE) 500 MG tablet TAKE 1 TABLET BY MOUTH DAILY WITH BREAKFAST  . metoprolol tartrate (LOPRESSOR) 50 MG tablet TAKE 1 TABLET 1 HOUR PRIOR TO SCHEDULED CARDIAC TEST  . mupirocin nasal ointment (BACTROBAN NASAL) 2 % Apply intranasally twice daily for 5 days  . ranitidine (ZANTAC) 300 MG tablet Take 1 tablet (300 mg total) at bedtime by mouth.   No facility-administered encounter  medications on file as of 07/08/2019.     Activities of Daily Living No flowsheet data found.  Patient Care Team: Fayrene Helper, MD as PCP - General Ellyn Hack Leonie Green, MD as PCP - Cardiology (Cardiology) Gala Romney Cristopher Estimable, MD (Gastroenterology)    Assessment:   This is a routine wellness examination for Stacey Smith.  Exercise Activities and Dietary recommendations Current Exercise Habits: The patient does not participate in regular exercise at present, Exercise  limited by: cardiac condition(s)  Goals   None     Fall Risk Fall Risk  07/08/2019 11/25/2018 07/17/2018 06/18/2018 06/18/2018  Falls in the past year? 0 0 No No No  Number falls in past yr: 0 0 - - -  Injury with Fall? 0 0 - - -   Is the patient's home free of loose throw rugs in walkways, pet beds, electrical cords, etc?   yes      Grab bars in the bathroom? no      Handrails on the stairs?   no stairs       Adequate lighting?   yes  Timed Get Up and Go performed:   Depression Screen PHQ 2/9 Scores 07/08/2019 11/25/2018 07/17/2018 06/18/2018  PHQ - 2 Score 0 0 0 0  PHQ- 9 Score - - - -  Exception Documentation Medical reason - - -     Cognitive Function     6CIT Screen 07/08/2019  What Year? 0 points  What month? 0 points  What time? 0 points  Count back from 20 0 points  Months in reverse 0 points  Repeat phrase 0 points  Total Score 0    Immunization History  Administered Date(s) Administered  . Influenza Split 11/23/2011, 10/23/2014  . Influenza Whole 01/30/2008, 09/27/2010, 09/24/2012  . Influenza, High Dose Seasonal PF 11/06/2018  . Influenza,inj,Quad PF,6+ Mos 11/03/2014, 09/10/2015, 08/25/2016, 11/05/2017  . Pneumococcal Conjugate-13 03/04/2015, 11/01/2017  . Pneumococcal Polysaccharide-23 11/25/2018  . Td 05/24/2004  . Tdap 07/01/2014  . Zoster 01/16/2013    Qualifies for Shingles Vaccine? Completed   Screening Tests Health Maintenance  Topic Date Due  . FOOT EXAM  04/22/2019  . URINE  MICROALBUMIN  07/16/2019  . INFLUENZA VACCINE  07/26/2019  . OPHTHALMOLOGY EXAM  08/01/2019  . HEMOGLOBIN A1C  10/11/2019  . MAMMOGRAM  06/26/2020  . COLONOSCOPY  11/01/2021  . TETANUS/TDAP  07/01/2024  . DEXA SCAN  Completed  . Hepatitis C Screening  Completed  . PNA vac Low Risk Adult  Completed    Cancer Screenings: Lung: Low Dose CT Chest recommended if Age 50-80 years, 30 pack-year currently smoking OR have quit w/in 15years. Patient does not qualify. Breast:  Up to date on Mammogram? Yes   Up to date of Bone Density/Dexa? Yes Colorectal: Due 2022   Additional Screenings:  Hepatitis C Screening: Completed      Plan:     1. Encounter for Medicare annual wellness exam   I have personally reviewed and noted the following in the patient's chart:   . Medical and social history . Use of alcohol, tobacco or illicit drugs  . Current medications and supplements . Functional ability and status . Nutritional status . Physical activity . Advanced directives . List of other physicians . Hospitalizations, surgeries, and ER visits in previous 12 months . Vitals . Screenings to include cognitive, depression, and falls . Referrals and appointments  In addition, I have reviewed and discussed with patient certain preventive protocols, quality metrics, and best practice recommendations. A written personalized care plan for preventive services as well as general preventive health recommendations were provided to patient.     Perlie Mayo, NP  07/08/2019

## 2019-07-08 NOTE — Patient Instructions (Addendum)
[name] , Thank you for taking time to come for your Medicare Wellness Visit. I appreciate your ongoing commitment to your health goals. Please review the following plan we discussed and let me know if I can assist you in the future.   Please continue to practice social distancing to keep you, your family, and our community safe.  If you must go out, please wear a Mask and practice good handwashing.  Screening recommendations/referrals: Colonoscopy: Due 2022 Mammogram: July 2020 Bone Density: Completed  Recommended yearly ophthalmology/optometry visit for glaucoma screening and checkup Recommended yearly dental visit for hygiene and checkup  Vaccinations: Influenza vaccine: Due Fall 2020 Pneumococcal vaccine: Completed Tdap vaccine: Due 2025 Shingles vaccine: Completed    Advanced directives: Bring in paperwork when you can  Conditions/risks identified: Falls  Next appointment: 12/03/2019    Preventive Care 67 Years and Older, Female Preventive care refers to lifestyle choices and visits with your health care provider that can promote health and wellness. What does preventive care include?  A yearly physical exam. This is also called an annual well check.  Dental exams once or twice a year.  Routine eye exams. Ask your health care provider how often you should have your eyes checked.  Personal lifestyle choices, including:  Daily care of your teeth and gums.  Regular physical activity.  Eating a healthy diet.  Avoiding tobacco and drug use.  Limiting alcohol use.  Practicing safe sex.  Taking low-dose aspirin every day.  Taking vitamin and mineral supplements as recommended by your health care provider. What happens during an annual well check? The services and screenings done by your health care provider during your annual well check will depend on your age, overall health, lifestyle risk factors, and family history of disease. Counseling  Your health care  provider may ask you questions about your:  Alcohol use.  Tobacco use.  Drug use.  Emotional well-being.  Home and relationship well-being.  Sexual activity.  Eating habits.  History of falls.  Memory and ability to understand (cognition).  Work and work Statistician.  Reproductive health. Screening  You may have the following tests or measurements:  Height, weight, and BMI.  Blood pressure.  Lipid and cholesterol levels. These may be checked every 5 years, or more frequently if you are over 67 years old.  Skin check.  Lung cancer screening. You may have this screening every year starting at age 42 if you have a 30-pack-year history of smoking and currently smoke or have quit within the past 15 years.  Fecal occult blood test (FOBT) of the stool. You may have this test every year starting at age 34.  Flexible sigmoidoscopy or colonoscopy. You may have a sigmoidoscopy every 5 years or a colonoscopy every 10 years starting at age 29.  Hepatitis C blood test.  Hepatitis B blood test.  Sexually transmitted disease (STD) testing.  Diabetes screening. This is done by checking your blood sugar (glucose) after you have not eaten for a while (fasting). You may have this done every 1-3 years.  Bone density scan. This is done to screen for osteoporosis. You may have this done starting at age 47.  Mammogram. This may be done every 1-2 years. Talk to your health care provider about how often you should have regular mammograms. Talk with your health care provider about your test results, treatment options, and if necessary, the need for more tests. Vaccines  Your health care provider may recommend certain vaccines, such as:  Influenza vaccine. This is recommended every year.  Tetanus, diphtheria, and acellular pertussis (Tdap, Td) vaccine. You may need a Td booster every 10 years.  Zoster vaccine. You may need this after age 85.  Pneumococcal 13-valent conjugate (PCV13)  vaccine. One dose is recommended after age 5.  Pneumococcal polysaccharide (PPSV23) vaccine. One dose is recommended after age 14. Talk to your health care provider about which screenings and vaccines you need and how often you need them. This information is not intended to replace advice given to you by your health care provider. Make sure you discuss any questions you have with your health care provider. Document Released: 01/07/2016 Document Revised: 08/30/2016 Document Reviewed: 10/12/2015 Elsevier Interactive Patient Education  2017 [place] Prevention in the Home Falls can cause injuries. They can happen to people of all ages. There are many things you can do to make your home safe and to help prevent falls. What can I do on the outside of my home?  Regularly fix the edges of walkways and driveways and fix any cracks.  Remove anything that might make you trip as you walk through a door, such as a raised step or threshold.  Trim any bushes or trees on the path to your home.  Use bright outdoor lighting.  Clear any walking paths of anything that might make someone trip, such as rocks or tools.  Regularly check to see if handrails are loose or broken. Make sure that both sides of any steps have handrails.  Any raised decks and porches should have guardrails on the edges.  Have any leaves, snow, or ice cleared regularly.  Use sand or salt on walking paths during winter.  Clean up any spills in your garage right away. This includes oil or grease spills. What can I do in the bathroom?  Use night lights.  Install grab bars by the toilet and in the tub and shower. Do not use towel bars as grab bars.  Use non-skid mats or decals in the tub or shower.  If you need to sit down in the shower, use a plastic, non-slip stool.  Keep the floor dry. Clean up any water that spills on the floor as soon as it happens.  Remove soap buildup in the tub or shower regularly.   Attach bath mats securely with double-sided non-slip rug tape.  Do not have throw rugs and other things on the floor that can make you trip. What can I do in the bedroom?  Use night lights.  Make sure that you have a light by your bed that is easy to reach.  Do not use any sheets or blankets that are too big for your bed. They should not hang down onto the floor.  Have a firm chair that has side arms. You can use this for support while you get dressed.  Do not have throw rugs and other things on the floor that can make you trip. What can I do in the kitchen?  Clean up any spills right away.  Avoid walking on wet floors.  Keep items that you use a lot in easy-to-reach places.  If you need to reach something above you, use a strong step stool that has a grab bar.  Keep electrical cords out of the way.  Do not use floor polish or wax that makes floors slippery. If you must use wax, use non-skid floor wax.  Do not have throw rugs and other things on the floor that  can make you trip. What can I do with my stairs?  Do not leave any items on the stairs.  Make sure that there are handrails on both sides of the stairs and use them. Fix handrails that are broken or loose. Make sure that handrails are as long as the stairways.  Check any carpeting to make sure that it is firmly attached to the stairs. Fix any carpet that is loose or worn.  Avoid having throw rugs at the top or bottom of the stairs. If you do have throw rugs, attach them to the floor with carpet tape.  Make sure that you have a light switch at the top of the stairs and the bottom of the stairs. If you do not have them, ask someone to add them for you. What else can I do to help prevent falls?  Wear shoes that:  Do not have high heels.  Have rubber bottoms.  Are comfortable and fit you well.  Are closed at the toe. Do not wear sandals.  If you use a stepladder:  Make sure that it is fully opened. Do not climb  a closed stepladder.  Make sure that both sides of the stepladder are locked into place.  Ask someone to hold it for you, if possible.  Clearly mark and make sure that you can see:  Any grab bars or handrails.  First and last steps.  Where the edge of each step is.  Use tools that help you move around (mobility aids) if they are needed. These include:  Canes.  Walkers.  Scooters.  Crutches.  Turn on the lights when you go into a dark area. Replace any light bulbs as soon as they burn out.  Set up your furniture so you have a clear path. Avoid moving your furniture around.  If any of your floors are uneven, fix them.  If there are any pets around you, be aware of where they are.  Review your medicines with your doctor. Some medicines can make you feel dizzy. This can increase your chance of falling. Ask your doctor what other things that you can do to help prevent falls. This information is not intended to replace advice given to you by your health care provider. Make sure you discuss any questions you have with your health care provider. Document Released: 10/07/2009 Document Revised: 05/18/2016 Document Reviewed: 01/15/2015 Elsevier Interactive Patient Education  2017 [employer].

## 2019-07-12 LAB — NOVEL CORONAVIRUS, NAA: SARS-CoV-2, NAA: NOT DETECTED

## 2019-07-21 ENCOUNTER — Ambulatory Visit (HOSPITAL_COMMUNITY)
Admission: RE | Admit: 2019-07-21 | Discharge: 2019-07-21 | Disposition: A | Discharge status: Home / Self Care | Payer: Medicare HMO | Source: Ambulatory Visit | Attending: Family Medicine | Admitting: Family Medicine

## 2019-07-21 ENCOUNTER — Other Ambulatory Visit: Payer: Self-pay

## 2019-07-21 DIAGNOSIS — Z1231 Encounter for screening mammogram for malignant neoplasm of breast: Secondary | ICD-10-CM | POA: Diagnosis not present

## 2019-08-22 ENCOUNTER — Other Ambulatory Visit: Payer: Self-pay | Admitting: Family Medicine

## 2019-09-08 ENCOUNTER — Other Ambulatory Visit: Payer: Self-pay

## 2019-09-08 ENCOUNTER — Ambulatory Visit (INDEPENDENT_AMBULATORY_CARE_PROVIDER_SITE_OTHER): Payer: Medicare HMO

## 2019-09-08 DIAGNOSIS — Z23 Encounter for immunization: Secondary | ICD-10-CM | POA: Diagnosis not present

## 2019-09-25 ENCOUNTER — Telehealth: Payer: Self-pay

## 2019-09-25 DIAGNOSIS — E8881 Metabolic syndrome: Secondary | ICD-10-CM

## 2019-09-25 DIAGNOSIS — E785 Hyperlipidemia, unspecified: Secondary | ICD-10-CM

## 2019-09-25 DIAGNOSIS — R7303 Prediabetes: Secondary | ICD-10-CM

## 2019-09-25 DIAGNOSIS — E559 Vitamin D deficiency, unspecified: Secondary | ICD-10-CM

## 2019-09-25 DIAGNOSIS — E1169 Type 2 diabetes mellitus with other specified complication: Secondary | ICD-10-CM

## 2019-09-25 NOTE — Telephone Encounter (Signed)
Labs ordered per MD 

## 2019-10-20 DIAGNOSIS — M79671 Pain in right foot: Secondary | ICD-10-CM | POA: Diagnosis not present

## 2019-10-20 DIAGNOSIS — M205X1 Other deformities of toe(s) (acquired), right foot: Secondary | ICD-10-CM | POA: Diagnosis not present

## 2019-10-20 DIAGNOSIS — B353 Tinea pedis: Secondary | ICD-10-CM | POA: Diagnosis not present

## 2019-10-20 DIAGNOSIS — M79672 Pain in left foot: Secondary | ICD-10-CM | POA: Diagnosis not present

## 2019-10-20 DIAGNOSIS — E139 Other specified diabetes mellitus without complications: Secondary | ICD-10-CM | POA: Diagnosis not present

## 2019-10-20 DIAGNOSIS — M205X2 Other deformities of toe(s) (acquired), left foot: Secondary | ICD-10-CM | POA: Diagnosis not present

## 2019-11-18 DIAGNOSIS — E139 Other specified diabetes mellitus without complications: Secondary | ICD-10-CM | POA: Diagnosis not present

## 2019-11-18 DIAGNOSIS — M205X1 Other deformities of toe(s) (acquired), right foot: Secondary | ICD-10-CM | POA: Diagnosis not present

## 2019-11-18 DIAGNOSIS — M205X2 Other deformities of toe(s) (acquired), left foot: Secondary | ICD-10-CM | POA: Diagnosis not present

## 2019-11-18 DIAGNOSIS — B353 Tinea pedis: Secondary | ICD-10-CM | POA: Diagnosis not present

## 2019-11-27 DIAGNOSIS — E785 Hyperlipidemia, unspecified: Secondary | ICD-10-CM | POA: Diagnosis not present

## 2019-11-27 DIAGNOSIS — E559 Vitamin D deficiency, unspecified: Secondary | ICD-10-CM | POA: Diagnosis not present

## 2019-11-27 DIAGNOSIS — E8881 Metabolic syndrome: Secondary | ICD-10-CM | POA: Diagnosis not present

## 2019-11-27 DIAGNOSIS — E1169 Type 2 diabetes mellitus with other specified complication: Secondary | ICD-10-CM | POA: Diagnosis not present

## 2019-11-28 ENCOUNTER — Encounter: Payer: Self-pay | Admitting: Family Medicine

## 2019-11-28 LAB — CBC
HCT: 40 % (ref 35.0–45.0)
Hemoglobin: 12.2 g/dL (ref 11.7–15.5)
MCH: 22.7 pg — ABNORMAL LOW (ref 27.0–33.0)
MCHC: 30.5 g/dL — ABNORMAL LOW (ref 32.0–36.0)
MCV: 74.3 fL — ABNORMAL LOW (ref 80.0–100.0)
MPV: 9.8 fL (ref 7.5–12.5)
Platelets: 251 10*3/uL (ref 140–400)
RBC: 5.38 10*6/uL — ABNORMAL HIGH (ref 3.80–5.10)
RDW: 15.4 % — ABNORMAL HIGH (ref 11.0–15.0)
WBC: 7.9 10*3/uL (ref 3.8–10.8)

## 2019-11-28 LAB — VITAMIN D 25 HYDROXY (VIT D DEFICIENCY, FRACTURES): Vit D, 25-Hydroxy: 34 ng/mL (ref 30–100)

## 2019-11-28 LAB — COMPLETE METABOLIC PANEL WITH GFR
AG Ratio: 1.4 (calc) (ref 1.0–2.5)
ALT: 15 U/L (ref 6–29)
AST: 14 U/L (ref 10–35)
Albumin: 4.3 g/dL (ref 3.6–5.1)
Alkaline phosphatase (APISO): 139 U/L (ref 37–153)
BUN: 12 mg/dL (ref 7–25)
CO2: 25 mmol/L (ref 20–32)
Calcium: 9.7 mg/dL (ref 8.6–10.4)
Chloride: 103 mmol/L (ref 98–110)
Creat: 0.69 mg/dL (ref 0.50–0.99)
GFR, Est African American: 104 mL/min/{1.73_m2} (ref >=60)
GFR, Est Non African American: 90 mL/min/{1.73_m2} (ref >=60)
Globulin: 3 g/dL (calc) (ref 1.9–3.7)
Glucose, Bld: 97 mg/dL (ref 65–99)
Potassium: 4.5 mmol/L (ref 3.5–5.3)
Sodium: 139 mmol/L (ref 135–146)
Total Bilirubin: 0.7 mg/dL (ref 0.2–1.2)
Total Protein: 7.3 g/dL (ref 6.1–8.1)

## 2019-11-28 LAB — MICROALBUMIN, URINE: Microalb, Ur: 0.2 mg/dL

## 2019-11-28 LAB — LIPID PANEL
Cholesterol: 159 mg/dL (ref <200)
HDL: 52 mg/dL (ref >=50)
LDL Cholesterol (Calc): 86 mg/dL (calc)
Non-HDL Cholesterol (Calc): 107 mg/dL (calc) (ref <130)
Total CHOL/HDL Ratio: 3.1 (calc) (ref <5.0)
Triglycerides: 111 mg/dL (ref <150)

## 2019-11-28 LAB — TSH: TSH: 1.56 mIU/L (ref 0.40–4.50)

## 2019-11-28 LAB — HEMOGLOBIN A1C
Hgb A1c MFr Bld: 6.9 % of total Hgb — ABNORMAL HIGH (ref <5.7)
Mean Plasma Glucose: 151 (calc)
eAG (mmol/L): 8.4 (calc)

## 2019-12-03 ENCOUNTER — Encounter: Payer: Medicare HMO | Admitting: Family Medicine

## 2019-12-05 DIAGNOSIS — Z822 Family history of deafness and hearing loss: Secondary | ICD-10-CM | POA: Diagnosis not present

## 2019-12-05 DIAGNOSIS — Z7289 Other problems related to lifestyle: Secondary | ICD-10-CM | POA: Diagnosis not present

## 2019-12-05 DIAGNOSIS — H6123 Impacted cerumen, bilateral: Secondary | ICD-10-CM | POA: Diagnosis not present

## 2019-12-05 DIAGNOSIS — H9313 Tinnitus, bilateral: Secondary | ICD-10-CM | POA: Diagnosis not present

## 2019-12-08 ENCOUNTER — Ambulatory Visit (INDEPENDENT_AMBULATORY_CARE_PROVIDER_SITE_OTHER): Payer: Medicare HMO | Admitting: Family Medicine

## 2019-12-08 ENCOUNTER — Other Ambulatory Visit: Payer: Self-pay

## 2019-12-08 ENCOUNTER — Encounter: Payer: Self-pay | Admitting: Family Medicine

## 2019-12-08 DIAGNOSIS — M5410 Radiculopathy, site unspecified: Secondary | ICD-10-CM | POA: Diagnosis not present

## 2019-12-08 DIAGNOSIS — Z0001 Encounter for general adult medical examination with abnormal findings: Secondary | ICD-10-CM

## 2019-12-08 DIAGNOSIS — M5416 Radiculopathy, lumbar region: Secondary | ICD-10-CM

## 2019-12-08 DIAGNOSIS — M542 Cervicalgia: Secondary | ICD-10-CM

## 2019-12-08 DIAGNOSIS — Z0000 Encounter for general adult medical examination without abnormal findings: Secondary | ICD-10-CM

## 2019-12-08 DIAGNOSIS — M62838 Other muscle spasm: Secondary | ICD-10-CM | POA: Diagnosis not present

## 2019-12-08 DIAGNOSIS — G44001 Cluster headache syndrome, unspecified, intractable: Secondary | ICD-10-CM

## 2019-12-08 MED ORDER — METFORMIN HCL 500 MG PO TABS: ORAL_TABLET | ORAL | 1 refills | Status: DC

## 2019-12-08 MED ORDER — FLUTICASONE PROPIONATE 50 MCG/ACT NA SUSP: 2.0000 | Freq: Every day | NASAL | 5 refills | Status: DC | PRN

## 2019-12-08 MED ORDER — DIAZEPAM 5 MG PO TABS: ORAL_TABLET | ORAL | 0 refills | Status: DC

## 2019-12-08 MED ORDER — PREDNISONE 20 MG PO TABS: ORAL_TABLET | ORAL | 0 refills | Status: DC

## 2019-12-08 MED ORDER — KETOROLAC TROMETHAMINE 60 MG/2ML IM SOLN
60.0000 mg | Freq: Once | INTRAMUSCULAR | Status: AC
  Administered 2019-12-08: 60 mg via INTRAMUSCULAR

## 2019-12-08 MED ORDER — IBUPROFEN 800 MG PO TABS: 800.0000 mg | ORAL_TABLET | Freq: Three times a day (TID) | ORAL | 0 refills | Status: DC | PRN

## 2019-12-08 MED ORDER — FAMOTIDINE 40 MG PO TABS: 40.0000 mg | ORAL_TABLET | Freq: Every day | ORAL | 0 refills | Status: DC

## 2019-12-08 MED ORDER — ATORVASTATIN CALCIUM 20 MG PO TABS: 20.0000 mg | ORAL_TABLET | Freq: Every day | ORAL | 1 refills | Status: DC

## 2019-12-08 MED ORDER — METHYLPREDNISOLONE ACETATE 80 MG/ML IJ SUSP
80.0000 mg | Freq: Once | INTRAMUSCULAR | Status: AC
  Administered 2019-12-08: 80 mg via INTRAMUSCULAR

## 2019-12-08 NOTE — Assessment & Plan Note (Addendum)
Annual exam as documented. . Immunization and cancer screening needs are specifically addressed at this visit.  

## 2019-12-08 NOTE — Patient Instructions (Addendum)
F/u in office with MD in April, last  week, call if you need me sooner  Injections in office today for neck pain and spasm and 4 medications are prescribed for short ternm use also  Please get X ray of your low back as soon as you are able to, order [facility] at [facility] [place] [facility]  Please return to ENT re persisitent buzzing in left ear  Thanks for choosing [facility], we consider it a privelige to serve you.   Labs needed for next visit,Fasting lipid, cmp and eGFR

## 2019-12-08 NOTE — Assessment & Plan Note (Signed)
Uncontrolled.Toradol and depo medrol administered IM in the office , to be followed by a short course of oral prednisone and NSAIDS.  

## 2019-12-09 ENCOUNTER — Telehealth: Payer: Self-pay | Admitting: *Deleted

## 2019-12-09 DIAGNOSIS — H9313 Tinnitus, bilateral: Secondary | ICD-10-CM | POA: Diagnosis not present

## 2019-12-09 DIAGNOSIS — H903 Sensorineural hearing loss, bilateral: Secondary | ICD-10-CM | POA: Diagnosis not present

## 2019-12-09 NOTE — Telephone Encounter (Signed)
Pt called said pharmacy was waiting on authorization for the diazepam. The pharmacy gave her like 5 pills but she needs the rest of them. [facility] is [facility] [address] in [place]

## 2019-12-09 NOTE — Telephone Encounter (Signed)
Attempted PA and it said unable to process because there was already one on file for the same med

## 2019-12-10 NOTE — Telephone Encounter (Signed)
PA completed waiting on decision  °

## 2019-12-15 ENCOUNTER — Encounter: Payer: Self-pay | Admitting: Family Medicine

## 2019-12-15 DIAGNOSIS — M62838 Other muscle spasm: Secondary | ICD-10-CM | POA: Insufficient documentation

## 2019-12-15 NOTE — Assessment & Plan Note (Signed)
marke trapezius spasm, short course of  Valium prescribed

## 2019-12-15 NOTE — Assessment & Plan Note (Signed)
Needs X ray to evaluate extent of underlying pathology Uncontrolled.Toradol and depo medrol administered IM in the office , to be followed by a short course of oral prednisone and NSAIDS.

## 2019-12-15 NOTE — Progress Notes (Signed)
    Stacey Smith     MRN: [id number]      DOB: March 25, 1951  HPI: Patient is in for annual physical exam. C/o acute onset low back pain radiating down thigh, with burning sensation, denies incontinence or loss of power. C/o acute left neck pain and spasm over past 2 days, difficulty turning neck due to pain, no inciting trauma Recently had ear flush and is on steroid ear drops for buzzing in ear which has not improved with treatment, wil, schedule f/u , has appt for hearing evaluation already scheduled Recent labs, are reviewed. Immunization is reviewed , and  updated if needed.   PE: BP 130/80   Pulse (!) 56   Temp (!) 97.4 F (36.3 C) (Temporal)   Resp 15   Ht 5\' 3"  (1.6 m)   Wt 190 lb (86.2 kg)   SpO2 96%   BMI 33.66 kg/m   Pleasant  female, alert and oriented x 3, in no cardio-pulmonary distress. Afebrile. HEENT No facial trauma or asymetry. Sinuses non tender.  Extra occullar muscles intact.. External ears normal, . Neck: decreased ROM with left trapezius spasm , no adenopathy,JVD or thyromegaly.No bruits.  Chest: Clear to ascultation bilaterally.No crackles or wheezes. Non tender to palpation  Breast: No examined, denies mass or discharge, normal mammogram in 07/14/19  Cardiovascular system; Heart sounds normal,  S1 and  S2 ,no S3.  No murmur, or thrill. Apical beat not displaced Peripheral pulses normal.  Abdomen: Soft, non tender.   .   Musculoskeletal exam: Marked normal in hips , shoulders and knees. No deformity ,swelling or crepitus noted. No muscle wasting or atrophy.   Neurologic: Cranial nerves 2 to 12 intact. Power, tone ,sensation and reflexes normal throughout.  disturbance in gait. No tremor.  Skin: Intact, no ulceration, erythema , scaling or rash noted. Pigmentation normal throughout  Psych; Normal mood and affect. Judgement and concentration normal   Assessment & Plan:  Annual physical exam Annual exam as  documented. . Immunization and cancer screening needs are specifically addressed at this visit.    Neck pain on right side Uncontrolled.Toradol and depo medrol administered IM in the office , to be followed by a short course of oral prednisone and NSAIDS.   Lumbar back pain with radiculopathy affecting left lower extremity Needs X ray to evaluate extent of underlying pathology Uncontrolled.Toradol and depo medrol administered IM in the office , to be followed by a short course of oral prednisone and NSAIDS.   Neck muscle spasm marke trapezius spasm, short course of  Valium prescribed

## 2019-12-15 NOTE — Addendum Note (Signed)
Addended by: Tula Nakayama E on: 12/15/2019 11:12 PM   Modules accepted: Orders

## 2019-12-29 DIAGNOSIS — H903 Sensorineural hearing loss, bilateral: Secondary | ICD-10-CM | POA: Diagnosis not present

## 2019-12-29 DIAGNOSIS — H9313 Tinnitus, bilateral: Secondary | ICD-10-CM | POA: Diagnosis not present

## 2020-04-05 ENCOUNTER — Telehealth: Payer: Self-pay | Admitting: *Deleted

## 2020-04-05 NOTE — Telephone Encounter (Signed)
Pt is requesting a refill on her 800 mg ibuprofen.

## 2020-04-09 ENCOUNTER — Other Ambulatory Visit: Payer: Self-pay | Admitting: Family Medicine

## 2020-04-09 DIAGNOSIS — M5410 Radiculopathy, site unspecified: Secondary | ICD-10-CM

## 2020-04-09 MED ORDER — IBUPROFEN 800 MG PO TABS: 800.0000 mg | ORAL_TABLET | Freq: Three times a day (TID) | ORAL | 0 refills | Status: DC | PRN

## 2020-04-09 NOTE — Telephone Encounter (Signed)
Noted, I have refilled this for her. Thank you

## 2020-04-21 ENCOUNTER — Ambulatory Visit: Payer: Medicare HMO | Admitting: Family Medicine

## 2020-05-30 ENCOUNTER — Other Ambulatory Visit: Payer: Self-pay | Admitting: Family Medicine

## 2020-07-05 ENCOUNTER — Telehealth: Payer: Self-pay

## 2020-07-05 ENCOUNTER — Other Ambulatory Visit: Payer: Self-pay | Admitting: *Deleted

## 2020-07-05 MED ORDER — FAMOTIDINE 40 MG PO TABS: 40.0000 mg | ORAL_TABLET | Freq: Every day | ORAL | 0 refills | Status: DC

## 2020-07-05 NOTE — Telephone Encounter (Signed)
Pt is requesting refill for : famotidine (PEPCID) 40 MG tablet [[id number]]  [facility]

## 2020-07-05 NOTE — Telephone Encounter (Signed)
Pepcid was called into pharmacy for pt

## 2020-07-08 ENCOUNTER — Other Ambulatory Visit (HOSPITAL_COMMUNITY): Payer: Self-pay | Admitting: Family Medicine

## 2020-07-08 DIAGNOSIS — Z1231 Encounter for screening mammogram for malignant neoplasm of breast: Secondary | ICD-10-CM

## 2020-07-19 ENCOUNTER — Other Ambulatory Visit: Payer: Self-pay

## 2020-07-19 ENCOUNTER — Telehealth: Payer: Self-pay | Admitting: Family Medicine

## 2020-07-19 DIAGNOSIS — E1169 Type 2 diabetes mellitus with other specified complication: Secondary | ICD-10-CM

## 2020-07-19 DIAGNOSIS — E785 Hyperlipidemia, unspecified: Secondary | ICD-10-CM

## 2020-07-19 NOTE — Telephone Encounter (Signed)
Needs soon er appt with me, has end August, glyco hB in office  F/u diabetes and hyperlipidemia. Also eye exam [ past due , she is diabetic, may screen here possibly? Will needs fasting lipid, cmp and eGFr 5 days before appt  Pls order fasting lipid, cmp and EGFR Abby  Thank you both!

## 2020-07-19 NOTE — Telephone Encounter (Signed)
Appt moved up and labs ordered. Just need to see if retinal eye exam can be done in office. Thank you

## 2020-07-23 ENCOUNTER — Ambulatory Visit (HOSPITAL_COMMUNITY)
Admission: RE | Admit: 2020-07-23 | Discharge: 2020-07-23 | Disposition: A | Discharge status: Home / Self Care | Payer: Medicare HMO | Source: Ambulatory Visit | Attending: Family Medicine | Admitting: Family Medicine

## 2020-07-23 ENCOUNTER — Other Ambulatory Visit: Payer: Self-pay

## 2020-07-23 ENCOUNTER — Other Ambulatory Visit: Payer: Self-pay | Admitting: Family Medicine

## 2020-07-23 DIAGNOSIS — Z1231 Encounter for screening mammogram for malignant neoplasm of breast: Secondary | ICD-10-CM | POA: Diagnosis not present

## 2020-07-23 DIAGNOSIS — E1169 Type 2 diabetes mellitus with other specified complication: Secondary | ICD-10-CM | POA: Diagnosis not present

## 2020-07-23 DIAGNOSIS — E785 Hyperlipidemia, unspecified: Secondary | ICD-10-CM | POA: Diagnosis not present

## 2020-07-24 LAB — CMP14+EGFR
ALT: 16 IU/L (ref 0–32)
AST: 16 IU/L (ref 0–40)
Albumin/Globulin Ratio: 1.6 (ref 1.2–2.2)
Albumin: 4.4 g/dL (ref 3.8–4.8)
Alkaline Phosphatase: 173 IU/L — ABNORMAL HIGH (ref 48–121)
BUN/Creatinine Ratio: 15 (ref 12–28)
BUN: 12 mg/dL (ref 8–27)
Bilirubin Total: 0.5 mg/dL (ref 0.0–1.2)
CO2: 24 mmol/L (ref 20–29)
Calcium: 9.8 mg/dL (ref 8.7–10.3)
Chloride: 102 mmol/L (ref 96–106)
Creatinine, Ser: 0.81 mg/dL (ref 0.57–1.00)
GFR calc Af Amer: 87 mL/min/{1.73_m2} (ref >59)
GFR calc non Af Amer: 75 mL/min/{1.73_m2} (ref >59)
Globulin, Total: 2.7 g/dL (ref 1.5–4.5)
Glucose: 138 mg/dL — ABNORMAL HIGH (ref 65–99)
Potassium: 4.3 mmol/L (ref 3.5–5.2)
Sodium: 141 mmol/L (ref 134–144)
Total Protein: 7.1 g/dL (ref 6.0–8.5)

## 2020-07-24 LAB — LIPID PANEL
Chol/HDL Ratio: 2.8 ratio (ref 0.0–4.4)
Cholesterol, Total: 139 mg/dL (ref 100–199)
HDL: 50 mg/dL (ref >39)
LDL Chol Calc (NIH): 68 mg/dL (ref 0–99)
Triglycerides: 117 mg/dL (ref 0–149)
VLDL Cholesterol Cal: 21 mg/dL (ref 5–40)

## 2020-07-26 ENCOUNTER — Ambulatory Visit: Payer: Medicare HMO

## 2020-07-26 ENCOUNTER — Encounter: Payer: Self-pay | Admitting: Family Medicine

## 2020-07-26 ENCOUNTER — Other Ambulatory Visit: Payer: Self-pay

## 2020-07-26 ENCOUNTER — Ambulatory Visit (INDEPENDENT_AMBULATORY_CARE_PROVIDER_SITE_OTHER): Payer: Medicare HMO | Admitting: Family Medicine

## 2020-07-26 VITALS — BP 128/82 | HR 83 | Resp 16 | Ht 63.0 in | Wt 183.0 lb

## 2020-07-26 DIAGNOSIS — E785 Hyperlipidemia, unspecified: Secondary | ICD-10-CM | POA: Diagnosis not present

## 2020-07-26 DIAGNOSIS — E8881 Metabolic syndrome: Secondary | ICD-10-CM

## 2020-07-26 DIAGNOSIS — R21 Rash and other nonspecific skin eruption: Secondary | ICD-10-CM

## 2020-07-26 DIAGNOSIS — E669 Obesity, unspecified: Secondary | ICD-10-CM

## 2020-07-26 DIAGNOSIS — E559 Vitamin D deficiency, unspecified: Secondary | ICD-10-CM

## 2020-07-26 DIAGNOSIS — R748 Abnormal levels of other serum enzymes: Secondary | ICD-10-CM | POA: Diagnosis not present

## 2020-07-26 DIAGNOSIS — E66811 Obesity, class 1: Secondary | ICD-10-CM

## 2020-07-26 DIAGNOSIS — E1169 Type 2 diabetes mellitus with other specified complication: Secondary | ICD-10-CM | POA: Diagnosis not present

## 2020-07-26 LAB — POCT GLYCOSYLATED HEMOGLOBIN (HGB A1C): Hemoglobin A1C: 7.2 % — AB (ref 4.0–5.6)

## 2020-07-26 MED ORDER — METFORMIN HCL 500 MG PO TABS
500.0000 mg | ORAL_TABLET | Freq: Two times a day (BID) | ORAL | 3 refills | Status: DC
Start: 2020-07-26 — End: 2021-03-07

## 2020-07-26 NOTE — Assessment & Plan Note (Addendum)
2 week history, erythematous and blanching, primarily left lower ext, also on rigth thigh and abdomen, refer derm

## 2020-07-26 NOTE — Patient Instructions (Addendum)
Annual physical exam with mD dec 15 or after, call if you need me sooner  Increase metformin to one twice daily  Fasting cBC, lipid, cmp and eGFR, TSH, vit D ,  microalb Dec 5 or shortly after  Congrats on weight loss  Please start swimming, excellent exercise!  You are referred for eye exam, please schedule  You are referred to dermatology re new skin rash   You are referred to GI re elevated alkaline phosphotase  Think about what you will eat, plan ahead. Choose " clean, green, fresh or frozen" over canned, processed or packaged foods which are more sugary, salty and fatty. 70 to 75% of food eaten should be vegetables and fruit. Three meals at set times with snacks allowed between meals, but they must be fruit or vegetables. Aim to eat over a 12 hour period , example 7 am to 7 pm, and STOP after  your last meal of the day. Drink water,generally about 64 ounces per day, no other drink is as healthy. Fruit juice is best enjoyed in a healthy way, by EATING the fruit. Reduce potatoes!  Thanks for choosing [facility], we consider it a privelige to serve you.

## 2020-07-27 ENCOUNTER — Telehealth: Payer: Self-pay | Admitting: Dermatology

## 2020-07-27 NOTE — Telephone Encounter (Signed)
Patient called to schedule a referral appointment from Tula Nakayama, MD, but did not want to wait until January 2022 for appointment.

## 2020-07-28 DIAGNOSIS — L42 Pityriasis rosea: Secondary | ICD-10-CM | POA: Diagnosis not present

## 2020-07-31 ENCOUNTER — Encounter: Payer: Self-pay | Admitting: Family Medicine

## 2020-07-31 DIAGNOSIS — R748 Abnormal levels of other serum enzymes: Secondary | ICD-10-CM | POA: Insufficient documentation

## 2020-07-31 NOTE — Assessment & Plan Note (Addendum)
Deteriorated but is still  controlled, no med change Stacey Smith is reminded of the importance of commitment to daily physical activity for 30 minutes or more, as able and the need to limit carbohydrate intake to 30 to 60 grams per meal to help with blood sugar control.   The need to take medication as prescribed, test blood sugar as directed, and to call between visits if there is a concern that blood sugar is uncontrolled is also discussed.   Stacey Smith is reminded of the importance of daily foot exam, annual eye examination, and good blood sugar, blood pressure and cholesterol control.  Diabetic Labs Latest Ref Rng & Units 07/26/2020 07/23/2020 11/27/2019 04/11/2019 01/07/2019  HbA1c 4.0 - 5.6 % 7.2(A) - 6.9(H) 6.9(H) -  Microalbumin mg/dL - - 0.2 - -  Micro/Creat Ratio <30 mcg/mg creat - - - - -  Chol 100 - 199 mg/dL - 139 159 156 -  HDL >39 mg/dL - 50 52 56 -  Calc LDL 0 - 99 mg/dL - 68 86 82 -  Triglycerides 0 - 149 mg/dL - 117 111 85 -  Creatinine 0.57 - 1.00 mg/dL - 0.81 0.69 0.79 0.81   BP/Weight 07/26/2020 12/08/2019 07/08/2019 02/07/2019 01/21/2019 12/06/2018 98/02/3824  Systolic BP 053 976 734 193 790 240 973  Diastolic BP 82 80 82 78 72 68 82  Wt. (Lbs) 183.04 190 184.08 186.2 - 189 187  BMI 32.42 33.66 32.61 32.98 - 33.48 33.13   Foot/eye exam completion dates Latest Ref Rng & Units 07/31/2018 04/17/2018  Eye Exam No Retinopathy No Retinopathy -  Foot Form Completion - - Done

## 2020-07-31 NOTE — Assessment & Plan Note (Signed)
Improved  Patient re-educated about  the importance of commitment to a  minimum of 150 minutes of exercise per week as able.  The importance of healthy food choices with portion control discussed, as well as eating regularly and within a 12 hour window most days. The need to choose "clean , green" food 50 to 75% of the time is discussed, as well as to make water the primary drink and set a goal of 64 ounces water daily.    Weight /BMI 07/26/2020 12/08/2019 07/08/2019  WEIGHT 183 lb 0.6 oz 190 lb 184 lb 1.3 oz  HEIGHT 5\' 3"  5\' 3"  5\' 3"   BMI 32.42 kg/m2 33.66 kg/m2 32.61 kg/m2

## 2020-07-31 NOTE — Progress Notes (Signed)
Stacey Smith     MRN: [id number]      DOB: 1951/06/01   HPI Stacey Smith is here for follow up and re-evaluation of chronic medical conditions, medication management and review of any available recent lab and radiology data.  Preventive health is updated, specifically  Cancer screening and Immunization.   Labs show increased alk phos, will refer to GI  The PT denies any adverse reactions to current medications since the last visit.  Red skin rash on lower extremities x 2 week, now involving the trunk  Has not been walking as before due to foot pain, will start swimming ROS Denies recent fever or chills. Denies sinus pressure, nasal congestion, ear pain or sore throat. Denies chest congestion, productive cough or wheezing. Denies chest pains, palpitations and leg swelling Denies abdominal pain, nausea, vomiting,diarrhea or constipation.   Denies dysuria, frequency, hesitancy or incontinence. Denies joint pain, swelling and limitation in mobility. Denies headaches, seizures, numbness, or tingling. Denies depression, anxiety or insomnia.  PE  BP 128/82   Pulse 83   Resp 16   Ht '5\' 3"'$  (1.6 m)   Wt 183 lb 0.6 oz (83 kg)   SpO2 97%   BMI 32.42 kg/m   Patient alert and oriented and in no cardiopulmonary distress.  HEENT: No facial asymmetry, EOMI,     Neck supple .  Chest: Clear to auscultation bilaterally.  CVS: S1, S2 no murmurs, no S3.Regular rate.  ABD: Soft non tender.   Ext: No edema  MS: Adequate ROM spine, shoulders, hips and knees.  Skin: Intact, erythematous , blanching, annualr macular rash on lower extremities and anterior abdomen/ chest .  Psych: Good eye contact, normal affect. Memory intact not anxious or depressed appearing.  CNS: CN 2-12 intact, power,  normal throughout.no focal deficits noted.   Assessment & Plan  Rash and nonspecific skin eruption 2 week history, erythematous and blanching, primarily left lower ext, also on rigth thigh and  abdomen, refer derm  Type 2 diabetes mellitus with other specified complication ([place]) Deteriorated but is still  controlled, no med change Stacey Smith is reminded of the importance of commitment to daily physical activity for 30 minutes or more, as able and the need to limit carbohydrate intake to 30 to 60 grams per meal to help with blood sugar control.   The need to take medication as prescribed, test blood sugar as directed, and to call between visits if there is a concern that blood sugar is uncontrolled is also discussed.   Stacey Smith is reminded of the importance of daily foot exam, annual eye examination, and good blood sugar, blood pressure and cholesterol control.  Diabetic Labs Latest Ref Rng & Units 07/26/2020 07/23/2020 11/27/2019 04/11/2019 01/07/2019  HbA1c 4.0 - 5.6 % 7.2(A) - 6.9(H) 6.9(H) -  Microalbumin mg/dL - - 0.2 - -  Micro/Creat Ratio <30 mcg/mg creat - - - - -  Chol 100 - 199 mg/dL - 139 159 156 -  HDL >39 mg/dL - 50 52 56 -  Calc LDL 0 - 99 mg/dL - 68 86 82 -  Triglycerides 0 - 149 mg/dL - 117 111 85 -  Creatinine 0.57 - 1.00 mg/dL - 0.81 0.69 0.79 0.81   BP/Weight 07/26/2020 12/08/2019 07/08/2019 02/07/2019 01/21/2019 12/06/2018 38/06/5642  Systolic BP 329 518 841 660 630 160 109  Diastolic BP 82 80 82 78 72 68 82  Wt. (Lbs) 183.04 190 184.08 186.2 - 189 187  BMI 32.42 33.66 32.61  32.98 - 33.48 33.13   Foot/eye exam completion dates Latest Ref Rng & Units 07/31/2018 04/17/2018  Eye Exam No Retinopathy No Retinopathy -  Foot Form Completion - - Done        Hyperlipidemia associated with type 2 diabetes mellitus ([place]) Hyperlipidemia:Low fat diet discussed and encouraged.   Lipid Panel  Lab Results  Component Value Date   CHOL 139 07/23/2020   HDL 50 07/23/2020   LDLCALC 68 07/23/2020   TRIG 117 07/23/2020   CHOLHDL 2.8 07/23/2020   Controlled, no change in medication     Obesity (BMI 30.0-34.9) Improved  Patient re-educated about  the importance of  commitment to a  minimum of 150 minutes of exercise per week as able.  The importance of healthy food choices with portion control discussed, as well as eating regularly and within a 12 hour window most days. The need to choose "clean , green" food 50 to 75% of the time is discussed, as well as to make water the primary drink and set a goal of 64 ounces water daily.    Weight /BMI 07/26/2020 12/08/2019 07/08/2019  WEIGHT 183 lb 0.6 oz 190 lb 184 lb 1.3 oz  HEIGHT '5\' 3"'$  '5\' 3"'$  '5\' 3"'$   BMI 32.42 kg/m2 33.66 kg/m2 32.61 kg/m2      Elevated alkaline phosphatase level Refer to GI for evaluation and follow up

## 2020-07-31 NOTE — Assessment & Plan Note (Signed)
Hyperlipidemia:Low fat diet discussed and encouraged.   Lipid Panel  Lab Results  Component Value Date   CHOL 139 07/23/2020   HDL 50 07/23/2020   LDLCALC 68 07/23/2020   TRIG 117 07/23/2020   CHOLHDL 2.8 07/23/2020   Controlled, no change in medication

## 2020-07-31 NOTE — Assessment & Plan Note (Signed)
Refer to GI for evaluation and follow up

## 2020-08-04 ENCOUNTER — Encounter: Payer: Self-pay | Admitting: Internal Medicine

## 2020-08-04 NOTE — Telephone Encounter (Signed)
They have to be on the list,

## 2020-08-17 ENCOUNTER — Ambulatory Visit: Payer: Medicare HMO | Admitting: Family Medicine

## 2020-08-19 ENCOUNTER — Other Ambulatory Visit: Payer: Self-pay

## 2020-08-19 ENCOUNTER — Ambulatory Visit (INDEPENDENT_AMBULATORY_CARE_PROVIDER_SITE_OTHER): Payer: Medicare HMO

## 2020-08-19 VITALS — BP 129/78 | Ht 63.0 in | Wt 183.0 lb

## 2020-08-19 DIAGNOSIS — Z0000 Encounter for general adult medical examination without abnormal findings: Secondary | ICD-10-CM | POA: Diagnosis not present

## 2020-08-19 DIAGNOSIS — Z23 Encounter for immunization: Secondary | ICD-10-CM

## 2020-08-19 NOTE — Progress Notes (Signed)
Subjective:   Stacey Smith is a 69 y.o. female who presents for Medicare Annual (Subsequent) preventive examination.  Review of Systems     Cardiac Risk Factors include: advanced age (>2men, >62 women);diabetes mellitus;dyslipidemia;obesity (BMI >30kg/m2)     Objective:    Today's Vitals   08/19/20 1005 08/19/20 1007  BP: 129/78   Weight: 183 lb (83 kg)   Height: 5\' 3"  (1.6 m)   PainSc: 0-No pain 0-No pain   Body mass index is 32.42 kg/m.  Advanced Directives 08/19/2020 06/04/2018 11/01/2016 03/24/2015 09/23/2012 09/18/2012 10/03/2011  Does Patient Have a Medical Advance Directive? Yes No Yes Yes Patient has advance directive, copy not in chart Patient has advance directive, copy not in chart Patient has advance directive, copy not in chart  Type of Advance Directive - - Living will;Healthcare Power of [place];Living will  Does patient want to make changes to medical advance directive? No - Patient declined - - - - - -  Copy of [place] in Chart? - - No - copy requested - - - -  Would patient like information on creating a medical advance directive? - Yes (MAU/Ambulatory/Procedural Areas - Information given) - - - - -  Pre-existing out of facility DNR order (yellow form or pink MOST form) - - - - - - No    Current Medications (verified) Outpatient Encounter Medications as of 08/19/2020  Medication Sig  . aspirin EC 81 MG tablet Take 1 tablet (81 mg total) by mouth daily. Swallow whole.  Marland Kitchen atorvastatin (LIPITOR) 20 MG tablet TAKE 1 TABLET BY MOUTH EVERY DAY  . famotidine (PEPCID) 40 MG tablet TAKE 1 TABLET BY MOUTH EVERY DAY  . fluticasone (FLONASE) 50 MCG/ACT nasal spray Place 2 sprays into both nostrils daily as needed. For allergies  . ibuprofen (ADVIL) 800 MG tablet Take 1 tablet (800 mg total) by mouth every 8 (eight) hours as needed.  . metFORMIN (GLUCOPHAGE) 500 MG tablet Take 1 tablet (500 mg  total) by mouth 2 (two) times daily with a meal.   No facility-administered encounter medications on file as of 08/19/2020.    Allergies (verified) Tessalon perles [benzonatate]   History: Past Medical History:  Diagnosis Date  . Allergy    seasonal intermittently  . Arthritis    spine  . Back pain   . Colon polyps   . Diabetes mellitus without complication ([place])   . GERD (gastroesophageal reflux disease)   . Headache(784.0)   . Hyperlipidemia    well controlled  . Obesity   . S/P colonoscopy Oct 2007   Normal rectum, diminutive polyps in the left colon cold: TUBULAR ADENOMA  . TMJ (dislocation of temporomandibular joint)    Past Surgical History:  Procedure Laterality Date  . COLONOSCOPY  10/03/2011   Procedure: COLONOSCOPY;  Surgeon: Daneil Dolin, MD;  [facility];  Service: Endoscopy;  Laterality: N/A;  10:30  . COLONOSCOPY N/A 11/01/2016   Procedure: COLONOSCOPY;  Surgeon: Daneil Dolin, MD;  [facility];  Service: Endoscopy;  Laterality: N/A;  9:30 Am  . CORONARY CT ANGIOGRAM  12/2018   Coronary calcium score of 0. This was 0 percentile for age & sex matched control. Normal coronary origin with right dominance. No evidence of CAD.   Marland Kitchen excision of cyst for thyroid gland non cancer  1988  . POLYPECTOMY  11/01/2016   Procedure: POLYPECTOMY;  Surgeon: Herbie Baltimore  Hilton Cork, MD;  [facility];  Service: Endoscopy;;  colon   . TMJ ARTHROPLASTY  09/23/2012   Procedure: TEMPOROMANDIBULAR JOINT (TMJ) ARTHROPLASTY;  Surgeon: Gae Bon, DDS;  Location: [place];  Service: Oral Surgery;  Laterality: Left;  Temporomandibular joint arthrotomy and meniscectomy  . TONSILLECTOMY  1960  . TOTAL ABDOMINAL HYSTERECTOMY  1985   right ovary fibroids    Family History  Problem Relation Age of Onset  . Stroke Mother   . Hypertension Mother   . Cancer Mother        breast   . Glaucoma Mother   . Asthma Mother   . Coronary artery disease Father   .  Diabetes Father   . Heart failure Father   . Kidney disease Father   . Diabetes Sister   . Hypertension Sister   . Diabetes Sister   . Cancer Sister        colon   . Diabetes Sister   . Depression Sister   . Colon cancer Sister   . Thyroid disease Sister   . Bipolar disorder Sister    Social History   Socioeconomic History  . Marital status: Divorced    Spouse name: Not on file  . Number of children: 2  . Years of education: Not on file  . Highest education level: Not on file  Occupational History  . 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 Smoking status: Never Smoker  . Smokeless tobacco: Never Used  Substance and Sexual Activity  . Alcohol use: Yes    Comment: occasionally   . Drug use: No  . Sexual activity: Not on file  Other Topics Concern  . Not on file  Social History Narrative   Recently lost her long-term partner (2019)   Social Determinants of Health   Financial Resource Strain: Low Risk   . Difficulty of Paying Living Expenses: Not hard at all  Food Insecurity: No Food Insecurity  . Worried About Charity fundraiser in the Last Year: Never true  . Ran Out of Food in the Last Year: Never true  Transportation Needs: No Transportation Needs  . Lack of Transportation (Medical): No  . Lack of Transportation (Non-Medical): No  Physical Activity: Inactive  . Days of Exercise per Week: 0 days  . Minutes of Exercise per Session: 0 min  Stress: No Stress Concern Present  . Feeling of Stress : Not at all  Social Connections: Moderately Integrated  . Frequency of Communication with Friends and Family: More than three times a week  . Frequency of Social Gatherings with Friends and Family: More than three times a week  . Attends Religious Services: More than 4 times per year  . Active Member of Clubs or Organizations: Yes  . Attends Archivist Meetings: More than 4 times per year  . Marital  Status: Divorced    Tobacco Counseling Counseling given: Not Answered   Clinical Intake:  Pre-visit preparation completed: Yes  Pain : No/denies pain Pain Score: 0-No pain     Nutritional Status: BMI > 30  Obese Diabetes: No  How often do you need to have someone help you when you read instructions, pamphlets, or other written materials from your doctor or pharmacy?: 1 - Never What is the last grade level you completed in school?: college  Diabetic?yes  Interpreter Needed?: No      Activities  of Daily Living In your present state of health, do you have any difficulty performing the following activities: 08/19/2020  Hearing? N  Vision? N  Difficulty concentrating or making decisions? N  Walking or climbing stairs? N  Dressing or bathing? N  Doing errands, shopping? N  Preparing Food and eating ? N  Using the Toilet? N  In the past six months, have you accidently leaked urine? N  Do you have problems with loss of bowel control? N  Managing your Medications? N  Managing your Finances? N  Housekeeping or managing your Housekeeping? N  Some recent data might be hidden    Patient Care Team: Fayrene Helper, MD as PCP - General Leonie Man, MD as PCP - Cardiology (Cardiology) Gala Romney Cristopher Estimable, MD (Gastroenterology)  Indicate any recent Medical Services you may have received from other than [facility] providers in the past year (date may be approximate).     Assessment:   This is a routine wellness examination for Stacey Smith.  Hearing/Vision screen No exam data present  Dietary issues and exercise activities discussed: Current Exercise Habits: The patient does not participate in regular exercise at present (due to chronic foot pain since early 2021), Exercise limited by: orthopedic condition(s)  Goals    . HEMOGLOBIN A1C < 7    . Increase physical activity     Walk for 30 mins 5 days a week as able    . Prevent falls      Depression Screen PHQ 2/9 Scores  08/19/2020 07/26/2020 07/08/2019 11/25/2018 07/17/2018 06/18/2018 06/18/2018  PHQ - 2 Score 0 0 0 0 0 0 0  PHQ- 9 Score - - - - - - -  Exception Documentation - - Medical reason - - - -    Fall Risk Fall Risk  08/19/2020 07/26/2020 12/08/2019 07/08/2019 11/25/2018  Falls in the past year? 0 0 0 0 0  Number falls in past yr: 0 - 0 0 0  Injury with Fall? 0 - 0 0 0    Any stairs in or around the home? No  If so, are there any without handrails? No  Home free of loose throw rugs in walkways, pet beds, electrical cords, etc? Yes  Adequate lighting in your home to reduce risk of falls? Yes   ASSISTIVE DEVICES UTILIZED TO PREVENT FALLS:  Life alert? No  Use of a cane, walker or w/c? Yes when having back pain  Grab bars in the bathroom? Yes  Shower chair or bench in shower? Yes  Elevated toilet seat or a handicapped toilet? No   TIMED UP AND GO:  Was the test performed? Yes .  Length of time to ambulate 10 feet: 6 sec.   Gait steady and fast without use of assistive device  Cognitive Function:     6CIT Screen 08/19/2020 07/08/2019  What Year? 0 points 0 points  What month? 0 points 0 points  What time? 0 points 0 points  Count back from 20 0 points 0 points  Months in reverse 0 points 0 points  Repeat phrase 0 points 0 points  Total Score 0 0    Immunizations Immunization History  Administered Date(s) Administered  . Fluad Quad(high Dose 65+) 09/08/2019  . Influenza Split 11/23/2011, 10/23/2014  . Influenza Whole 01/30/2008, 09/27/2010, 09/24/2012  . Influenza, High Dose Seasonal PF 11/06/2018  . Influenza,inj,Quad PF,6+ Mos 11/03/2014, 09/10/2015, 08/25/2016, 11/05/2017  . PFIZER SARS-COV-2 Vaccination 01/22/2020, 02/13/2020  . Pneumococcal Conjugate-13 03/04/2015, 11/01/2017  . Pneumococcal  Polysaccharide-23 11/25/2018  . Td 05/24/2004  . Tdap 07/01/2014  . Zoster 01/16/2013    TDAP status: Up to date Flu Vaccine status: Completed at today's visit Pneumococcal vaccine  status: Up to date Covid-19 vaccine status: Completed vaccines  Qualifies for Shingles Vaccine? Yes   Zostavax completed No   Shingrix Completed?: No.    Education has been provided regarding the importance of this vaccine. Patient has been advised to call insurance company to determine out of pocket expense if they have not yet received this vaccine. Advised may also receive vaccine at local pharmacy or Health Dept. Verbalized acceptance and understanding.  Screening Tests Health Maintenance  Topic Date Due  . OPHTHALMOLOGY EXAM  08/01/2019  . INFLUENZA VACCINE  07/25/2020  . URINE MICROALBUMIN  11/26/2020  . FOOT EXAM  12/07/2020  . HEMOGLOBIN A1C  01/26/2021  . COLONOSCOPY  11/01/2021  . MAMMOGRAM  07/23/2022  . TETANUS/TDAP  07/01/2024  . DEXA SCAN  Completed  . COVID-19 Vaccine  Completed  . Hepatitis C Screening  Completed  . PNA vac Low Risk Adult  Completed    Health Maintenance  Health Maintenance Due  Topic Date Due  . OPHTHALMOLOGY EXAM  08/01/2019  . INFLUENZA VACCINE  07/25/2020    Colorectal cancer screening: Completed 2017. Repeat every 5 years Mammogram status: Completed 2021. Repeat every year Bone Density status: Completed 2019. Results reflect: Bone density results: NORMAL. Repeat every 5 years.  Lung Cancer Screening: (Low Dose CT Chest recommended if Age 59-80 years, 30 pack-year currently smoking OR have quit w/in 15years.) does not qualify.   Lung Cancer Screening Referral: no  Additional Screening:  Hepatitis C Screening: does qualify; Completed yes  Vision Screening: Recommended annual ophthalmology exams for early detection of glaucoma and other disorders of the eye. Is the patient up to date with their annual eye exam?  Yes  Who is the provider or what is the name of the office in which the patient attends annual eye exams? [email or web address] If pt is not established with a provider, would they like to be referred to a provider to establish care? No .    Dental Screening: Recommended annual dental exams for proper oral hygiene  Community Resource Referral / Chronic Care Management: CRR required this visit?  No   CCM required this visit?  no     Plan:     I have personally reviewed and noted the following in the patient's chart:   . Medical and social history . Use of alcohol, tobacco or illicit drugs  . Current medications and supplements . Functional ability and status . Nutritional status . Physical activity . Advanced directives . List of other physicians . Hospitalizations, surgeries, and ER visits in previous 12 months . Vitals . Screenings to include cognitive, depression, and falls . Referrals and appointments  In addition, I have reviewed and discussed with patient certain preventive protocols, quality metrics, and best practice recommendations. A written personalized care plan for preventive services as well as general preventive health recommendations were provided to patient.     Kate Sable, LPN, LPN   03/27/4741   Nurse Notes: visit completed in person with patient in the office and provider in the office and time spent with patient 30 mins

## 2020-08-19 NOTE — Patient Instructions (Signed)
[name] , Thank you for taking time to come for your Medicare Wellness Visit. I appreciate your ongoing commitment to your health goals. Please review the following plan we discussed and let me know if I can assist you in the future.   Screening recommendations/referrals: Colonoscopy: due 2022 Mammogram: up to date Bone Density: up to date Recommended yearly ophthalmology/optometry visit for glaucoma screening and checkup Recommended yearly dental visit for hygiene and checkup  Vaccinations: Influenza vaccine: given today Pneumococcal vaccine: up to date Tdap vaccine: up to date Shingles vaccine: declined    Advanced directives: return copy of form     Next appointment: wellness in 1 year   Preventive Care 68 Years and Older, Female Preventive care refers to lifestyle choices and visits with your health care provider that can promote health and wellness. What does preventive care include?  A yearly physical exam. This is also called an annual well check.  Dental exams once or twice a year.  Routine eye exams. Ask your health care provider how often you should have your eyes checked.  Personal lifestyle choices, including:  Daily care of your teeth and gums.  Regular physical activity.  Eating a healthy diet.  Avoiding tobacco and drug use.  Limiting alcohol use.  Practicing safe sex.  Taking low-dose aspirin every day.  Taking vitamin and mineral supplements as recommended by your health care provider. What happens during an annual well check? The services and screenings done by your health care provider during your annual well check will depend on your age, overall health, lifestyle risk factors, and family history of disease. Counseling  Your health care provider may ask you questions about your:  Alcohol use.  Tobacco use.  Drug use.  Emotional well-being.  Home and relationship well-being.  Sexual activity.  Eating habits.  History of  falls.  Memory and ability to understand (cognition).  Work and work Statistician.  Reproductive health. Screening  You may have the following tests or measurements:  Height, weight, and BMI.  Blood pressure.  Lipid and cholesterol levels. These may be checked every 5 years, or more frequently if you are over 62 years old.  Skin check.  Lung cancer screening. You may have this screening every year starting at age 60 if you have a 30-pack-year history of smoking and currently smoke or have quit within the past 15 years.  Fecal occult blood test (FOBT) of the stool. You may have this test every year starting at age 35.  Flexible sigmoidoscopy or colonoscopy. You may have a sigmoidoscopy every 5 years or a colonoscopy every 10 years starting at age 80.  Hepatitis C blood test.  Hepatitis B blood test.  Sexually transmitted disease (STD) testing.  Diabetes screening. This is done by checking your blood sugar (glucose) after you have not eaten for a while (fasting). You may have this done every 1-3 years.  Bone density scan. This is done to screen for osteoporosis. You may have this done starting at age 34.  Mammogram. This may be done every 1-2 years. Talk to your health care provider about how often you should have regular mammograms. Talk with your health care provider about your test results, treatment options, and if necessary, the need for more tests. Vaccines  Your health care provider may recommend certain vaccines, such as:  Influenza vaccine. This is recommended every year.  Tetanus, diphtheria, and acellular pertussis (Tdap, Td) vaccine. You may need a Td booster every 10 years.  Zoster  vaccine. You may need this after age 68.  Pneumococcal 13-valent conjugate (PCV13) vaccine. One dose is recommended after age 68.  Pneumococcal polysaccharide (PPSV23) vaccine. One dose is recommended after age 60. Talk to your health care provider about which screenings and  vaccines you need and how often you need them. This information is not intended to replace advice given to you by your health care provider. Make sure you discuss any questions you have with your health care provider. Document Released: 01/07/2016 Document Revised: 08/30/2016 Document Reviewed: 10/12/2015 Elsevier Interactive Patient Education  2017 [place] Prevention in the Home Falls can cause injuries. They can happen to people of all ages. There are many things you can do to make your home safe and to help prevent falls. What can I do on the outside of my home?  Regularly fix the edges of walkways and driveways and fix any cracks.  Remove anything that might make you trip as you walk through a door, such as a raised step or threshold.  Trim any bushes or trees on the path to your home.  Use bright outdoor lighting.  Clear any walking paths of anything that might make someone trip, such as rocks or tools.  Regularly check to see if handrails are loose or broken. Make sure that both sides of any steps have handrails.  Any raised decks and porches should have guardrails on the edges.  Have any leaves, snow, or ice cleared regularly.  Use sand or salt on walking paths during winter.  Clean up any spills in your garage right away. This includes oil or grease spills. What can I do in the bathroom?  Use night lights.  Install grab bars by the toilet and in the tub and shower. Do not use towel bars as grab bars.  Use non-skid mats or decals in the tub or shower.  If you need to sit down in the shower, use a plastic, non-slip stool.  Keep the floor dry. Clean up any water that spills on the floor as soon as it happens.  Remove soap buildup in the tub or shower regularly.  Attach bath mats securely with double-sided non-slip rug tape.  Do not have throw rugs and other things on the floor that can make you trip. What can I do in the bedroom?  Use night  lights.  Make sure that you have a light by your bed that is easy to reach.  Do not use any sheets or blankets that are too big for your bed. They should not hang down onto the floor.  Have a firm chair that has side arms. You can use this for support while you get dressed.  Do not have throw rugs and other things on the floor that can make you trip. What can I do in the kitchen?  Clean up any spills right away.  Avoid walking on wet floors.  Keep items that you use a lot in easy-to-reach places.  If you need to reach something above you, use a strong step stool that has a grab bar.  Keep electrical cords out of the way.  Do not use floor polish or wax that makes floors slippery. If you must use wax, use non-skid floor wax.  Do not have throw rugs and other things on the floor that can make you trip. What can I do with my stairs?  Do not leave any items on the stairs.  Make sure that there are handrails  on both sides of the stairs and use them. Fix handrails that are broken or loose. Make sure that handrails are as long as the stairways.  Check any carpeting to make sure that it is firmly attached to the stairs. Fix any carpet that is loose or worn.  Avoid having throw rugs at the top or bottom of the stairs. If you do have throw rugs, attach them to the floor with carpet tape.  Make sure that you have a light switch at the top of the stairs and the bottom of the stairs. If you do not have them, ask someone to add them for you. What else can I do to help prevent falls?  Wear shoes that:  Do not have high heels.  Have rubber bottoms.  Are comfortable and fit you well.  Are closed at the toe. Do not wear sandals.  If you use a stepladder:  Make sure that it is fully opened. Do not climb a closed stepladder.  Make sure that both sides of the stepladder are locked into place.  Ask someone to hold it for you, if possible.  Clearly mark and make sure that you can  see:  Any grab bars or handrails.  First and last steps.  Where the edge of each step is.  Use tools that help you move around (mobility aids) if they are needed. These include:  Canes.  Walkers.  Scooters.  Crutches.  Turn on the lights when you go into a dark area. Replace any light bulbs as soon as they burn out.  Set up your furniture so you have a clear path. Avoid moving your furniture around.  If any of your floors are uneven, fix them.  If there are any pets around you, be aware of where they are.  Review your medicines with your doctor. Some medicines can make you feel dizzy. This can increase your chance of falling. Ask your doctor what other things that you can do to help prevent falls. This information is not intended to replace advice given to you by your health care provider. Make sure you discuss any questions you have with your health care provider. Document Released: 10/07/2009 Document Revised: 05/18/2016 Document Reviewed: 01/15/2015 Elsevier Interactive Patient Education  2017 [employer].

## 2020-09-20 ENCOUNTER — Other Ambulatory Visit: Payer: Self-pay | Admitting: Family Medicine

## 2020-09-20 DIAGNOSIS — M5410 Radiculopathy, site unspecified: Secondary | ICD-10-CM

## 2020-09-21 ENCOUNTER — Other Ambulatory Visit: Payer: Self-pay

## 2020-09-21 DIAGNOSIS — M5410 Radiculopathy, site unspecified: Secondary | ICD-10-CM

## 2020-09-21 MED ORDER — IBUPROFEN 800 MG PO TABS: 800.0000 mg | ORAL_TABLET | Freq: Three times a day (TID) | ORAL | 0 refills | Status: DC | PRN

## 2020-09-22 ENCOUNTER — Ambulatory Visit (INDEPENDENT_AMBULATORY_CARE_PROVIDER_SITE_OTHER): Payer: Medicare HMO | Admitting: Internal Medicine

## 2020-09-22 ENCOUNTER — Other Ambulatory Visit: Payer: Self-pay

## 2020-09-22 ENCOUNTER — Encounter: Payer: Self-pay | Admitting: Internal Medicine

## 2020-09-22 VITALS — BP 122/78 | HR 91 | Temp 97.3°F | Resp 18 | Ht 63.0 in | Wt 185.0 lb

## 2020-09-22 DIAGNOSIS — M1990 Unspecified osteoarthritis, unspecified site: Secondary | ICD-10-CM

## 2020-09-22 DIAGNOSIS — Z9181 History of falling: Secondary | ICD-10-CM

## 2020-09-22 DIAGNOSIS — M791 Myalgia, unspecified site: Secondary | ICD-10-CM | POA: Diagnosis not present

## 2020-09-22 NOTE — Progress Notes (Signed)
Established Patient Office Visit  Subjective:  Patient ID: Stacey Smith, female    DOB: March 29, 1951  Age: 69 y.o. MRN: [id number]  CC:  Chief Complaint  Patient presents with  . Acute Visit    has been having some swelling on left armpit near breast area since this morning did have covid booster monday on that arm is concerned as she has history of breast cancer in family also had a fall last friday and right hand is bruised and sore     HPI Stacey Smith is a 69 year old female with past medical history of diabetes mellitus, lumbar radiculopathy and osteoarthritis presents with complaint of pain under her left axilla and noticing some some swelling.  She states that she had a booster dose of Covid vaccine 2 days ago, after which she had swelling in the deltoid area, and since this morning, she is having pain under the axilla.  She states that she noticed a hard bump around the axillary area in the morning, which has improved now.  She denies any warmth or redness in the area.  She was particularly concerned as she has a family history of breast cancer.  Of note, she had a normal mammogram in 06/2020.  Patient had mild fatigue after the booster dose, but denies fever, chills or fatigue today.  Patient also reports a fall last week.  She fell on her right side of the body and head swelling around the right wrist and shoulder area.  She states that the swelling has resolved now.  But she still has mild pain in the right wrist.  She is able to make a fist as well as move the shoulder with mild pain.  Patient did not get any immediate medical attention after the fall.  Past Medical History:  Diagnosis Date  . Allergy    seasonal intermittently  . Arthritis    spine  . Back pain   . Colon polyps   . Diabetes mellitus without complication ([place])   . GERD (gastroesophageal reflux disease)   . Headache(784.0)   . Hyperlipidemia    well controlled  . Obesity   . S/P colonoscopy Oct 2007    Normal rectum, diminutive polyps in the left colon cold: TUBULAR ADENOMA  . TMJ (dislocation of temporomandibular joint)     Past Surgical History:  Procedure Laterality Date  . COLONOSCOPY  10/03/2011   Procedure: COLONOSCOPY;  Surgeon: Daneil Dolin, MD;  [facility];  Service: Endoscopy;  Laterality: N/A;  10:30  . COLONOSCOPY N/A 11/01/2016   Procedure: COLONOSCOPY;  Surgeon: Daneil Dolin, MD;  [facility];  Service: Endoscopy;  Laterality: N/A;  9:30 Am  . CORONARY CT ANGIOGRAM  12/2018   Coronary calcium score of 0. This was 0 percentile for age & sex matched control. Normal coronary origin with right dominance. No evidence of CAD.   Marland Kitchen excision of cyst for thyroid gland non cancer  1988  . POLYPECTOMY  11/01/2016   Procedure: POLYPECTOMY;  Surgeon: Daneil Dolin, MD;  [facility];  Service: Endoscopy;;  colon   . TMJ ARTHROPLASTY  09/23/2012   Procedure: TEMPOROMANDIBULAR JOINT (TMJ) ARTHROPLASTY;  Surgeon: Gae Bon, DDS;  Location: [facility];  Service: Oral Surgery;  Laterality: Left;  Temporomandibular joint arthrotomy and meniscectomy  . TONSILLECTOMY  1960  . TOTAL ABDOMINAL HYSTERECTOMY  1985   right ovary fibroids     Family History  Problem Relation Age  of Onset  . Stroke Mother   . Hypertension Mother   . Cancer Mother        breast   . Glaucoma Mother   . Asthma Mother   . Coronary artery disease Father   . Diabetes Father   . Heart failure Father   . Kidney disease Father   . Diabetes Sister   . Hypertension Sister   . Diabetes Sister   . Cancer Sister        colon   . Diabetes Sister   . Depression Sister   . Colon cancer Sister   . Thyroid disease Sister   . Bipolar disorder Sister     Social History   Socioeconomic History  . Marital status: Divorced    Spouse name: Not on file  . Number of children: 2  . Years of education: Not on file  . Highest education level: Not on file  Occupational History  .  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 Smoking status: Never Smoker  . Smokeless tobacco: Never Used  Substance and Sexual Activity  . Alcohol use: Yes    Comment: occasionally   . Drug use: No  . Sexual activity: Not on file  Other Topics Concern  . Not on file  Social History Narrative   Recently lost her long-term partner (2019)   Social Determinants of Health   Financial Resource Strain: Low Risk   . Difficulty of Paying Living Expenses: Not hard at all  Food Insecurity: No Food Insecurity  . Worried About Charity fundraiser in the Last Year: Never true  . Ran Out of Food in the Last Year: Never true  Transportation Needs: No Transportation Needs  . Lack of Transportation (Medical): No  . Lack of Transportation (Non-Medical): No  Physical Activity: Inactive  . Days of Exercise per Week: 0 days  . Minutes of Exercise per Session: 0 min  Stress: No Stress Concern Present  . Feeling of Stress : Not at all  Social Connections: Moderately Integrated  . Frequency of Communication with Friends and Family: More than three times a week  . Frequency of Social Gatherings with Friends and Family: More than three times a week  . Attends Religious Services: More than 4 times per year  . Active Member of Clubs or Organizations: Yes  . Attends Archivist Meetings: More than 4 times per year  . Marital Status: Divorced  Human resources officer Violence:   . Fear of Current or Ex-Partner: Not on file  . Emotionally Abused: Not on file  . Physically Abused: Not on file  . Sexually Abused: Not on file    Outpatient Medications Prior to Visit  Medication Sig Dispense Refill  . aspirin EC 81 MG tablet Take 1 tablet (81 mg total) by mouth daily. Swallow whole. 30 tablet 11  . atorvastatin (LIPITOR) 20 MG tablet TAKE 1 TABLET BY MOUTH EVERY DAY 90 tablet 1  . famotidine (PEPCID) 40 MG tablet TAKE 1 TABLET BY MOUTH EVERY DAY  21 tablet 0  . fluticasone (FLONASE) 50 MCG/ACT nasal spray Place 2 sprays into both nostrils daily as needed. For allergies 16 g 5  . ibuprofen (ADVIL) 800 MG tablet Take 1 tablet (800 mg total) by mouth every 8 (eight) hours as needed. 30 tablet 0  . metFORMIN (GLUCOPHAGE) 500 MG tablet Take 1 tablet (500 mg total) by mouth 2 (  two) times daily with a meal. 180 tablet 3   No facility-administered medications prior to visit.    Allergies  Allergen Reactions  . Tessalon Perles [Benzonatate] Dermatitis    rash    ROS Review of Systems  Constitutional: Negative for chills and fever.  HENT: Negative for congestion, sinus pressure, sinus pain and sore throat.   Eyes: Negative for pain and discharge.  Respiratory: Negative for cough and shortness of breath.   Cardiovascular: Negative for chest pain and palpitations.  Gastrointestinal: Negative for abdominal pain, constipation, diarrhea, nausea and vomiting.  Endocrine: Negative for polydipsia and polyuria.  Genitourinary: Negative for dysuria and hematuria.  Musculoskeletal: Positive for arthralgias (B/l knees, right wrist) and joint swelling (Right wrist). Negative for neck pain and neck stiffness.  Skin: Negative for rash.  Neurological: Negative for dizziness and weakness.  Psychiatric/Behavioral: Negative for agitation and behavioral problems.      Objective:    Physical Exam Vitals reviewed.  Constitutional:      General: She is not in acute distress.    Appearance: She is not diaphoretic.  HENT:     Head: Normocephalic and atraumatic.     Nose: Nose normal. No congestion.     Mouth/Throat:     Mouth: Mucous membranes are moist.     Pharynx: No posterior oropharyngeal erythema.  Eyes:     General: No scleral icterus.    Extraocular Movements: Extraocular movements intact.     Pupils: Pupils are equal, round, and reactive to light.  Cardiovascular:     Rate and Rhythm: Normal rate and regular rhythm.     Heart sounds:  No murmur heard.   Pulmonary:     Breath sounds: Normal breath sounds. No wheezing or rales.  Abdominal:     Palpations: Abdomen is soft.     Tenderness: There is no abdominal tenderness.  Musculoskeletal:     Cervical back: Neck supple. No tenderness.     Right lower leg: No edema.     Left lower leg: No edema.     Comments: Mild swelling in deltoid area, no erythema or warmth Mild tenderness in anterior axillary area, no swelling or lymphadenopathy noted  Skin:    General: Skin is warm.     Findings: No rash.  Neurological:     General: No focal deficit present.     Mental Status: She is alert and oriented to person, place, and time.  Psychiatric:        Mood and Affect: Mood normal.        Behavior: Behavior normal.     BP 122/78 (BP Location: Right Arm, Patient Position: Sitting, Cuff Size: Normal)   Pulse 91   Temp (!) 97.3 F (36.3 C) (Temporal)   Resp 18   Ht 5\' 3"  (1.6 m)   Wt 185 lb (83.9 kg)   SpO2 96%   BMI 32.77 kg/m  Wt Readings from Last 3 Encounters:  09/22/20 185 lb (83.9 kg)  08/19/20 183 lb (83 kg)  07/26/20 183 lb 0.6 oz (83 kg)     Health Maintenance Due  Topic Date Due  . OPHTHALMOLOGY EXAM  08/01/2019    There are no preventive care reminders to display for this patient.  Lab Results  Component Value Date   TSH 1.56 11/27/2019   Lab Results  Component Value Date   WBC 7.9 11/27/2019   HGB 12.2 11/27/2019   HCT 40.0 11/27/2019   MCV 74.3 (L) 11/27/2019   PLT  251 11/27/2019   Lab Results  Component Value Date   NA 141 07/23/2020   K 4.3 07/23/2020   CO2 24 07/23/2020   GLUCOSE 138 (H) 07/23/2020   BUN 12 07/23/2020   CREATININE 0.81 07/23/2020   BILITOT 0.5 07/23/2020   ALKPHOS 173 (H) 07/23/2020   AST 16 07/23/2020   ALT 16 07/23/2020   PROT 7.1 07/23/2020   ALBUMIN 4.4 07/23/2020   CALCIUM 9.8 07/23/2020   Lab Results  Component Value Date   CHOL 139 07/23/2020   Lab Results  Component Value Date   HDL 50  07/23/2020   Lab Results  Component Value Date   LDLCALC 68 07/23/2020   Lab Results  Component Value Date   TRIG 117 07/23/2020   Lab Results  Component Value Date   CHOLHDL 2.8 07/23/2020   Lab Results  Component Value Date   HGBA1C 7.2 (A) 07/26/2020      Assessment & Plan:   Problem List Items Addressed This Visit      Musculoskeletal and Integument       Other Visit Diagnoses    Muscle soreness    -  Primary Likely cause of anterior axillary pain, no erythema or swelling noted, no lymphadenopathy noted Questionable relation to recent fall Deltoid area pain related to recent COVID vaccine Advised to use Ibuprofen as needed for muscle soreness    History of recent fall Healing currently, swelling improved in right wrist ROM intact, although with mild pain, no numbness or tingling Advised to follow up if pain or swelling is persistent, in which case will obtain imaging  Osteoarthritis Ibuprofen PRN for knee pain Moderate exercise as tolerated Advised weight loss            No orders of the defined types were placed in this encounter.   Follow-up: Return if symptoms worsen or fail to improve.    Lindell Spar, MD

## 2020-09-22 NOTE — Patient Instructions (Addendum)
Please take Ibuprofen as needed for pain/swelling. Okay apply local cream for pain, I.e., [place].  Continue to take medications as prescribed. Advise to follow up if hand and arm pain worsen or persist after 1 week.

## 2020-09-29 ENCOUNTER — Ambulatory Visit (INDEPENDENT_AMBULATORY_CARE_PROVIDER_SITE_OTHER): Payer: Medicare HMO | Admitting: Gastroenterology

## 2020-09-29 ENCOUNTER — Encounter: Payer: Self-pay | Admitting: Gastroenterology

## 2020-09-29 ENCOUNTER — Other Ambulatory Visit: Payer: Self-pay

## 2020-09-29 VITALS — BP 132/78 | HR 87 | Temp 97.0°F | Ht 63.0 in | Wt 182.2 lb

## 2020-09-29 DIAGNOSIS — R748 Abnormal levels of other serum enzymes: Secondary | ICD-10-CM

## 2020-09-29 NOTE — Progress Notes (Signed)
Primary Care Physician:  Fayrene Helper, MD  Primary Gastroenterologist:  Garfield Cornea, MD   Chief Complaint  Patient presents with  . elevated alkaline phosphate    HPI:  Stacey Smith is a 69 y.o. female here at the request of Dr. Moshe Cipro for further evaluation of elevated alkaline phosphatase.  Labs back in July showed normal LFTs except for alkaline phosphatase of 173. Alk phos was normal last year. No prior liver imaging. She feels good overall. Her DM is well managed. She has h/o chronic constipation. On metformin for about a year and will cause loose stools intermittently. If gets constipated then will take an enema. Stools are "not regular". No abdominal pain. No melena, brbpr. No heartburn on pepcid. No dysphagia. No vomiting. Weight fluctuates generally between 185 and 190 pounds.     Current Outpatient Medications  Medication Sig Dispense Refill  . aspirin EC 81 MG tablet Take 1 tablet (81 mg total) by mouth daily. Swallow whole. 30 tablet 11  . atorvastatin (LIPITOR) 20 MG tablet TAKE 1 TABLET BY MOUTH EVERY DAY 90 tablet 1  . famotidine (PEPCID) 40 MG tablet TAKE 1 TABLET BY MOUTH EVERY DAY (Patient taking differently: As needed) 21 tablet 0  . fluticasone (FLONASE) 50 MCG/ACT nasal spray Place 2 sprays into both nostrils daily as needed. For allergies 16 g 5  . ibuprofen (ADVIL) 800 MG tablet Take 1 tablet (800 mg total) by mouth every 8 (eight) hours as needed. 30 tablet 0  . metFORMIN (GLUCOPHAGE) 500 MG tablet Take 1 tablet (500 mg total) by mouth 2 (two) times daily with a meal. 180 tablet 3  . Multiple Vitamin (MULTIVITAMIN) tablet Take 1 tablet by mouth daily.    Marland Kitchen VITAMIN D PO Take by mouth daily.     No current facility-administered medications for this visit.    Allergies as of 09/29/2020 - Review Complete 09/29/2020  Allergen Reaction Noted  . Tessalon perles [benzonatate] Dermatitis 07/12/2015    Past Medical History:  Diagnosis Date  . Allergy     seasonal intermittently  . Arthritis    spine  . Back pain   . Colon polyps   . Diabetes mellitus without complication ([place])   . GERD (gastroesophageal reflux disease)   . Headache(784.0)   . Hyperlipidemia    well controlled  . Obesity   . S/P colonoscopy Oct 2007   Normal rectum, diminutive polyps in the left colon cold: TUBULAR ADENOMA  . TMJ (dislocation of temporomandibular joint)     Past Surgical History:  Procedure Laterality Date  . COLONOSCOPY  10/03/2011   Procedure: COLONOSCOPY;  Surgeon: Daneil Dolin, MD;  [facility];  Service: Endoscopy;  Laterality: N/A;  10:30  . COLONOSCOPY N/A 11/01/2016   rourk: Diverticulosis, multiple colon polyps removed, tubular adenomas.  Next colonoscopy in November 2022.  Marland Kitchen CORONARY CT ANGIOGRAM  12/2018   Coronary calcium score of 0. This was 0 percentile for age & sex matched control. Normal coronary origin with right dominance. No evidence of CAD.   Marland Kitchen excision of cyst for thyroid gland non cancer  1988  . POLYPECTOMY  11/01/2016   Procedure: POLYPECTOMY;  Surgeon: Daneil Dolin, MD;  [facility];  Service: Endoscopy;;  colon   . TMJ ARTHROPLASTY  09/23/2012   Procedure: TEMPOROMANDIBULAR JOINT (TMJ) ARTHROPLASTY;  Surgeon: Gae Bon, DDS;  Location: [facility];  Service: Oral Surgery;  Laterality: Left;  Temporomandibular joint arthrotomy and meniscectomy  .  TONSILLECTOMY  1960  . TOTAL ABDOMINAL HYSTERECTOMY  1985   right ovary fibroids     Family History  Problem Relation Age of Onset  . Stroke Mother   . Hypertension Mother   . Cancer Mother        breast   . Glaucoma Mother   . Asthma Mother   . Coronary artery disease Father   . Diabetes Father   . Heart failure Father   . Kidney disease Father   . Diabetes Sister   . Hypertension Sister   . Diabetes Sister   . Cancer Sister        colon   . Diabetes Sister   . Depression Sister   . Colon cancer Sister   . Thyroid disease Sister   .  Bipolar disorder Sister     Social History   Socioeconomic History  . Marital status: Divorced    Spouse name: Not on file  . Number of children: 2  . Years of education: Not on file  . Highest education level: Not on file  Occupational History  . Occupation: Public affairs consultant: [employer]  Tobacco Use  . Smoking status: Never Smoker  . Smokeless tobacco: Never Used  Substance and Sexual Activity  . Alcohol use: Yes    Comment: occasionally   . Drug use: No  . Sexual activity: Not on file  Other Topics Concern  . Not on file  Social History Narrative   Recently lost her long-term partner (2019)   Social Determinants of Health   Financial Resource Strain: Low Risk   . Difficulty of Paying Living Expenses: Not hard at all  Food Insecurity: No Food Insecurity  . Worried About Charity fundraiser in the Last Year: Never true  . Ran Out of Food in the Last Year: Never true  Transportation Needs: No Transportation Needs  . Lack of Transportation (Medical): No  . Lack of Transportation (Non-Medical): No  Physical Activity: Inactive  . Days of Exercise per Week: 0 days  . Minutes of Exercise per Session: 0 min  Stress: No Stress Concern Present  . Feeling of Stress : Not at all  Social Connections: Moderately Integrated  . Frequency of Communication with Friends and Family: More than three times a week  . Frequency of Social Gatherings with Friends and Family: More than three times a week  . Attends Religious Services: More than 4 times per year  . Active Member of Clubs or Organizations: Yes  . Attends Archivist Meetings: More than 4 times per year  . Marital Status: Divorced  Human resources officer Violence:   . Fear of Current or Ex-Partner: Not on file  . Emotionally Abused: Not on file  . Physically Abused: Not on file  . Sexually Abused: Not on file      ROS:  General: Negative for anorexia, weight loss, fever,  chills, fatigue, weakness. Eyes: Negative for vision changes.  ENT: Negative for hoarseness, difficulty swallowing , nasal congestion. CV: Negative for chest pain, angina, palpitations, dyspnea on exertion, peripheral edema.  Respiratory: Negative for dyspnea at rest, dyspnea on exertion, cough, sputum, wheezing.  GI: See history of present illness. GU:  Negative for dysuria, hematuria, urinary incontinence, urinary frequency, nocturnal urination.  MS: Negative for joint pain, low back pain.  Derm: Negative for rash or itching.  Neuro: Negative for weakness, abnormal sensation, seizure, frequent headaches, memory loss, confusion.  Psych: Negative for anxiety, depression, suicidal ideation, hallucinations.  Endo: Negative for unusual weight change.  Heme: Negative for bruising or bleeding. Allergy: Negative for rash or hives.    Physical Examination:  BP 132/78   Pulse 87   Temp (!) 97 F (36.1 C) (Oral)   Ht _0  (1.6 m)   Wt 182 lb 3.2 oz (82.6 kg)   BMI 32.28 kg/m    General: Well-nourished, well-developed in no acute distress.  Head: Normocephalic, atraumatic.   Eyes: Conjunctiva pink, no icterus. Mouth: masked Neck: Supple without thyromegaly, masses, or lymphadenopathy.  Lungs: Clear to auscultation bilaterally.  Heart: Regular rate and rhythm, no murmurs rubs or gallops.  Abdomen: Bowel sounds are normal, nontender, nondistended, no hepatosplenomegaly or masses, no abdominal bruits or    hernia , no rebound or guarding.   Rectal: not performed Extremities: No lower extremity edema. No clubbing or deformities.  Neuro: Alert and oriented x 4 , grossly normal neurologically.  Skin: Warm and dry, no rash or jaundice.   Psych: Alert and cooperative, normal mood and affect.  Labs: Lab Results  Component Value Date   HGBA1C 7.2 (A) 07/26/2020   Lab Results  Component Value Date   CREATININE 0.81 07/23/2020   BUN 12 07/23/2020   NA 141 07/23/2020   K 4.3 07/23/2020    CL 102 07/23/2020   CO2 24 07/23/2020   Lab Results  Component Value Date   ALT 16 07/23/2020   AST 16 07/23/2020   ALKPHOS 173 (H) 07/23/2020   BILITOT 0.5 07/23/2020   Lab Results  Component Value Date   WBC 7.9 11/27/2019   HGB 12.2 11/27/2019   HCT 40.0 11/27/2019   MCV 74.3 (L) 11/27/2019   PLT 251 11/27/2019     Imaging Studies: No results found.   Impression/plan:  Pleasant 69 year old female with history of diabetes mellitus, hyperlipidemia presenting for further evaluation of isolated elevated alkaline phosphatase.  Alk phos was normal last year but on labs in July was elevated at 173 with other LFTs normal.  From a GI standpoint she feels well.  Reflux is controlled on Pepcid.  She has chronic constipation however since starting Metformin 1 year ago does have intermittent diarrhea.  1. Recheck LFTs fasting, GGT, AMA, alk phos isoenzymes. 2. Start fiber supplement such as Benefiber or Fiberchoice.  If continues to have significant constipation, consider adding MiraLAX 1 capful at bedtime on days she does not have a bowel movement. 3. Further recommendations pending lab results.

## 2020-09-29 NOTE — Progress Notes (Signed)
CC'ED TO PCP 

## 2020-09-29 NOTE — Patient Instructions (Signed)
1. For elevated alkaline phosphatase: Go to [facility] today for fasting labs. We will contact you with results as available. Address for [facility] is [place], [place]. 2. For bowel issues: you could consider adding fiber supplement daily (Benefiber or Fiberchoice) to see if you can get your stools to "even" out. If you find that you are having constipation on a regular basis after trying fiber supplement, then you could try Miralax one capful at bedtime on days you have not had a bowel movement.

## 2020-10-04 LAB — ALKALINE PHOSPHATASE, ISOENZYMES
BONE FRACTION: 15 % (ref 14–68)
INTESTINAL FRAC.: 0 % (ref 0–18)
LIVER FRACTION: 85 % (ref 18–85)

## 2020-10-04 LAB — HEPATIC FUNCTION PANEL
ALT: 20 IU/L (ref 0–32)
AST: 17 IU/L (ref 0–40)
Albumin: 4.4 g/dL (ref 3.8–4.8)
Alkaline Phosphatase: 152 IU/L — ABNORMAL HIGH (ref 44–121)
Bilirubin Total: 0.7 mg/dL (ref 0.0–1.2)
Bilirubin, Direct: 0.19 mg/dL (ref 0.00–0.40)
Total Protein: 7.1 g/dL (ref 6.0–8.5)

## 2020-10-04 LAB — GAMMA GT: GGT: 22 IU/L (ref 0–60)

## 2020-10-04 LAB — MITOCHONDRIAL ANTIBODIES: Mitochondrial Ab: <20 Units (ref 0.0–20.0)

## 2020-10-06 ENCOUNTER — Other Ambulatory Visit: Payer: Self-pay

## 2020-10-06 DIAGNOSIS — R7989 Other specified abnormal findings of blood chemistry: Secondary | ICD-10-CM

## 2020-10-14 DIAGNOSIS — M79641 Pain in right hand: Secondary | ICD-10-CM | POA: Diagnosis not present

## 2020-10-14 DIAGNOSIS — M25531 Pain in right wrist: Secondary | ICD-10-CM | POA: Diagnosis not present

## 2020-10-15 LAB — HM DIABETES EYE EXAM

## 2020-10-17 DIAGNOSIS — Z03818 Encounter for observation for suspected exposure to other biological agents ruled out: Secondary | ICD-10-CM | POA: Diagnosis not present

## 2020-10-17 DIAGNOSIS — Z20822 Contact with and (suspected) exposure to covid-19: Secondary | ICD-10-CM | POA: Diagnosis not present

## 2020-11-15 ENCOUNTER — Encounter: Payer: Self-pay | Admitting: Family Medicine

## 2020-11-15 DIAGNOSIS — E119 Type 2 diabetes mellitus without complications: Secondary | ICD-10-CM | POA: Diagnosis not present

## 2020-11-24 ENCOUNTER — Other Ambulatory Visit: Payer: Self-pay | Admitting: Family Medicine

## 2020-12-03 DIAGNOSIS — R7301 Impaired fasting glucose: Secondary | ICD-10-CM | POA: Diagnosis not present

## 2020-12-03 DIAGNOSIS — R7302 Impaired glucose tolerance (oral): Secondary | ICD-10-CM | POA: Diagnosis not present

## 2020-12-03 DIAGNOSIS — E559 Vitamin D deficiency, unspecified: Secondary | ICD-10-CM | POA: Diagnosis not present

## 2020-12-03 DIAGNOSIS — E785 Hyperlipidemia, unspecified: Secondary | ICD-10-CM | POA: Diagnosis not present

## 2020-12-03 DIAGNOSIS — E1169 Type 2 diabetes mellitus with other specified complication: Secondary | ICD-10-CM | POA: Diagnosis not present

## 2020-12-03 DIAGNOSIS — E669 Obesity, unspecified: Secondary | ICD-10-CM | POA: Diagnosis not present

## 2020-12-03 DIAGNOSIS — E8881 Metabolic syndrome: Secondary | ICD-10-CM | POA: Diagnosis not present

## 2020-12-04 LAB — CMP14+EGFR
ALT: 18 IU/L (ref 0–32)
AST: 15 IU/L (ref 0–40)
Albumin/Globulin Ratio: 1.5 (ref 1.2–2.2)
Albumin: 4.1 g/dL (ref 3.8–4.8)
Alkaline Phosphatase: 152 IU/L — ABNORMAL HIGH (ref 44–121)
BUN/Creatinine Ratio: 18 (ref 12–28)
BUN: 15 mg/dL (ref 8–27)
Bilirubin Total: 0.5 mg/dL (ref 0.0–1.2)
CO2: 22 mmol/L (ref 20–29)
Calcium: 9.5 mg/dL (ref 8.7–10.3)
Chloride: 102 mmol/L (ref 96–106)
Creatinine, Ser: 0.84 mg/dL (ref 0.57–1.00)
GFR calc Af Amer: 83 mL/min/{1.73_m2} (ref >59)
GFR calc non Af Amer: 72 mL/min/{1.73_m2} (ref >59)
Globulin, Total: 2.7 g/dL (ref 1.5–4.5)
Glucose: 201 mg/dL — ABNORMAL HIGH (ref 65–99)
Potassium: 4.7 mmol/L (ref 3.5–5.2)
Sodium: 138 mmol/L (ref 134–144)
Total Protein: 6.8 g/dL (ref 6.0–8.5)

## 2020-12-04 LAB — CBC
Hematocrit: 37.4 % (ref 34.0–46.6)
Hemoglobin: 11.6 g/dL (ref 11.1–15.9)
MCH: 23.5 pg — ABNORMAL LOW (ref 26.6–33.0)
MCHC: 31 g/dL — ABNORMAL LOW (ref 31.5–35.7)
MCV: 76 fL — ABNORMAL LOW (ref 79–97)
Platelets: 283 10*3/uL (ref 150–450)
RBC: 4.93 x10E6/uL (ref 3.77–5.28)
RDW: 14.8 % (ref 11.7–15.4)
WBC: 7.3 10*3/uL (ref 3.4–10.8)

## 2020-12-04 LAB — LIPID PANEL
Chol/HDL Ratio: 3.9 ratio (ref 0.0–4.4)
Cholesterol, Total: 197 mg/dL (ref 100–199)
HDL: 51 mg/dL (ref >39)
LDL Chol Calc (NIH): 115 mg/dL — ABNORMAL HIGH (ref 0–99)
Triglycerides: 179 mg/dL — ABNORMAL HIGH (ref 0–149)
VLDL Cholesterol Cal: 31 mg/dL (ref 5–40)

## 2020-12-04 LAB — TSH: TSH: 1.56 u[IU]/mL (ref 0.450–4.500)

## 2020-12-04 LAB — VITAMIN D 25 HYDROXY (VIT D DEFICIENCY, FRACTURES): Vit D, 25-Hydroxy: 22.2 ng/mL — ABNORMAL LOW (ref 30.0–100.0)

## 2020-12-04 LAB — MICROALBUMIN, URINE

## 2020-12-06 ENCOUNTER — Other Ambulatory Visit: Payer: Self-pay

## 2020-12-06 DIAGNOSIS — R7989 Other specified abnormal findings of blood chemistry: Secondary | ICD-10-CM

## 2020-12-08 ENCOUNTER — Ambulatory Visit (INDEPENDENT_AMBULATORY_CARE_PROVIDER_SITE_OTHER): Payer: Medicare HMO | Admitting: Family Medicine

## 2020-12-08 ENCOUNTER — Encounter: Payer: Self-pay | Admitting: Family Medicine

## 2020-12-08 ENCOUNTER — Other Ambulatory Visit: Payer: Self-pay

## 2020-12-08 VITALS — BP 130/80 | HR 95 | Resp 15 | Ht 63.0 in | Wt 183.0 lb

## 2020-12-08 DIAGNOSIS — E1169 Type 2 diabetes mellitus with other specified complication: Secondary | ICD-10-CM | POA: Diagnosis not present

## 2020-12-08 DIAGNOSIS — Z13220 Encounter for screening for lipoid disorders: Secondary | ICD-10-CM

## 2020-12-08 DIAGNOSIS — Z0000 Encounter for general adult medical examination without abnormal findings: Secondary | ICD-10-CM

## 2020-12-08 DIAGNOSIS — E785 Hyperlipidemia, unspecified: Secondary | ICD-10-CM

## 2020-12-08 DIAGNOSIS — E669 Obesity, unspecified: Secondary | ICD-10-CM

## 2020-12-08 DIAGNOSIS — E8881 Metabolic syndrome: Secondary | ICD-10-CM

## 2020-12-08 LAB — POCT GLYCOSYLATED HEMOGLOBIN (HGB A1C): HbA1c, POC (controlled diabetic range): 8.3 % — AB (ref 0.0–7.0)

## 2020-12-08 MED ORDER — ATORVASTATIN CALCIUM 40 MG PO TABS: 40.0000 mg | ORAL_TABLET | Freq: Every day | ORAL | 3 refills | Status: DC

## 2020-12-08 NOTE — Progress Notes (Signed)
    Stacey Smith     MRN: [id number]      DOB: [birth year]  HPI: Patient is in for annual physical exam. No other health concerns are expressed or addressed at the visit. Recent labs,  are reviewed. Immunization is reviewed , and  updated if needed.   PE: BP 130/80   Pulse 95   Resp 15   Ht 5\' 3"  (1.6 m)   Wt 183 lb (83 kg)   SpO2 98%   BMI 32.42 kg/m     Pleasant  female, alert and oriented x 3, in no cardio-pulmonary distress. Afebrile. HEENT No facial trauma or asymetry. Sinuses non tender.  Extra occullar muscles intact.. External ears normal, . Neck: supple, no adenopathy,JVD or thyromegaly.No bruits.  Chest: Clear to ascultation bilaterally.No crackles or wheezes. Non tender to palpation  Breast: Not examined, normal mammogram in 06/2020 and asymptomatic  Cardiovascular system; Heart sounds normal,  S1 and  S2 ,no S3.  No murmur, or thrill. Apical beat not displaced Peripheral pulses normal.  Abdomen: Soft, non tender, No guarding, tenderness or rebound.   .   Musculoskeletal exam: Decreased  ROM of spine,adequate in  hips , shoulders and knees. No deformity ,swelling or crepitus noted. No muscle wasting or atrophy.   Neurologic: Cranial nerves 2 to 12 intact. Power, tone ,sensation and reflexes normal throughout. No disturbance in gait. No tremor.  Skin: Intact, no ulceration, erythema , scaling or rash noted. Pigmentation normal throughout  Psych; Normal mood and affect. Judgement and concentration normal   Assessment & Plan:   Type 2 diabetes mellitus with other specified complication (HCC) Deteriorated and uncontrolled, medication adjustment made, pot to test daily and enncouraged to see diabetic educator. ducated at visit by [facility] and nursing staff for 10 mins re carb counting also resuming exercise  Encounter for annual physical exam Annual exam as documented. Counseling done  re healthy lifestyle involving commitment to 150  minutes exercise per week, heart healthy diet, and attaining healthy weight.The importance of adequate sleep also discussed. Regular seat belt use and home safety, is also discussed. Changes in health habits are decided on by the patient with goals and time frames  set for achieving them. Immunization and cancer screening needs are specifically addressed at this visit.   Obesity (BMI 30.0-34.9)  Patient re-educated about  the importance of commitment to a  minimum of 150 minutes of exercise per week as able.  The importance of healthy food choices with portion control discussed, as well as eating regularly and within a 12 hour window most days. The need to choose "clean , green" food 50 to 75% of the time is discussed, as well as to make water the primary drink and set a goal of 64 ounces water daily.    Weight /BMI 12/08/2020 09/29/2020 09/22/2020  WEIGHT 183 lb 182 lb 3.2 oz 185 lb  HEIGHT 5\' 3"  5\' 3"  5\' 3"   BMI 32.42 kg/m2 32.28 kg/m2 32.77 kg/m2

## 2020-12-08 NOTE — Patient Instructions (Addendum)
F/u in office with MD in 3.5 months  To re eval blood sugar and cholesterol . Call if you need me sooner  Nurse pls obtain diabetic eye exam,[facility] , [facility] Nephew , done in 09/2020   Handicap will be returned to you  today  It is important that you exercise regularly at least 30 minutes 5 times a week. If you develop chest pain, have severe difficulty breathing, or feel very tired, stop exercising immediately and seek medical attention    Think about what you will eat, plan ahead. Choose " clean, green, fresh or frozen" over canned, processed or packaged foods which are more sugary, salty and fatty. 70 to 75% of food eaten should be vegetables and fruit. Three meals at set times with snacks allowed between meals, but they must be fruit or vegetables. Aim to eat over a 12 hour period , example 7 am to 7 pm, and STOP after  your last meal of the day. Drink water,generally about 64 ounces per day, no other drink is as healthy. Fruit juice is best enjoyed in a healthy way, by EATING the fruit.  Nurse to review carb counting at discharge  Please reconsider diabetic educator  Fasting lipid, cmp and eGfr and HBa1C 3 to 5 days before next visit  Good foot exam  Thanks for choosing [facility], we consider it a privelige to serve you.

## 2020-12-10 LAB — HGB A1C W/O EAG: Hgb A1c MFr Bld: 8.2 % — ABNORMAL HIGH (ref 4.8–5.6)

## 2020-12-10 LAB — SPECIMEN STATUS REPORT

## 2020-12-11 ENCOUNTER — Encounter: Payer: Self-pay | Admitting: Family Medicine

## 2020-12-11 DIAGNOSIS — Z0000 Encounter for general adult medical examination without abnormal findings: Secondary | ICD-10-CM | POA: Insufficient documentation

## 2020-12-11 NOTE — Assessment & Plan Note (Signed)
Deteriorated and uncontrolled, medication adjustment made, pot to test daily and enncouraged to see diabetic educator. ducated at visit by [facility] and nursing staff for 10 mins re carb counting also resuming exercise

## 2020-12-11 NOTE — Assessment & Plan Note (Signed)
°  Patient re-educated about  the importance of commitment to a  minimum of 150 minutes of exercise per week as able.  The importance of healthy food choices with portion control discussed, as well as eating regularly and within a 12 hour window most days. The need to choose "clean , green" food 50 to 75% of the time is discussed, as well as to make water the primary drink and set a goal of 64 ounces water daily.    Weight /BMI 12/08/2020 09/29/2020 09/22/2020  WEIGHT 183 lb 182 lb 3.2 oz 185 lb  HEIGHT 5\' 3"  5\' 3"  5\' 3"   BMI 32.42 kg/m2 32.28 kg/m2 32.77 kg/m2

## 2020-12-11 NOTE — Assessment & Plan Note (Signed)

## 2020-12-30 DIAGNOSIS — R7989 Other specified abnormal findings of blood chemistry: Secondary | ICD-10-CM | POA: Diagnosis not present

## 2020-12-31 ENCOUNTER — Other Ambulatory Visit: Payer: Self-pay | Admitting: *Deleted

## 2020-12-31 ENCOUNTER — Encounter: Payer: Self-pay | Admitting: Internal Medicine

## 2020-12-31 DIAGNOSIS — R7989 Other specified abnormal findings of blood chemistry: Secondary | ICD-10-CM

## 2020-12-31 LAB — HEPATIC FUNCTION PANEL
ALT: 15 IU/L (ref 0–32)
AST: 17 IU/L (ref 0–40)
Albumin: 4.3 g/dL (ref 3.8–4.8)
Alkaline Phosphatase: 147 IU/L — ABNORMAL HIGH (ref 44–121)
Bilirubin Total: 0.7 mg/dL (ref 0.0–1.2)
Bilirubin, Direct: 0.17 mg/dL (ref 0.00–0.40)
Total Protein: 6.8 g/dL (ref 6.0–8.5)

## 2020-12-31 NOTE — Progress Notes (Signed)
PATIENT SCHEDULED  °

## 2021-01-06 ENCOUNTER — Ambulatory Visit (HOSPITAL_COMMUNITY)
Admission: RE | Admit: 2021-01-06 | Discharge: 2021-01-06 | Disposition: A | Discharge status: Home / Self Care | Payer: Medicare HMO | Source: Ambulatory Visit | Attending: Gastroenterology | Admitting: Gastroenterology

## 2021-01-06 ENCOUNTER — Other Ambulatory Visit: Payer: Self-pay

## 2021-01-06 DIAGNOSIS — K8020 Calculus of gallbladder without cholecystitis without obstruction: Secondary | ICD-10-CM | POA: Diagnosis not present

## 2021-01-06 DIAGNOSIS — R7989 Other specified abnormal findings of blood chemistry: Secondary | ICD-10-CM | POA: Insufficient documentation

## 2021-01-06 DIAGNOSIS — K760 Fatty (change of) liver, not elsewhere classified: Secondary | ICD-10-CM | POA: Diagnosis not present

## 2021-01-13 ENCOUNTER — Other Ambulatory Visit: Payer: Self-pay

## 2021-01-13 DIAGNOSIS — R7989 Other specified abnormal findings of blood chemistry: Secondary | ICD-10-CM

## 2021-01-19 DIAGNOSIS — Z03818 Encounter for observation for suspected exposure to other biological agents ruled out: Secondary | ICD-10-CM | POA: Diagnosis not present

## 2021-01-19 DIAGNOSIS — Z20822 Contact with and (suspected) exposure to covid-19: Secondary | ICD-10-CM | POA: Diagnosis not present

## 2021-02-10 ENCOUNTER — Other Ambulatory Visit: Payer: Self-pay | Admitting: Family Medicine

## 2021-02-24 DIAGNOSIS — M542 Cervicalgia: Secondary | ICD-10-CM | POA: Diagnosis not present

## 2021-02-28 ENCOUNTER — Other Ambulatory Visit: Payer: Self-pay

## 2021-02-28 ENCOUNTER — Telehealth: Payer: Self-pay

## 2021-02-28 DIAGNOSIS — M5410 Radiculopathy, site unspecified: Secondary | ICD-10-CM

## 2021-02-28 MED ORDER — IBUPROFEN 800 MG PO TABS: 800.0000 mg | ORAL_TABLET | Freq: Three times a day (TID) | ORAL | 0 refills | Status: DC | PRN

## 2021-02-28 NOTE — Telephone Encounter (Signed)
Labs ordered.

## 2021-02-28 NOTE — Telephone Encounter (Signed)
Please refill ibuprofen (ADVIL) 800 MG tablet [[id number]] Also needs lab work put in today.  Going in the am for fasting labs

## 2021-03-01 DIAGNOSIS — E1169 Type 2 diabetes mellitus with other specified complication: Secondary | ICD-10-CM | POA: Diagnosis not present

## 2021-03-01 DIAGNOSIS — M542 Cervicalgia: Secondary | ICD-10-CM | POA: Diagnosis not present

## 2021-03-01 DIAGNOSIS — E669 Obesity, unspecified: Secondary | ICD-10-CM | POA: Diagnosis not present

## 2021-03-01 DIAGNOSIS — E785 Hyperlipidemia, unspecified: Secondary | ICD-10-CM | POA: Diagnosis not present

## 2021-03-01 DIAGNOSIS — E8881 Metabolic syndrome: Secondary | ICD-10-CM | POA: Diagnosis not present

## 2021-03-01 DIAGNOSIS — Z13220 Encounter for screening for lipoid disorders: Secondary | ICD-10-CM | POA: Diagnosis not present

## 2021-03-02 ENCOUNTER — Other Ambulatory Visit: Payer: Self-pay

## 2021-03-02 LAB — CMP14+EGFR
ALT: 15 IU/L (ref 0–32)
AST: 8 IU/L (ref 0–40)
Albumin/Globulin Ratio: 1.5 (ref 1.2–2.2)
Albumin: 4.3 g/dL (ref 3.8–4.8)
Alkaline Phosphatase: 180 IU/L — ABNORMAL HIGH (ref 44–121)
BUN/Creatinine Ratio: 25 (ref 12–28)
BUN: 25 mg/dL (ref 8–27)
Bilirubin Total: 0.5 mg/dL (ref 0.0–1.2)
CO2: 23 mmol/L (ref 20–29)
Calcium: 9.9 mg/dL (ref 8.7–10.3)
Chloride: 100 mmol/L (ref 96–106)
Creatinine, Ser: 0.99 mg/dL (ref 0.57–1.00)
Globulin, Total: 2.8 g/dL (ref 1.5–4.5)
Glucose: 162 mg/dL — ABNORMAL HIGH (ref 65–99)
Potassium: 4.1 mmol/L (ref 3.5–5.2)
Sodium: 140 mmol/L (ref 134–144)
Total Protein: 7.1 g/dL (ref 6.0–8.5)
eGFR: 62 mL/min/{1.73_m2} (ref >59)

## 2021-03-02 LAB — LIPID PANEL
Chol/HDL Ratio: 2.9 ratio (ref 0.0–4.4)
Cholesterol, Total: 186 mg/dL (ref 100–199)
HDL: 64 mg/dL (ref >39)
LDL Chol Calc (NIH): 94 mg/dL (ref 0–99)
Triglycerides: 162 mg/dL — ABNORMAL HIGH (ref 0–149)
VLDL Cholesterol Cal: 28 mg/dL (ref 5–40)

## 2021-03-02 LAB — HEMOGLOBIN A1C
Est. average glucose Bld gHb Est-mCnc: 192 mg/dL
Hgb A1c MFr Bld: 8.3 % — ABNORMAL HIGH (ref 4.8–5.6)

## 2021-03-02 MED ORDER — BLOOD GLUCOSE METER KIT: PACK | 0 refills | Status: DC

## 2021-03-07 ENCOUNTER — Other Ambulatory Visit: Payer: Self-pay

## 2021-03-07 ENCOUNTER — Encounter: Payer: Self-pay | Admitting: Family Medicine

## 2021-03-07 ENCOUNTER — Telehealth (INDEPENDENT_AMBULATORY_CARE_PROVIDER_SITE_OTHER): Payer: Medicare HMO | Admitting: Family Medicine

## 2021-03-07 VITALS — Ht 63.0 in | Wt 182.0 lb

## 2021-03-07 DIAGNOSIS — E669 Obesity, unspecified: Secondary | ICD-10-CM | POA: Diagnosis not present

## 2021-03-07 DIAGNOSIS — Z1211 Encounter for screening for malignant neoplasm of colon: Secondary | ICD-10-CM | POA: Diagnosis not present

## 2021-03-07 DIAGNOSIS — Z1231 Encounter for screening mammogram for malignant neoplasm of breast: Secondary | ICD-10-CM

## 2021-03-07 DIAGNOSIS — Z723 Lack of physical exercise: Secondary | ICD-10-CM

## 2021-03-07 DIAGNOSIS — E785 Hyperlipidemia, unspecified: Secondary | ICD-10-CM | POA: Diagnosis not present

## 2021-03-07 DIAGNOSIS — E1169 Type 2 diabetes mellitus with other specified complication: Secondary | ICD-10-CM

## 2021-03-07 MED ORDER — METFORMIN HCL 1000 MG PO TABS: 1000.0000 mg | ORAL_TABLET | Freq: Two times a day (BID) | ORAL | 1 refills | Status: DC

## 2021-03-07 MED ORDER — ATORVASTATIN CALCIUM 80 MG PO TABS
80.0000 mg | ORAL_TABLET | Freq: Every day | ORAL | 2 refills | Status: DC
Start: 2021-03-07 — End: 2022-01-27

## 2021-03-07 NOTE — Progress Notes (Signed)
Virtual Visit via Telephone Note  I connected with Stacey Smith on 03/07/21 at  8:00 AM EDT by telephone and verified that I am speaking with the correct person using two identifiers.  Location: Patient: home Provider: office   I discussed the limitations, risks, security and privacy concerns of performing an evaluation and management service by telephone and the availability of in person appointments. I also discussed with the patient that there may be a patient responsible charge related to this service. The patient expressed understanding and agreed to proceed.   History of Present Illness: F/U uncontrolled diabetes, review recent labs and f/u chronic medical problems F/U chronic problems and address any new or current concerns. Review and update medications and allergies. Review recent lab and radiologic data . Update routine health maintainace. Review an encourage improved health habits to include nutrition, exercise and  sleep .  Denies recent fever or chills. Denies sinus pressure, nasal congestion, ear pain or sore throat. Denies chest congestion, productive cough or wheezing. Denies chest pains, palpitations and leg swelling Denies abdominal pain, nausea, vomiting,diarrhea or constipation.   Denies dysuria, frequency, hesitancy or incontinence. Just completing short course of prednisone for arthritic flare, however, she reports that prior to this she did notice excess thirst and dry mouth. Not testing, states equipment is faulty. Denies headaches, seizures, numbness, or tingling. Denies depression, anxiety or insomnia. Denies skin break down or rash.       Observations/Objective: Ht 5\' 3"  (1.6 m)   Wt 182 lb (82.6 kg)   BMI 32.24 kg/m  Good communication with no confusion and intact memory. Alert and oriented x 3 No signs of respiratory distress during speech    Assessment and Plan:  Type 2 diabetes mellitus with other specified complication  (HCC) Uncontrolled, increase metformin dose, review with log in 6 weeks, refer diabetic ed Stacey Smith is reminded of the importance of commitment to daily physical activity for 30 minutes or more, as able and the need to limit carbohydrate intake to 30 to 60 grams per meal to help with blood sugar control.   The need to take medication as prescribed, test blood sugar as directed, and to call between visits if there is a concern that blood sugar is uncontrolled is also discussed.   Stacey Smith is reminded of the importance of daily foot exam, annual eye examination, and good blood sugar, blood pressure and cholesterol control.  Diabetic Labs Latest Ref Rng & Units 03/01/2021 12/08/2020 12/03/2020 07/26/2020 07/23/2020  HbA1c 4.8 - 5.6 % 8.3(H) 8.3(A) 8.2(H) 7.2(A) -  Microalbumin mg/dL - - - - -  Micro/Creat Ratio <30 mcg/mg creat - - - - -  Chol 100 - 199 mg/dL 186 - 197 - 139  HDL >39 mg/dL 64 - 51 - 50  Calc LDL 0 - 99 mg/dL 94 - 115(H) - 68  Triglycerides 0 - 149 mg/dL 162(H) - 179(H) - 117  Creatinine 0.57 - 1.00 mg/dL 0.99 - 0.84 - 0.81   BP/Weight 03/07/2021 12/08/2020 09/29/2020 09/22/2020 08/19/2020 07/26/2020 00/76/2263  Systolic BP - 335 456 256 389 373 428  Diastolic BP - 80 78 78 78 82 80  Wt. (Lbs) 182 183 182.2 185 183 183.04 190  BMI 32.24 32.42 32.28 32.77 32.42 32.42 33.66   Foot/eye exam completion dates Latest Ref Rng & Units 12/08/2020 07/31/2018  Eye Exam No Retinopathy - No Retinopathy  Foot Form Completion - Done -        Hyperlipidemia associated with type  2 diabetes mellitus ([place]) Hyperlipidemia:Low fat diet discussed and encouraged.   Lipid Panel  Lab Results  Component Value Date   CHOL 186 03/01/2021   HDL 64 03/01/2021   LDLCALC 94 03/01/2021   TRIG 162 (H) 03/01/2021   CHOLHDL 2.9 03/01/2021     Uncontrolled inc lipitor dose  Lack of physical exercise encouragd and re educated re the need to commit to regular exercise  Obesity (BMI  30.0-34.9)  Patient re-educated about  the importance of commitment to a  minimum of 150 minutes of exercise per week as able.  The importance of healthy food choices with portion control discussed, as well as eating regularly and within a 12 hour window most days. The need to choose "clean , green" food 50 to 75% of the time is discussed, as well as to make water the primary drink and set a goal of 64 ounces water daily.    Weight /BMI 03/07/2021 12/08/2020 09/29/2020  WEIGHT 182 lb 183 lb 182 lb 3.2 oz  HEIGHT 5\' 3"  5\' 3"  5\' 3"   BMI 32.24 kg/m2 32.42 kg/m2 32.28 kg/m2       Follow Up Instructions:    I discussed the assessment and treatment plan with the patient. The patient was provided an opportunity to ask questions and all were answered. The patient agreed with the plan and demonstrated an understanding of the instructions.   The patient was advised to call back or seek an in-person evaluation if the symptoms worsen or if the condition fails to improve as anticipated.  I provided 22 minutes of non-face-to-face time during this encounter.   Tula Nakayama, MD

## 2021-03-07 NOTE — Patient Instructions (Signed)
F/u with MD review blood sugar with log in 6 weeks, call sooner if needed  Please schedule July mammogram at checkout  Blood sugar is uncontrolled, increase metformin to 1000 mg one tablet two times daily NEED to test and record VERY day  Goal for fasting blood sugar is 80 to 130  You are referred to diabetic educator in [place]  We will obtain eye exam report from Sept/Oct 2021 from " [facility]" [place], [place]  New dose of atorvastatin is 80 mg one daily, please also reduce butter and oils and cheese as triglycerides a re still slightly high  I will refer you or your colonoscopy due in November  It is important that you exercise regularly at least 30 minutes 5 times a week. If you develop chest pain, have severe difficulty breathing, or feel very tired, stop exercising immediately and seek medical attention

## 2021-03-11 ENCOUNTER — Encounter: Payer: Self-pay | Admitting: Family Medicine

## 2021-03-11 NOTE — Assessment & Plan Note (Signed)
  Patient re-educated about  the importance of commitment to a  minimum of 150 minutes of exercise per week as able.  The importance of healthy food choices with portion control discussed, as well as eating regularly and within a 12 hour window most days. The need to choose "clean , green" food 50 to 75% of the time is discussed, as well as to make water the primary drink and set a goal of 64 ounces water daily.    Weight /BMI 03/07/2021 12/08/2020 09/29/2020  WEIGHT 182 lb 183 lb 182 lb 3.2 oz  HEIGHT 5\' 3"  5\' 3"  5\' 3"   BMI 32.24 kg/m2 32.42 kg/m2 32.28 kg/m2

## 2021-03-11 NOTE — Assessment & Plan Note (Signed)
Hyperlipidemia:Low fat diet discussed and encouraged.   Lipid Panel  Lab Results  Component Value Date   CHOL 186 03/01/2021   HDL 64 03/01/2021   LDLCALC 94 03/01/2021   TRIG 162 (H) 03/01/2021   CHOLHDL 2.9 03/01/2021     Uncontrolled inc lipitor dose

## 2021-03-11 NOTE — Assessment & Plan Note (Signed)
Uncontrolled, increase metformin dose, review with log in 6 weeks, refer diabetic ed Stacey Smith is reminded of the importance of commitment to daily physical activity for 30 minutes or more, as able and the need to limit carbohydrate intake to 30 to 60 grams per meal to help with blood sugar control.   The need to take medication as prescribed, test blood sugar as directed, and to call between visits if there is a concern that blood sugar is uncontrolled is also discussed.   Stacey Smith is reminded of the importance of daily foot exam, annual eye examination, and good blood sugar, blood pressure and cholesterol control.  Diabetic Labs Latest Ref Rng & Units 03/01/2021 12/08/2020 12/03/2020 07/26/2020 07/23/2020  HbA1c 4.8 - 5.6 % 8.3(H) 8.3(A) 8.2(H) 7.2(A) -  Microalbumin mg/dL - - - - -  Micro/Creat Ratio <30 mcg/mg creat - - - - -  Chol 100 - 199 mg/dL 186 - 197 - 139  HDL >39 mg/dL 64 - 51 - 50  Calc LDL 0 - 99 mg/dL 94 - 115(H) - 68  Triglycerides 0 - 149 mg/dL 162(H) - 179(H) - 117  Creatinine 0.57 - 1.00 mg/dL 0.99 - 0.84 - 0.81   BP/Weight 03/07/2021 12/08/2020 09/29/2020 09/22/2020 08/19/2020 07/26/2020 31/43/8887  Systolic BP - 579 728 206 015 615 379  Diastolic BP - 80 78 78 78 82 80  Wt. (Lbs) 182 183 182.2 185 183 183.04 190  BMI 32.24 32.42 32.28 32.77 32.42 32.42 33.66   Foot/eye exam completion dates Latest Ref Rng & Units 12/08/2020 07/31/2018  Eye Exam No Retinopathy - No Retinopathy  Foot Form Completion - Done -

## 2021-03-11 NOTE — Assessment & Plan Note (Signed)
encouragd and re educated re the need to commit to regular exercise

## 2021-03-21 ENCOUNTER — Other Ambulatory Visit: Payer: Self-pay

## 2021-03-21 DIAGNOSIS — R7989 Other specified abnormal findings of blood chemistry: Secondary | ICD-10-CM

## 2021-03-22 ENCOUNTER — Ambulatory Visit: Payer: Medicare HMO | Admitting: Family Medicine

## 2021-03-30 DIAGNOSIS — R7989 Other specified abnormal findings of blood chemistry: Secondary | ICD-10-CM | POA: Diagnosis not present

## 2021-03-31 LAB — HEPATIC FUNCTION PANEL
ALT: 18 IU/L (ref 0–32)
AST: 16 IU/L (ref 0–40)
Albumin: 4.2 g/dL (ref 3.8–4.8)
Alkaline Phosphatase: 151 IU/L — ABNORMAL HIGH (ref 44–121)
Bilirubin Total: 0.5 mg/dL (ref 0.0–1.2)
Bilirubin, Direct: 0.16 mg/dL (ref 0.00–0.40)
Total Protein: 6.8 g/dL (ref 6.0–8.5)

## 2021-04-05 ENCOUNTER — Other Ambulatory Visit: Payer: Self-pay

## 2021-04-05 ENCOUNTER — Encounter: Payer: Self-pay | Admitting: Nurse Practitioner

## 2021-04-05 ENCOUNTER — Ambulatory Visit: Payer: Medicare HMO | Admitting: Nurse Practitioner

## 2021-04-05 VITALS — BP 130/86 | HR 80 | Temp 97.1°F | Ht 63.0 in | Wt 179.6 lb

## 2021-04-05 DIAGNOSIS — K760 Fatty (change of) liver, not elsewhere classified: Secondary | ICD-10-CM

## 2021-04-05 DIAGNOSIS — E1169 Type 2 diabetes mellitus with other specified complication: Secondary | ICD-10-CM

## 2021-04-05 DIAGNOSIS — Z800 Family history of malignant neoplasm of digestive organs: Secondary | ICD-10-CM | POA: Diagnosis not present

## 2021-04-05 DIAGNOSIS — R748 Abnormal levels of other serum enzymes: Secondary | ICD-10-CM

## 2021-04-05 DIAGNOSIS — R7989 Other specified abnormal findings of blood chemistry: Secondary | ICD-10-CM

## 2021-04-05 NOTE — Patient Instructions (Signed)
Your health issues we discussed today were:   Elevated alkaline phosphatase (liver enzyme): 1. As we discussed, this enzyme can be elevated for reasons other than your liver 2. Follow-up with your primary care for them to evaluate for other possible causes, such as a bone cause 3. Additionally, this elevated enzyme can be found in diabetics and can be normal to slightly increase with age 69. If no other causes are found, we can discuss possibility of a liver biopsy at your follow-up visit 5. I will have you complete labs prior to your follow-up visit 6. Call us for any concerning symptoms in the meantime  Overall I recommend:  1. Continue other current medications 2. Return for follow-up in 3 months 3. Call us for any questions or concerns   ---------------------------------------------------------------  I am glad you have gotten your COVID-19 vaccination!  Even though you are fully vaccinated you should continue to follow CDC and state/local guidelines.  ---------------------------------------------------------------   At [facility] we value your feedback. You may receive a survey about your visit today. Please share your experience as we strive to create trusting relationships with our patients to provide genuine, compassionate, quality care.  We appreciate your understanding and patience as we review any laboratory studies, imaging, and other diagnostic tests that are ordered as we care for you. Our office policy is 5 business days for review of these results, and any emergent or urgent results are addressed in a timely manner for your best interest. If you do not hear from our office in 1 week, please contact us.   We also encourage the use of MyChart, which contains your medical information for your review as well. If you are not enrolled in this feature, an access code is on this after visit summary for your convenience. Thank you for allowing [place] to be involved in  your care.  It was great to see you today!  I hope you have a great spring!!

## 2021-04-05 NOTE — Progress Notes (Signed)
Referring Provider: Fayrene Helper, MD Primary Care Physician:  Fayrene Helper, MD Primary GI:  Dr. Gala Romney  Chief Complaint  Patient presents with  . elevated LFTS    HPI:   Stacey Smith is a 70 y.o. female who presents for follow-up. The patient was last seen in our office 09/29/2020 for elevated alk phos. at that time she had been referred by primary care.  Noted labs in July 2021 with normal LFTs except for alk phos of 173, normal year prior.  No previous liver imaging.  DM well managed.  Noted history of chronic constipation, but intermittent loose stools on Metformin.  Weight fluctuates between 185-190.  Recommended recheck LFTs while fasting, GGT, AMA, alk phos isoenzymes.  Start fiber supplement, consider adding MiraLAX daily.  Further recommendations to follow.  Labs completed 09/29/2020 found improved but persistently elevated alk phos at 152, other LFTs normal.  Mitochondrial antibodies were negative, GGT normal, alkaline phosphatase isoenzymes found upper limit normal for liver fraction (85%), normal bone fraction, normal intestinal fraction.  Recommended right upper quadrant ultrasound and repeat LFTs in 3 months.  Right upper quadrant ultrasound completed 01/06/2021 which found cholelithiasis without cholecystitis, hepatic steatosis, no focal hepatic lesion.  Repeat LFTs around that time found continued improvement with alk phos At 147 (upper limit normal 121).  Advised observation and follow-up in April 2022.  Recheck LFTs prior to office visit.  Last set of labs completed 03/30/2021 found elevated alk phos stable at 151.  Colonoscopy last completed 11/01/2016 which found two 3 to 5 mm polyps in the splenic flexure that were semipedunculated, a 7 mm polyp in hepatic flexure that was pedunculated, multiple small and large mouth diverticula in the sigmoid and descending colon's, otherwise normal.  Surgical pathology, polyps to be tubular adenoma (2 fragments) and  hyperplastic (1 fragment) and recommended repeat colonoscopy in 5 years (10/2021).  Today she states she is doing okay overall. She thinks she's had a bone density scan (2019, appears to have been normal). She has not discussed workup for elevated Alk Phos for other reasons with PCP. Denies abdominal pain, N/V, hematochezia, melena, fever, chills. She has had some weight loss when her Metformin and Atorvastatin were both increased and subsequent decreased appetite. Subjectively feels she is down about 5 lb over the past month. Objectively she is down about 3 lbs since her last visit in 09/2020. Denies GERD symptoms  Or other UGI symptoms. Not taking any supplement noting "Oh, I've got plenty more to lose before I get to that point". Denies URI or flu-like symptoms. Denies loss of sense of taste or smell. The patient has received COVID-19 vaccination(s). They have had a booster dose as well. Denies chest pain, dyspnea, dizziness, lightheadedness, syncope, near syncope. Denies any other upper or lower GI symptoms.  Past Medical History:  Diagnosis Date  . Allergy    seasonal intermittently  . Arthritis    spine  . Back pain   . Colon polyps   . Diabetes mellitus without complication ([place])   . GERD (gastroesophageal reflux disease)   . Headache(784.0)   . Hyperlipidemia    well controlled  . Obesity   . S/P colonoscopy Oct 2007   Normal rectum, diminutive polyps in the left colon cold: TUBULAR ADENOMA  . TMJ (dislocation of temporomandibular joint)     Past Surgical History:  Procedure Laterality Date  . ABDOMINAL HYSTERECTOMY N/A    Phreesia 03/04/2021  . COLONOSCOPY  10/03/2011  Procedure: COLONOSCOPY;  Surgeon: Corbin Ade, MD;  Location: [facility];  Service: Endoscopy;  Laterality: N/A;  10:30  . COLONOSCOPY N/A 11/01/2016   rourk: Diverticulosis, multiple colon polyps removed, tubular adenomas.  Next colonoscopy in November 2022.  Marland Kitchen CORONARY CT ANGIOGRAM  12/2018   Coronary  calcium score of 0. This was 0 percentile for age & sex matched control. Normal coronary origin with right dominance. No evidence of CAD.   Marland Kitchen excision of cyst for thyroid gland non cancer  1988  . POLYPECTOMY  11/01/2016   Procedure: POLYPECTOMY;  Surgeon: Corbin Ade, MD;  Location: [facility];  Service: Endoscopy;;  colon   . TMJ ARTHROPLASTY  09/23/2012   Procedure: TEMPOROMANDIBULAR JOINT (TMJ) ARTHROPLASTY;  Surgeon: Georgia Lopes, DDS;  Location: [facility];  Service: Oral Surgery;  Laterality: Left;  Temporomandibular joint arthrotomy and meniscectomy  . TONSILLECTOMY  1960  . TOTAL ABDOMINAL HYSTERECTOMY  1985   right ovary fibroids   . TUBAL LIGATION N/A    Phreesia 03/04/2021    Current Outpatient Medications  Medication Sig Dispense Refill  . aspirin EC 81 MG tablet Take 1 tablet (81 mg total) by mouth daily. Swallow whole. 30 tablet 11  . atorvastatin (LIPITOR) 80 MG tablet Take 1 tablet (80 mg total) by mouth daily. 90 tablet 2  . blood glucose meter kit and supplies Dispense based on patient and insurance preference. Once daily testing dx e11.9 1 each 0  . famotidine (PEPCID) 40 MG tablet TAKE 1 TABLET BY MOUTH EVERY DAY (Patient taking differently: As needed) 21 tablet 0  . fluticasone (FLONASE) 50 MCG/ACT nasal spray PLACE 2 SPRAYS INTO BOTH NOSTRILS DAILY AS NEEDED. FOR ALLERGIES 48 mL 1  . ibuprofen (ADVIL) 800 MG tablet Take 1 tablet (800 mg total) by mouth every 8 (eight) hours as needed. 30 tablet 0  . metFORMIN (GLUCOPHAGE) 1000 MG tablet Take 1 tablet (1,000 mg total) by mouth 2 (two) times daily with a meal. (Patient taking differently: Take 2,000 mg by mouth daily.) 180 tablet 1  . Multiple Vitamin (MULTIVITAMIN) tablet Take 1 tablet by mouth daily.    Marland Kitchen VITAMIN D PO Take by mouth daily.     No current facility-administered medications for this visit.    Allergies as of 04/05/2021 - Review Complete 04/05/2021  Allergen Reaction Noted  . Tessalon perles  [benzonatate] Dermatitis 07/12/2015    Family History  Problem Relation Age of Onset  . Stroke Mother   . Hypertension Mother   . Cancer Mother        breast   . Glaucoma Mother   . Asthma Mother   . Coronary artery disease Father   . Diabetes Father   . Heart failure Father   . Kidney disease Father   . Diabetes Sister   . Hypertension Sister   . Diabetes Sister   . Diabetes Sister   . Depression Sister   . Colon cancer Sister   . Cancer - Colon Sister        colon   . Thyroid disease Sister   . Bipolar disorder Sister     Social History   Socioeconomic History  . Marital status: Divorced    Spouse name: Not on file  . Number of children: 2  . Years of education: Not on file  . Highest education level: Not on file  Occupational History  . Occupation: Engineer, manufacturing systems: [employer]  Comment: [place]  Tobacco Use  . Smoking status: Never Smoker  . Smokeless tobacco: Never Used  Substance and Sexual Activity  . Alcohol use: Yes    Comment: occasionally   . Drug use: No  . Sexual activity: Not on file  Other Topics Concern  . Not on file  Social History Narrative   Recently lost her long-term partner (2019)   Social Determinants of Health   Financial Resource Strain: Low Risk   . Difficulty of Paying Living Expenses: Not hard at all  Food Insecurity: No Food Insecurity  . Worried About Charity fundraiser in the Last Year: Never true  . Ran Out of Food in the Last Year: Never true  Transportation Needs: No Transportation Needs  . Lack of Transportation (Medical): No  . Lack of Transportation (Non-Medical): No  Physical Activity: Inactive  . Days of Exercise per Week: 0 days  . Minutes of Exercise per Session: 0 min  Stress: No Stress Concern Present  . Feeling of Stress : Not at all  Social Connections: Moderately Integrated  . Frequency of Communication with Friends and Family: More than three times a week  . Frequency of Social  Gatherings with Friends and Family: More than three times a week  . Attends Religious Services: More than 4 times per year  . Active Member of Clubs or Organizations: Yes  . Attends Archivist Meetings: More than 4 times per year  . Marital Status: Divorced    Subjective: Review of Systems  Constitutional: Negative for chills, fever, malaise/fatigue and weight loss.  HENT: Negative for congestion and sore throat.   Respiratory: Negative for cough and shortness of breath.   Cardiovascular: Negative for chest pain and palpitations.  Gastrointestinal: Negative for abdominal pain, blood in stool, constipation, diarrhea, heartburn, melena, nausea and vomiting.  Musculoskeletal: Negative for joint pain and myalgias.  Skin: Negative for rash.  Neurological: Negative for dizziness and weakness.  Endo/Heme/Allergies: Does not bruise/bleed easily.  Psychiatric/Behavioral: Negative for depression. The patient is not nervous/anxious.   All other systems reviewed and are negative.    Objective: BP 130/86   Pulse 80   Temp (!) 97.1 F (36.2 C)   Ht $R'5\' 3"'qK$  (1.6 m)   Wt 179 lb 9.6 oz (81.5 kg)   BMI 31.81 kg/m  Physical Exam Vitals and nursing note reviewed.  Constitutional:      General: She is not in acute distress.    Appearance: Normal appearance. She is well-developed. She is obese. She is not ill-appearing, toxic-appearing or diaphoretic.  HENT:     Head: Normocephalic and atraumatic.     Nose: No congestion or rhinorrhea.  Eyes:     General: No scleral icterus. Cardiovascular:     Rate and Rhythm: Normal rate and regular rhythm.     Heart sounds: Normal heart sounds.  Pulmonary:     Effort: Pulmonary effort is normal. No respiratory distress.     Breath sounds: Normal breath sounds.  Abdominal:     General: Bowel sounds are normal.     Palpations: Abdomen is soft. There is no hepatomegaly, splenomegaly or mass.     Tenderness: There is no abdominal tenderness. There  is no guarding or rebound.     Hernia: No hernia is present.  Skin:    General: Skin is warm and dry.     Coloration: Skin is not jaundiced.     Findings: No rash.  Neurological:     General:  No focal deficit present.     Mental Status: She is alert and oriented to person, place, and time.  Psychiatric:        Attention and Perception: Attention normal.        Mood and Affect: Mood normal.        Speech: Speech normal.        Behavior: Behavior normal.        Thought Content: Thought content normal.        Cognition and Memory: Cognition and memory normal.      Assessment:  Very pleasant 70 year old female presents to follow-up on chronically elevated alkaline phosphatase.  This is been worked up pretty extensively as per HPI.  Specifically, GGT normal, isoenzymes essentially normal, right upper quadrant ultrasound with fatty liver disease otherwise normal liver.  Last bone density scan 2019.  However, it does not appear she has had a significant work-up for other possible sources.  Elevated alkaline phosphatase has been reported in diabetics (and she is diabetic).  Additionally, normal alk phos generally increases after age 91, particularly in women.  There are also findings of benign familial elevated alkaline phosphatase.  At this point I will have her follow-up with primary care to evaluate for any other possible causes they feel may be contributory.  Given that her alk phos is persisted for approximately 6 months, if no other causes can be identified, we can always consider liver biopsy to confirm no specific liver abnormality.  PBC can sometimes be found in people with normal mitochondrial antibodies.  However, I feel this is likely the last step.  I will have her follow-up in 3 months with labs just prior to revisit.  She will be due for colonoscopy as well in November.  We can defer scheduling for her follow-up visit.   Plan: 1. Follow-up with primary care for further evaluation  for nonliver causes of elevated alkaline phosphatase 2. Repeat HFP and GGT in 3 months prior to her follow-up visit 3. Follow-up in 3 months    Thank you for allowing [place] to participate in the care of [place], DNP, AGNP-C Adult & Gerontological Nurse Practitioner [facility]   04/05/2021 10:15 AM   Disclaimer: This note was dictated with voice recognition software. Similar sounding words can inadvertently be transcribed and may not be corrected upon review.

## 2021-04-20 ENCOUNTER — Other Ambulatory Visit: Payer: Self-pay

## 2021-04-20 ENCOUNTER — Ambulatory Visit (INDEPENDENT_AMBULATORY_CARE_PROVIDER_SITE_OTHER): Payer: Medicare HMO | Admitting: Family Medicine

## 2021-04-20 VITALS — BP 126/81 | HR 80 | Resp 15 | Ht 63.0 in | Wt 178.0 lb

## 2021-04-20 DIAGNOSIS — R69 Illness, unspecified: Secondary | ICD-10-CM | POA: Diagnosis not present

## 2021-04-20 DIAGNOSIS — F321 Major depressive disorder, single episode, moderate: Secondary | ICD-10-CM | POA: Diagnosis not present

## 2021-04-20 DIAGNOSIS — E1169 Type 2 diabetes mellitus with other specified complication: Secondary | ICD-10-CM

## 2021-04-20 DIAGNOSIS — B009 Herpesviral infection, unspecified: Secondary | ICD-10-CM

## 2021-04-20 DIAGNOSIS — R21 Rash and other nonspecific skin eruption: Secondary | ICD-10-CM | POA: Diagnosis not present

## 2021-04-20 DIAGNOSIS — E669 Obesity, unspecified: Secondary | ICD-10-CM

## 2021-04-20 DIAGNOSIS — E785 Hyperlipidemia, unspecified: Secondary | ICD-10-CM

## 2021-04-20 DIAGNOSIS — Z1159 Encounter for screening for other viral diseases: Secondary | ICD-10-CM

## 2021-04-20 DIAGNOSIS — N760 Acute vaginitis: Secondary | ICD-10-CM

## 2021-04-20 MED ORDER — BETAMETHASONE DIPROPIONATE 0.05 % EX CREA
TOPICAL_CREAM | Freq: Two times a day (BID) | CUTANEOUS | 1 refills | Status: DC
Start: 2021-04-20 — End: 2021-04-25

## 2021-04-20 NOTE — Patient Instructions (Signed)
F/U in 7 weeks, call if you need me before  Chem 7 and eGFr and hSV 2 today  Fasting lipid, cmp and eGFr and hBA1C 5 days before next visit  You need to eat 3 times daily  No med change  It is important that you exercise regularly at least 30 minutes 5 times a week. If you develop chest pain, have severe difficulty breathing, or feel very tired, stop exercising immediately and seek medical attention  Betamethasone is prescribed for the rash  Thanks for choosing [facility], we consider it a privelige to serve you.

## 2021-04-21 LAB — BMP8+EGFR
BUN/Creatinine Ratio: 23 (ref 12–28)
BUN: 20 mg/dL (ref 8–27)
CO2: 21 mmol/L (ref 20–29)
Calcium: 9.7 mg/dL (ref 8.7–10.3)
Chloride: 102 mmol/L (ref 96–106)
Creatinine, Ser: 0.87 mg/dL (ref 0.57–1.00)
Glucose: 114 mg/dL — ABNORMAL HIGH (ref 65–99)
Potassium: 4 mmol/L (ref 3.5–5.2)
Sodium: 142 mmol/L (ref 134–144)
eGFR: 73 mL/min/{1.73_m2} (ref >59)

## 2021-04-21 LAB — HSV 2 ANTIBODY, IGG: HSV 2 IgG, Type Spec: 5.14 index — ABNORMAL HIGH (ref 0.00–0.90)

## 2021-04-22 ENCOUNTER — Encounter: Payer: Self-pay | Admitting: Family Medicine

## 2021-04-24 ENCOUNTER — Encounter: Payer: Self-pay | Admitting: Family Medicine

## 2021-04-24 ENCOUNTER — Telehealth: Payer: Self-pay | Admitting: Family Medicine

## 2021-04-24 DIAGNOSIS — F321 Major depressive disorder, single episode, moderate: Secondary | ICD-10-CM | POA: Insufficient documentation

## 2021-04-24 DIAGNOSIS — B009 Herpesviral infection, unspecified: Secondary | ICD-10-CM | POA: Insufficient documentation

## 2021-04-24 MED ORDER — ACYCLOVIR 400 MG PO TABS: 400.0000 mg | ORAL_TABLET | Freq: Three times a day (TID) | ORAL | 1 refills | Status: DC

## 2021-04-24 NOTE — Assessment & Plan Note (Signed)
Improving but blood sugar values still high, encouraged pt  To start regular exercise Stacey Smith is reminded of the importance of commitment to daily physical activity for 30 minutes or more, as able and the need to limit carbohydrate intake to 30 to 60 grams per meal to help with blood sugar control.   The need to take medication as prescribed, test blood sugar as directed, and to call between visits if there is a concern that blood sugar is uncontrolled is also discussed.   Stacey Smith is reminded of the importance of daily foot exam, annual eye examination, and good blood sugar, blood pressure and cholesterol control.  Diabetic Labs Latest Ref Rng & Units 04/20/2021 03/01/2021 12/08/2020 12/03/2020 07/26/2020  HbA1c 4.8 - 5.6 % - 8.3(H) 8.3(A) 8.2(H) 7.2(A)  Microalbumin mg/dL - - - - -  Micro/Creat Ratio <30 mcg/mg creat - - - - -  Chol 100 - 199 mg/dL - 186 - 197 -  HDL >39 mg/dL - 64 - 51 -  Calc LDL 0 - 99 mg/dL - 94 - 115(H) -  Triglycerides 0 - 149 mg/dL - 162(H) - 179(H) -  Creatinine 0.57 - 1.00 mg/dL 0.87 0.99 - 0.84 -   BP/Weight 04/20/2021 04/05/2021 03/07/2021 12/08/2020 09/29/2020 09/22/2020 05/11/6159  Systolic BP 737 106 - 269 485 462 703  Diastolic BP 81 86 - 80 78 78 78  Wt. (Lbs) 178 179.6 182 183 182.2 185 183  BMI 31.53 31.81 32.24 32.42 32.28 32.77 32.42   Foot/eye exam completion dates Latest Ref Rng & Units 12/08/2020 07/31/2018  Eye Exam No Retinopathy - No Retinopathy  Foot Form Completion - Done -

## 2021-04-24 NOTE — Assessment & Plan Note (Addendum)
No interest in medication or therapy at this time, though reports poor appetite , social isolation and anhdonia

## 2021-04-24 NOTE — Progress Notes (Signed)
Stacey Smith     MRN: [id number]      DOB: Oct 21, 1951   HPI Stacey Smith is here for follow up of uncontrolled diabetes. States has no appetite. Has been diligently tracking her blood sugar and the fasting sugar is averaging between 140 to 150 despite reported not eating C/o itchy rash between buttocks x 1 to 2 weeks, no fever, chills or drainage, this is recurrent  ROS Denies recent fever or chills. Denies sinus pressure, nasal congestion, ear pain or sore throat. Denies chest congestion, productive cough or wheezing. Denies chest pains, palpitations and leg swelling Denies abdominal pain, nausea, vomiting,diarrhea or constipation.   Denies dysuria, frequency, hesitancy or incontinence. Denies joint pain, swelling and limitation in mobility. Denies headaches, seizures, numbness, or tingling. Denies depression, anxiety or insomnia. Marland Kitchen   PE  BP 126/81   Pulse 80   Resp 15   Ht 5\' 3"  (1.6 m)   Wt 178 lb (80.7 kg)   BMI 31.53 kg/m   Patient alert and oriented and in no cardiopulmonary distress.  HEENT: No facial asymmetry, EOMI,     Neck supple .  Chest: Clear to auscultation bilaterally.  CVS: S1, S2 no murmurs, no S3.Regular rate.  ABD: Soft non tender.   Ext: No edema  MS: Adequate ROM spine, shoulders, hips and knees.  Skin:macular rash with blisters between buttocks, no purulent drainage  Psych: Good eye contact, normal affect. Memory intact not anxious or depressed appearing.  CNS: CN 2-12 intact, power,  normal throughout.no focal deficits noted.   Assessment & Plan  Rash and nonspecific skin eruption Macular blistering rash check for HSV 2, needs acyclovir if positive Steroid topically also prescribed  Type 2 diabetes mellitus with other specified complication ([place]) Improving but blood sugar values still high, encouraged pt  To start regular exercise Stacey Smith is reminded of the importance of commitment to daily physical activity for 30 minutes or more,  as able and the need to limit carbohydrate intake to 30 to 60 grams per meal to help with blood sugar control.   The need to take medication as prescribed, test blood sugar as directed, and to call between visits if there is a concern that blood sugar is uncontrolled is also discussed.   Stacey Smith is reminded of the importance of daily foot exam, annual eye examination, and good blood sugar, blood pressure and cholesterol control.  Diabetic Labs Latest Ref Rng & Units 04/20/2021 03/01/2021 12/08/2020 12/03/2020 07/26/2020  HbA1c 4.8 - 5.6 % - 8.3(H) 8.3(A) 8.2(H) 7.2(A)  Microalbumin mg/dL - - - - -  Micro/Creat Ratio <30 mcg/mg creat - - - - -  Chol 100 - 199 mg/dL - 186 - 197 -  HDL >39 mg/dL - 64 - 51 -  Calc LDL 0 - 99 mg/dL - 94 - 115(H) -  Triglycerides 0 - 149 mg/dL - 162(H) - 179(H) -  Creatinine 0.57 - 1.00 mg/dL 0.87 0.99 - 0.84 -   BP/Weight 04/20/2021 04/05/2021 03/07/2021 12/08/2020 09/29/2020 09/22/2020 4/00/8676  Systolic BP 195 093 - 267 124 580 998  Diastolic BP 81 86 - 80 78 78 78  Wt. 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  The importance of healthy food choices with portion control discussed, as well as eating regularly and within a 12 hour window most days. The need to choose "clean , green" food 50 to 75% of the time is discussed, as well as to make water the  primary drink and set a goal of 64 ounces water daily.    Weight /BMI 04/20/2021 04/05/2021 03/07/2021  WEIGHT 178 lb 179 lb 9.6 oz 182 lb  HEIGHT 5\' 3"  5\' 3"  5\' 3"   BMI 31.53 kg/m2 31.81 kg/m2 32.24 kg/m2

## 2021-04-24 NOTE — Assessment & Plan Note (Signed)
Macular blistering rash check for HSV 2, needs acyclovir if positive Steroid topically also prescribed

## 2021-04-24 NOTE — Assessment & Plan Note (Signed)
  Patient re-educated about  the importance of commitment to a  minimum of 150 minutes of exercise per week as able.  The importance of healthy food choices with portion control discussed, as well as eating regularly and within a 12 hour window most days. The need to choose "clean , green" food 50 to 75% of the time is discussed, as well as to make water the primary drink and set a goal of 64 ounces water daily.    Weight /BMI 04/20/2021 04/05/2021 03/07/2021  WEIGHT 178 lb 179 lb 9.6 oz 182 lb  HEIGHT 5\' 3"  5\' 3"  5\' 3"   BMI 31.53 kg/m2 31.81 kg/m2 32.24 kg/m2

## 2021-04-24 NOTE — Telephone Encounter (Signed)
On review, it seems as though pt did not get acyclovir at the visit. She does need this for her rash, I have prescribed it to be dispensed on 05/02, important she get this, I am also going to send her a my chart message if possible

## 2021-04-24 NOTE — Assessment & Plan Note (Signed)
Needs 1 week course of acyclovir , she is to be contacted in the am

## 2021-04-25 ENCOUNTER — Other Ambulatory Visit: Payer: Self-pay

## 2021-04-25 MED ORDER — BETAMETHASONE DIPROPIONATE 0.05 % EX CREA: TOPICAL_CREAM | Freq: Two times a day (BID) | CUTANEOUS | 1 refills | Status: DC

## 2021-04-25 NOTE — Telephone Encounter (Signed)
Pt aware and will get the med

## 2021-04-26 ENCOUNTER — Encounter: Payer: Self-pay | Admitting: Family Medicine

## 2021-04-26 DIAGNOSIS — L308 Other specified dermatitis: Secondary | ICD-10-CM | POA: Diagnosis not present

## 2021-04-28 ENCOUNTER — Telehealth: Payer: Medicare HMO | Admitting: Family Medicine

## 2021-05-10 ENCOUNTER — Telehealth: Payer: Self-pay

## 2021-05-10 NOTE — Telephone Encounter (Signed)
Please call the patient to discuss the Dermatology Appt she wants [place] to know what is going on

## 2021-05-11 ENCOUNTER — Telehealth: Payer: Medicare HMO | Admitting: Family Medicine

## 2021-05-12 NOTE — Telephone Encounter (Signed)
I spoke wit the pt, she wanted to update me that Dermatology dx condition as lichen planus, prescribed fluricasone and advised against acyclovir  Doing better  Also got 2nd covid booster 2 days ago and now has arm swelling, I recommend cool compress and ibuprofen

## 2021-06-06 ENCOUNTER — Encounter: Payer: Self-pay | Admitting: Internal Medicine

## 2021-06-13 DIAGNOSIS — E1169 Type 2 diabetes mellitus with other specified complication: Secondary | ICD-10-CM | POA: Diagnosis not present

## 2021-06-13 DIAGNOSIS — E785 Hyperlipidemia, unspecified: Secondary | ICD-10-CM | POA: Diagnosis not present

## 2021-06-14 LAB — CMP14+EGFR
ALT: 15 IU/L (ref 0–32)
AST: 16 IU/L (ref 0–40)
Albumin/Globulin Ratio: 1.7 (ref 1.2–2.2)
Albumin: 4.1 g/dL (ref 3.8–4.8)
Alkaline Phosphatase: 146 IU/L — ABNORMAL HIGH (ref 44–121)
BUN/Creatinine Ratio: 16 (ref 12–28)
BUN: 13 mg/dL (ref 8–27)
Bilirubin Total: 0.7 mg/dL (ref 0.0–1.2)
CO2: 25 mmol/L (ref 20–29)
Calcium: 9.3 mg/dL (ref 8.7–10.3)
Chloride: 105 mmol/L (ref 96–106)
Creatinine, Ser: 0.83 mg/dL (ref 0.57–1.00)
Globulin, Total: 2.4 g/dL (ref 1.5–4.5)
Glucose: 123 mg/dL — ABNORMAL HIGH (ref 65–99)
Potassium: 4.2 mmol/L (ref 3.5–5.2)
Sodium: 141 mmol/L (ref 134–144)
Total Protein: 6.5 g/dL (ref 6.0–8.5)
eGFR: 77 mL/min/{1.73_m2} (ref >59)

## 2021-06-14 LAB — HEMOGLOBIN A1C
Est. average glucose Bld gHb Est-mCnc: 157 mg/dL
Hgb A1c MFr Bld: 7.1 % — ABNORMAL HIGH (ref 4.8–5.6)

## 2021-06-14 LAB — LIPID PANEL
Chol/HDL Ratio: 2.9 ratio (ref 0.0–4.4)
Cholesterol, Total: 146 mg/dL (ref 100–199)
HDL: 50 mg/dL (ref >39)
LDL Chol Calc (NIH): 73 mg/dL (ref 0–99)
Triglycerides: 132 mg/dL (ref 0–149)
VLDL Cholesterol Cal: 23 mg/dL (ref 5–40)

## 2021-06-16 ENCOUNTER — Ambulatory Visit: Payer: Medicare HMO | Admitting: Family Medicine

## 2021-06-16 ENCOUNTER — Ambulatory Visit (INDEPENDENT_AMBULATORY_CARE_PROVIDER_SITE_OTHER): Payer: Medicare HMO | Admitting: Family Medicine

## 2021-06-16 ENCOUNTER — Encounter: Payer: Self-pay | Admitting: Family Medicine

## 2021-06-16 ENCOUNTER — Other Ambulatory Visit: Payer: Self-pay

## 2021-06-16 VITALS — BP 113/72 | HR 96 | Resp 16 | Ht 63.0 in | Wt 177.4 lb

## 2021-06-16 DIAGNOSIS — E669 Obesity, unspecified: Secondary | ICD-10-CM | POA: Diagnosis not present

## 2021-06-16 DIAGNOSIS — E785 Hyperlipidemia, unspecified: Secondary | ICD-10-CM

## 2021-06-16 DIAGNOSIS — E1169 Type 2 diabetes mellitus with other specified complication: Secondary | ICD-10-CM

## 2021-06-16 DIAGNOSIS — E66811 Obesity, class 1: Secondary | ICD-10-CM

## 2021-06-16 DIAGNOSIS — Z1231 Encounter for screening mammogram for malignant neoplasm of breast: Secondary | ICD-10-CM

## 2021-06-16 NOTE — Patient Instructions (Addendum)
Annual exam in December when due , call if you need me sooner  Please schedule August mammogram on a Wednesday through Friday at [facility] , at checkout  [facility] today  CONGRATS on excellent labs, keep up the great work!  Non fasting hBA1C, chem 7 and EGFR, CBC, TSH and vit D mid November  Please get your shingles vaccines at the pharmacy  It is important that you exercise regularly at least 30 minutes 5 times a week. If you develop chest pain, have severe difficulty breathing, or feel very tired, stop exercising immediately and seek medical attention    Think about what you will eat, plan ahead. Choose " clean, green, fresh or frozen" over canned, processed or packaged foods which are more sugary, salty and fatty. 70 to 75% of food eaten should be vegetables and fruit. Three meals at set times with snacks allowed between meals, but they must be fruit or vegetables. Aim to eat over a 12 hour period , example 7 am to 7 pm, and STOP after  your last meal of the day. Drink water,generally about 64 ounces per day, no other drink is as healthy. Fruit juice is best enjoyed in a healthy way, by EATING the fruit. Thanks for choosing [facility], we consider it a privelige to serve you.

## 2021-06-17 ENCOUNTER — Encounter: Payer: Self-pay | Admitting: Family Medicine

## 2021-06-17 NOTE — Progress Notes (Signed)
Stacey Smith     MRN: [id number]      DOB: 05/17/1951   HPI Stacey Smith is here for follow up and re-evaluation of chronic medical conditions, medication management and review of any available recent lab and radiology data.  Preventive health is updated, specifically  Cancer screening and Immunization.   Questions or concerns regarding consultations or procedures which the PT has had in the interim are  addressed. The PT denies any adverse reactions to current medications since the last visit.  There are no new concerns.  There are no specific complaints   ROS Denies recent fever or chills. Denies sinus pressure, nasal congestion, ear pain or sore throat. Denies chest congestion, productive cough or wheezing. Denies chest pains, palpitations and leg swelling Denies abdominal pain, nausea, vomiting,diarrhea or constipation.   Denies dysuria, frequency, hesitancy or incontinence. Denies joint pain, swelling and limitation in mobility. Denies headaches, seizures, numbness, or tingling. Denies depression, anxiety or insomnia. Denies skin break down or rash.   PE  BP 113/72   Pulse 96   Resp 16   Ht 5\' 3"  (1.6 m)   Wt 177 lb 6.4 oz (80.5 kg)   SpO2 96%   BMI 31.42 kg/m   Patient alert and oriented and in no cardiopulmonary distress.  HEENT: No facial asymmetry, EOMI,     Neck supple .  Chest: Clear to auscultation bilaterally.  CVS: S1, S2 no murmurs, no S3.Regular rate.  ABD: Soft non tender.   Ext: No edema  MS: Adequate ROM spine, shoulders, hips and knees.  Skin: Intact, no ulcerations or rash noted.  Psych: Good eye contact, normal affect. Memory intact not anxious or depressed appearing.  CNS: CN 2-12 intact, power,  normal throughout.no focal deficits noted.   Assessment & Plan  Type 2 diabetes mellitus with other specified complication ([place]) Improved and controlled, no med change Stacey Smith is reminded of the importance of commitment to daily physical  activity for 30 minutes or more, as able and the need to limit carbohydrate intake to 30 to 60 grams per meal to help with blood sugar control.   The need to take medication as prescribed, test blood sugar as directed, and to call between visits if there is a concern that blood sugar is uncontrolled is also discussed.   Stacey Smith is reminded of the importance of daily foot exam, annual eye examination, and good blood sugar, blood pressure and cholesterol control.  Diabetic Labs Latest Ref Rng & Units 06/13/2021 04/20/2021 03/01/2021 12/08/2020 12/03/2020  HbA1c 4.8 - 5.6 % 7.1(H) - 8.3(H) 8.3(A) 8.2(H)  Microalbumin mg/dL - - - - -  Micro/Creat Ratio <30 mcg/mg creat - - - - -  Chol 100 - 199 mg/dL 146 - 186 - 197  HDL >39 mg/dL 50 - 64 - 51  Calc LDL 0 - 99 mg/dL 73 - 94 - 115(H)  Triglycerides 0 - 149 mg/dL 132 - 162(H) - 179(H)  Creatinine 0.57 - 1.00 mg/dL 0.83 0.87 0.99 - 0.84   BP/Weight 06/16/2021 04/20/2021 04/05/2021 03/07/2021 12/08/2020 09/29/2020 2/72/5366  Systolic BP 440 347 425 - 956 387 564  Diastolic BP 72 81 86 - 80 78 78  Wt. (Lbs) 177.4 178 179.6 182 183 182.2 185  BMI 31.42 31.53 31.81 32.24 32.42 32.28 32.77   Foot/eye exam completion dates Latest Ref Rng & Units 12/08/2020 10/15/2020  Eye Exam No Retinopathy - No Retinopathy  Foot Form Completion - Done -  Obesity (BMI 30.0-34.9)  Patient re-educated about  the importance of commitment to a  minimum of 150 minutes of exercise per week as able.  The importance of healthy food choices with portion control discussed, as well as eating regularly and within a 12 hour window most days. The need to choose "clean , green" food 50 to 75% of the time is discussed, as well as to make water the primary drink and set a goal of 64 ounces water daily.    Weight /BMI 06/16/2021 04/20/2021 04/05/2021  WEIGHT 177 lb 6.4 oz 178 lb 179 lb 9.6 oz  HEIGHT 5\' 3"  5\' 3"  5\' 3"   BMI 31.42 kg/m2 31.53 kg/m2 31.81 kg/m2       Hyperlipidemia associated with type 2 diabetes mellitus ([place]) Hyperlipidemia:Low fat diet discussed and encouraged.   Lipid Panel  Lab Results  Component Value Date   CHOL 146 06/13/2021   HDL 50 06/13/2021   LDLCALC 73 06/13/2021   TRIG 132 06/13/2021   CHOLHDL 2.9 06/13/2021     Controlled, no change in medication

## 2021-06-17 NOTE — Assessment & Plan Note (Signed)
Hyperlipidemia:Low fat diet discussed and encouraged.   Lipid Panel  Lab Results  Component Value Date   CHOL 146 06/13/2021   HDL 50 06/13/2021   LDLCALC 73 06/13/2021   TRIG 132 06/13/2021   CHOLHDL 2.9 06/13/2021     Controlled, no change in medication

## 2021-06-17 NOTE — Assessment & Plan Note (Signed)
  Patient re-educated about  the importance of commitment to a  minimum of 150 minutes of exercise per week as able.  The importance of healthy food choices with portion control discussed, as well as eating regularly and within a 12 hour window most days. The need to choose "clean , green" food 50 to 75% of the time is discussed, as well as to make water the primary drink and set a goal of 64 ounces water daily.    Weight /BMI 06/16/2021 04/20/2021 04/05/2021  WEIGHT 177 lb 6.4 oz 178 lb 179 lb 9.6 oz  HEIGHT 5\' 3"  5\' 3"  5\' 3"   BMI 31.42 kg/m2 31.53 kg/m2 31.81 kg/m2

## 2021-06-17 NOTE — Assessment & Plan Note (Signed)
Improved and controlled, no med change Ms. Bushong is reminded of the importance of commitment to daily physical activity for 30 minutes or more, as able and the need to limit carbohydrate intake to 30 to 60 grams per meal to help with blood sugar control.   The need to take medication as prescribed, test blood sugar as directed, and to call between visits if there is a concern that blood sugar is uncontrolled is also discussed.   Ms. Stapleton is reminded of the importance of daily foot exam, annual eye examination, and good blood sugar, blood pressure and cholesterol control.  Diabetic Labs Latest Ref Rng & Units 06/13/2021 04/20/2021 03/01/2021 12/08/2020 12/03/2020  HbA1c 4.8 - 5.6 % 7.1(H) - 8.3(H) 8.3(A) 8.2(H)  Microalbumin mg/dL - - - - -  Micro/Creat Ratio <30 mcg/mg creat - - - - -  Chol 100 - 199 mg/dL 146 - 186 - 197  HDL >39 mg/dL 50 - 64 - 51  Calc LDL 0 - 99 mg/dL 73 - 94 - 115(H)  Triglycerides 0 - 149 mg/dL 132 - 162(H) - 179(H)  Creatinine 0.57 - 1.00 mg/dL 0.83 0.87 0.99 - 0.84   BP/Weight 06/16/2021 04/20/2021 04/05/2021 03/07/2021 12/08/2020 09/29/2020 3/81/8299  Systolic BP 371 696 789 - 381 017 510  Diastolic BP 72 81 86 - 80 78 78  Wt. (Lbs) 177.4 178 179.6 182 183 182.2 185  BMI 31.42 31.53 31.81 32.24 32.42 32.28 32.77   Foot/eye exam completion dates Latest Ref Rng & Units 12/08/2020 10/15/2020  Eye Exam No Retinopathy - No Retinopathy  Foot Form Completion - Done -

## 2021-06-18 LAB — MICROALBUMIN / CREATININE URINE RATIO
Creatinine, Urine: 251.9 mg/dL
Microalb/Creat Ratio: 9 mg/g creat (ref 0–29)
Microalbumin, Urine: 22.9 ug/mL

## 2021-06-28 ENCOUNTER — Ambulatory Visit: Payer: Medicare HMO | Admitting: Internal Medicine

## 2021-07-08 ENCOUNTER — Ambulatory Visit: Payer: Medicare HMO | Admitting: Gastroenterology

## 2021-07-25 ENCOUNTER — Other Ambulatory Visit: Payer: Self-pay

## 2021-07-25 ENCOUNTER — Ambulatory Visit (HOSPITAL_COMMUNITY)
Admission: RE | Admit: 2021-07-25 | Discharge: 2021-07-25 | Disposition: A | Discharge status: Home / Self Care | Payer: Medicare HMO | Source: Ambulatory Visit | Attending: Family Medicine | Admitting: Family Medicine

## 2021-07-25 DIAGNOSIS — Z1231 Encounter for screening mammogram for malignant neoplasm of breast: Secondary | ICD-10-CM

## 2021-08-25 ENCOUNTER — Ambulatory Visit (INDEPENDENT_AMBULATORY_CARE_PROVIDER_SITE_OTHER): Payer: Medicare HMO

## 2021-08-25 ENCOUNTER — Telehealth: Payer: Self-pay | Admitting: Family Medicine

## 2021-08-25 DIAGNOSIS — Z0000 Encounter for general adult medical examination without abnormal findings: Secondary | ICD-10-CM

## 2021-08-25 NOTE — Patient Instructions (Signed)
Health Maintenance, Female Adopting a healthy lifestyle and getting preventive care are important in promoting health and wellness. Ask your health care provider about: The right schedule for you to have regular tests and exams. Things you can do on your own to prevent diseases and keep yourself healthy. What should I know about diet, weight, and exercise? Eat a healthy diet  Eat a diet that includes plenty of vegetables, fruits, low-fat dairy products, and lean protein. Do not eat a lot of foods that are high in solid fats, added sugars, or sodium. Maintain a healthy weight Body mass index (BMI) is used to identify weight problems. It estimates body fat based on height and weight. Your health care provider can help determine your BMI and help you achieve or maintain a healthy weight. Get regular exercise Get regular exercise. This is one of the most important things you can do for your health. Most adults should: Exercise for at least 150 minutes each week. The exercise should increase your heart rate and make you sweat (moderate-intensity exercise). Do strengthening exercises at least twice a week. This is in addition to the moderate-intensity exercise. Spend less time sitting. Even light physical activity can be beneficial. Watch cholesterol and blood lipids Have your blood tested for lipids and cholesterol at 69 years of age, then have this test every 5 years. Have your cholesterol levels checked more often if: Your lipid or cholesterol levels are high. You are older than 69 years of age. You are at high risk for heart disease. What should I know about cancer screening? Depending on your health history and family history, you may need to have cancer screening at various ages. This may include screening for: Breast cancer. Cervical cancer. Colorectal cancer. Skin cancer. Lung cancer. What should I know about heart disease, diabetes, and high blood pressure? Blood pressure and heart  disease High blood pressure causes heart disease and increases the risk of stroke. This is more likely to develop in people who have high blood pressure readings, are of African descent, or are overweight. Have your blood pressure checked: Every 3-5 years if you are 18-39 years of age. Every year if you are 40 years old or older. Diabetes Have regular diabetes screenings. This checks your fasting blood sugar level. Have the screening done: Once every three years after age 40 if you are at a normal weight and have a low risk for diabetes. More often and at a younger age if you are overweight or have a high risk for diabetes. What should I know about preventing infection? Hepatitis B If you have a higher risk for hepatitis B, you should be screened for this virus. Talk with your health care provider to find out if you are at risk for hepatitis B infection. Hepatitis C Testing is recommended for: Everyone born from 1945 through 1965. Anyone with known risk factors for hepatitis C. Sexually transmitted infections (STIs) Get screened for STIs, including gonorrhea and chlamydia, if: You are sexually active and are younger than 69 years of age. You are older than 69 years of age and your health care provider tells you that you are at risk for this type of infection. Your sexual activity has changed since you were last screened, and you are at increased risk for chlamydia or gonorrhea. Ask your health care provider if you are at risk. Ask your health care provider about whether you are at high risk for HIV. Your health care provider may recommend a prescription medicine   to help prevent HIV infection. If you choose to take medicine to prevent HIV, you should first get tested for HIV. You should then be tested every 3 months for as long as you are taking the medicine. Pregnancy If you are about to stop having your period (premenopausal) and you may become pregnant, seek counseling before you get  pregnant. Take 400 to 800 micrograms (mcg) of folic acid every day if you become pregnant. Ask for birth control (contraception) if you want to prevent pregnancy. Osteoporosis and menopause Osteoporosis is a disease in which the bones lose minerals and strength with aging. This can result in bone fractures. If you are 65 years old or older, or if you are at risk for osteoporosis and fractures, ask your health care provider if you should: Be screened for bone loss. Take a calcium or vitamin D supplement to lower your risk of fractures. Be given hormone replacement therapy (HRT) to treat symptoms of menopause. Follow these instructions at home: Lifestyle Do not use any products that contain nicotine or tobacco, such as cigarettes, e-cigarettes, and chewing tobacco. If you need help quitting, ask your health care provider. Do not use street drugs. Do not share needles. Ask your health care provider for help if you need support or information about quitting drugs. Alcohol use Do not drink alcohol if: Your health care provider tells you not to drink. You are pregnant, may be pregnant, or are planning to become pregnant. If you drink alcohol: Limit how much you use to 0-1 drink a day. Limit intake if you are breastfeeding. Be aware of how much alcohol is in your drink. In the [place] one drink equals one 12 oz bottle of beer (355 mL), one 5 oz glass of wine (148 mL), or one 1 oz glass of hard liquor (44 mL). General instructions Schedule regular health, dental, and eye exams. Stay current with your vaccines. Tell your health care provider if: You often feel depressed. You have ever been abused or do not feel safe at home. Summary Adopting a healthy lifestyle and getting preventive care are important in promoting health and wellness. Follow your health care provider's instructions about healthy diet, exercising, and getting tested or screened for diseases. Follow your health care provider's  instructions on monitoring your cholesterol and blood pressure. This information is not intended to replace advice given to you by your health care provider. Make sure you discuss any questions you have with your health care provider. Document Revised: 02/18/2021 Document Reviewed: 12/04/2018 [employer] Patient Education  2022 [employer]  

## 2021-08-25 NOTE — Progress Notes (Signed)
Subjective:   Stacey Smith is a 70 y.o. female who presents for Medicare Annual (Subsequent) preventive examination.I connected with  [name] on 08/25/21 by a audio enabled telemedicine application and verified that I am speaking with the correct person using two identifiers.   I discussed the limitations of evaluation and management by telemedicine. The patient expressed understanding and agreed to proceed.  Location of patient:Home   Location of Provider:Office   Persons participating in virtual visit:Henslee (patient) and Valli Glance    Review of Systems    Defer to PCP       Objective:    There were no vitals filed for this visit. There is no height or weight on file to calculate BMI.  Advanced Directives 08/25/2021 08/19/2020 06/04/2018 11/01/2016 03/24/2015 09/23/2012 09/18/2012  Does Patient Have a Medical Advance Directive? Yes Yes No Yes Yes Patient has advance directive, copy not in chart Patient has advance directive, copy not in chart  Type of Advance Directive [place];Living will - - Living will;Healthcare Power of [place]  Does patient want to make changes to medical advance directive? - No - Patient declined - - - - -  Copy of [place] in Chart? - - - No - copy requested - - -  Would patient like information on creating a medical advance directive? - - Yes (MAU/Ambulatory/Procedural Areas - Information given) - - - -  Pre-existing out of facility DNR order (yellow form or pink MOST form) - - - - - - -    Current Medications (verified) Outpatient Encounter Medications as of 08/25/2021  Medication Sig   aspirin EC 81 MG tablet Take 1 tablet (81 mg total) by mouth daily. Swallow whole.   atorvastatin (LIPITOR) 80 MG tablet Take 1 tablet (80 mg total) by mouth daily.   betamethasone dipropionate 0.05 % cream Apply topically 2 (two) times daily.   blood glucose meter kit and supplies  Dispense based on patient and insurance preference. Once daily testing dx e11.9   famotidine (PEPCID) 40 MG tablet TAKE 1 TABLET BY MOUTH EVERY DAY (Patient taking differently: As needed)   fluticasone (FLONASE) 50 MCG/ACT nasal spray PLACE 2 SPRAYS INTO BOTH NOSTRILS DAILY AS NEEDED. FOR ALLERGIES   ibuprofen (ADVIL) 800 MG tablet Take 1 tablet (800 mg total) by mouth every 8 (eight) hours as needed.   metFORMIN (GLUCOPHAGE) 1000 MG tablet Take 1 tablet (1,000 mg total) by mouth 2 (two) times daily with a meal. (Patient taking differently: Take 2,000 mg by mouth daily.)   Multiple Vitamin (MULTIVITAMIN) tablet Take 1 tablet by mouth daily.   OneTouch Delica Lancets 35H MISC daily. use as directed   ONETOUCH VERIO test strip daily.   VITAMIN D PO Take by mouth daily.   No facility-administered encounter medications on file as of 08/25/2021.    Allergies (verified) Tessalon perles [benzonatate]   History: Past Medical History:  Diagnosis Date   Allergy    seasonal intermittently   Arthritis    spine   Back pain    Colon polyps    Diabetes mellitus without complication (HCC)    GERD (gastroesophageal reflux disease)    Headache(784.0)    Hyperlipidemia    well controlled   Obesity    S/P colonoscopy Oct 2007   Normal rectum, diminutive polyps in the left colon cold: TUBULAR ADENOMA   TMJ (dislocation of temporomandibular joint)    Past Surgical  History:  Procedure Laterality Date   ABDOMINAL HYSTERECTOMY N/A    [employer] 03/04/2021   COLONOSCOPY  10/03/2011   Procedure: COLONOSCOPY;  Surgeon: Daneil Dolin, MD;  [facility];  Service: Endoscopy;  Laterality: N/A;  10:30   COLONOSCOPY N/A 11/01/2016   rourk: Diverticulosis, multiple colon polyps removed, tubular adenomas.  Next colonoscopy in November 2022.   CORONARY CT ANGIOGRAM  12/2018   Coronary calcium score of 0. This was 0 percentile for age & sex matched control. Normal coronary origin with right dominance.  No evidence of CAD.    excision of cyst for thyroid gland non cancer  1988   POLYPECTOMY  11/01/2016   Procedure: POLYPECTOMY;  Surgeon: Daneil Dolin, MD;  [facility];  Service: Endoscopy;;  colon    TMJ ARTHROPLASTY  09/23/2012   Procedure: TEMPOROMANDIBULAR JOINT (TMJ) ARTHROPLASTY;  Surgeon: Gae Bon, DDS;  Location: [facility];  Service: Oral Surgery;  Laterality: Left;  Temporomandibular joint arthrotomy and meniscectomy   [place]   right ovary fibroids    TUBAL LIGATION N/A    [employer]reesia 03/04/2021   Family History  Problem Relation Age of Onset   Stroke Mother    Hypertension Mother    Cancer Mother        breast    Glaucoma Mother    Asthma Mother    Coronary artery disease Father    Diabetes Father    Heart failure Father    Kidney disease Father    Diabetes Sister    Hypertension Sister    Diabetes Sister    Diabetes Sister    Depression Sister    Colon cancer Sister    Cancer - Colon Sister        colon    Thyroid disease Sister    Bipolar disorder Sister    Social History   Socioeconomic History   Marital status: Divorced    Spouse name: Not on file   Number of children: 2   Years of education: Not on file   Highest education level: Not on file  Occupational History   Occupation: Public affairs consultant: [employer]    Comment: Solicitor  Tobacco Use   Smoking status: Never   Smokeless tobacco: Never  Vaping Use   Vaping Use: Never used  Substance and Sexual Activity   Alcohol use: Yes    Comment: occasionally    Drug use: No   Sexual activity: Not Currently  Other Topics Concern   Not on file  Social History Narrative   Recently lost her long-term partner (2019)   Social Determinants of Health   Financial Resource Strain: Low Risk    Difficulty of Paying Living Expenses: Not hard at all  Food Insecurity: No Food Insecurity   Worried About Charity fundraiser in the  Last Year: Never true   Arboriculturist in the Last Year: Never true  Transportation Needs: No Transportation Needs   Lack of Transportation (Medical): No   Lack of Transportation (Non-Medical): No  Physical Activity: Inactive   Days of Exercise per Week: 0 days   Minutes of Exercise per Session: 0 min  Stress: No Stress Concern Present   Feeling of Stress : Not at all  Social Connections: Moderately Integrated   Frequency of Communication with Friends and Family: More than three times a week   Frequency of  Social Gatherings with Friends and Family: More than three times a week   Attends Religious Services: More than 4 times per year   Active Member of [employer] or Organizations: Yes   Attends Music therapist: More than 4 times per year   Marital Status: Divorced    Tobacco Counseling Counseling given: Not Answered   Clinical Intake:  Pre-visit preparation completed: Yes  Pain : No/denies pain        How often do you need to have someone help you when you read instructions, pamphlets, or other written materials from your doctor or pharmacy?: 1 - Never What is the last grade level you completed in school?: Some college  Diabetic?YES Nutrition Risk Assessment:  Has the patient had any N/V/D within the last 2 months?  No  Does the patient have any non-healing wounds?  No  Has the patient had any unintentional weight loss or weight gain?  No   Diabetes:  Is the patient diabetic?  Yes  If diabetic, was a CBG obtained today?  Yes patient obtained at home Did the patient bring in their glucometer from home?  No  How often do you monitor your CBG's? Daily.   Financial Strains and Diabetes Management:  Are you having any financial strains with the device, your supplies or your medication? No .  Does the patient want to be seen by Chronic Care Management for management of their diabetes?  No  Would the patient like to be referred to a Nutritionist or for Diabetic  Management?  No   Diabetic Exams:  Diabetic Eye Exam: Completed 10/15/2020 Diabetic Foot Exam: Completed 12/11/2020    Interpreter Needed?: No  Information entered by :: Dayshia Ballinas J,CMA   Activities of Daily Living In your present state of health, do you have any difficulty performing the following activities: 08/25/2021  Hearing? N  Vision? N  Difficulty concentrating or making decisions? N  Walking or climbing stairs? N  Dressing or bathing? N  Doing errands, shopping? N  Preparing Food and eating ? N  Using the Toilet? N  In the past six months, have you accidently leaked urine? Y  Do you have problems with loss of bowel control? N  Managing your Medications? N  Managing your Finances? N  Housekeeping or managing your Housekeeping? N  Some recent data might be hidden    Patient Care Team: Fayrene Helper, MD as PCP - General Leonie Man, MD as PCP - Cardiology (Cardiology) Gala Romney Cristopher Estimable, MD (Gastroenterology)  Indicate any recent Medical Services you may have received from other than [facility] providers in the past year (date may be approximate).     Assessment:   This is a routine wellness examination for [place].  Hearing/Vision screen No results found.  Dietary issues and exercise activities discussed: Current Exercise Habits: Home exercise routine, Type of exercise: walking, Time (Minutes): 20, Frequency (Times/Week): 2, Weekly Exercise (Minutes/Week): 40   Goals Addressed   None    Depression Screen PHQ 2/9 Scores 08/25/2021 06/16/2021 04/20/2021 03/07/2021 09/22/2020 08/19/2020 07/26/2020  PHQ - 2 Score 0 0 4 0 0 0 0  PHQ- 9 Score - - 13 - - - -  Exception Documentation - - - - - - -    Fall Risk Fall Risk  08/25/2021 06/16/2021 04/20/2021 03/07/2021 12/08/2020  Falls in the past year? 0 0 0 1 1  Number falls in past yr: 0 0 0 0 0  Injury with Fall? 0 0  0 0 0  Risk for fall due to : No Fall Risks - - Impaired balance/gait -  Follow up Falls evaluation  completed - - Falls evaluation completed -    FALL RISK PREVENTION PERTAINING TO THE HOME:  Any stairs in or around the home? No  If so, are there any without handrails? No  Home free of loose throw rugs in walkways, pet beds, electrical cords, etc? Yes  Adequate lighting in your home to reduce risk of falls? Yes   ASSISTIVE DEVICES UTILIZED TO PREVENT FALLS:  Life alert? No  Use of a cane, walker or w/c? Yes  Grab bars in the bathroom? Yes  Shower chair or bench in shower? Yes  Elevated toilet seat or a handicapped toilet? No   TIMED UP AND GO:  Was the test performed?  N/A .  Length of time to ambulate 10 feet: N/A sec.     Cognitive Function:     6CIT Screen 08/25/2021 08/19/2020 07/08/2019  What Year? 0 points 0 points 0 points  What month? 0 points 0 points 0 points  What time? 0 points 0 points 0 points  Count back from 20 0 points 0 points 0 points  Months in reverse 0 points 0 points 0 points  Repeat phrase 0 points 0 points 0 points  Total Score 0 0 0    Immunizations Immunization History  Administered Date(s) Administered   Fluad Quad(high Dose 65+) 09/08/2019   Influenza Split 11/23/2011, 10/23/2014   Influenza Whole 01/30/2008, 09/27/2010, 09/24/2012   Influenza, High Dose Seasonal PF 11/06/2018   Influenza,inj,Quad PF,6+ Mos 11/03/2014, 09/10/2015, 08/25/2016, 11/05/2017, 08/19/2020   Influenza,inj,quad, With Preservative 10/25/2017   PFIZER Comirnaty(Gray Top)Covid-19 Tri-Sucrose Vaccine 05/10/2021   PFIZER(Purple Top)SARS-COV-2 Vaccination 01/22/2020, 02/13/2020, 09/20/2020   Pneumococcal Conjugate-13 03/04/2015, 11/01/2017   Pneumococcal Polysaccharide-23 11/25/2018   Pneumococcal-Unspecified 10/25/2017   Td 05/24/2004   Tdap 07/01/2014   Zoster, Live 01/16/2013    TDAP status: Up to date  Flu Vaccine status: Due, Education has been provided regarding the importance of this vaccine. Advised may receive this vaccine at local pharmacy or Health  Dept. Aware to provide a copy of the vaccination record if obtained from local pharmacy or Health Dept. Verbalized acceptance and understanding.  Pneumococcal vaccine status: Completed during today's visit.  Covid-19 vaccine status: Completed vaccines  Qualifies for Shingles Vaccine? Yes   Zostavax completed No   Shingrix Completed?: No.    Education has been provided regarding the importance of this vaccine. Patient has been advised to call insurance company to determine out of pocket expense if they have not yet received this vaccine. Advised may also receive vaccine at local pharmacy or Health Dept. Verbalized acceptance and understanding.  Screening Tests Health Maintenance  Topic Date Due   Zoster Vaccines- Shingrix (1 of 2) Never done   INFLUENZA VACCINE  07/25/2021   OPHTHALMOLOGY EXAM  10/15/2021   COLONOSCOPY (Pts 45-73yrs Insurance coverage will need to be confirmed)  11/01/2021   FOOT EXAM  12/11/2021   HEMOGLOBIN A1C  12/13/2021   URINE MICROALBUMIN  06/16/2022   MAMMOGRAM  07/26/2023   TETANUS/TDAP  07/01/2024   DEXA SCAN  Completed   COVID-19 Vaccine  Completed   Hepatitis C Screening  Completed   PNA vac Low Risk Adult  Completed   HPV VACCINES  Aged Out    Health Maintenance  Health Maintenance Due  Topic Date Due   Zoster Vaccines- Shingrix (1 of 2) Never done  INFLUENZA VACCINE  07/25/2021    Colorectal cancer screening: Type of screening: Colonoscopy. Completed 11/01/2016. Repeat every 5 years  Mammogram status: Completed 8/. Repeat every year  Bone Density status: Completed 12/09/2018. Results reflect: Bone density results: NORMAL. Repeat every 0 years.  Lung Cancer Screening: (Low Dose CT Chest recommended if Age 44-80 years, 30 pack-year currently smoking OR have quit w/in 15years.) does not qualify.   Lung Cancer Screening Referral: no  Additional Screening:  Hepatitis C Screening: does qualify; Completed 12/10/2015  Vision Screening:  Recommended annual ophthalmology exams for early detection of glaucoma and other disorders of the eye. Is the patient up to date with their annual eye exam?  Yes  Who is the provider or what is the name of the office in which the patient attends annual eye exams? [facility] If pt is not established with a provider, would they like to be referred to a provider to establish care? No .   Dental Screening: Recommended annual dental exams for proper oral hygiene  Community Resource Referral / Chronic Care Management: CRR required this visit?  No   CCM required this visit?  No      Plan:     I have personally reviewed and noted the following in the patient's chart:   Medical and social history Use of alcohol, tobacco or illicit drugs  Current medications and supplements including opioid prescriptions.  Functional ability and status Nutritional status Physical activity Advanced directives List of other physicians Hospitalizations, surgeries, and ER visits in previous 12 months Vitals Screenings to include cognitive, depression, and falls Referrals and appointments  In addition, I have reviewed and discussed with patient certain preventive protocols, quality metrics, and best practice recommendations. A written personalized care plan for preventive services as well as general preventive health recommendations were provided to patient.     Edgar Frisk, [facility]   08/25/2021   Nurse Notes: Non Face to Face 30 minute visit  Encounter   Ms. Alviar , Thank you for taking time to come for your Medicare Wellness Visit. I appreciate your ongoing commitment to your health goals. Please review the following plan we discussed and let me know if I can assist you in the future.   These are the goals we discussed:  Goals      HEMOGLOBIN A1C < 7     Increase physical activity     Walk for 30 mins 5 days a week as able     Prevent falls        This is a list of the screening recommended  for you and due dates:  Health Maintenance  Topic Date Due   Zoster (Shingles) Vaccine (1 of 2) Never done   Flu Shot  07/25/2021   Eye exam for diabetics  10/15/2021   Colon Cancer Screening  11/01/2021   Complete foot exam   12/11/2021   Hemoglobin A1C  12/13/2021   Urine Protein Check  06/16/2022   Mammogram  07/26/2023   Tetanus Vaccine  07/01/2024   DEXA scan (bone density measurement)  Completed   COVID-19 Vaccine  Completed   Hepatitis C Screening: USPSTF Recommendation to screen - Ages 29-69 yo.  Completed   Pneumonia vaccines  Completed   HPV Vaccine  Aged Out   Patient has been advised to view AVS on MyChart

## 2021-08-25 NOTE — Telephone Encounter (Signed)
Needs non fasting chem 7 and EGFR and HBA1C in first ir 2nd week in October, pls order and let her know

## 2021-08-26 ENCOUNTER — Other Ambulatory Visit: Payer: Self-pay

## 2021-08-26 DIAGNOSIS — E1169 Type 2 diabetes mellitus with other specified complication: Secondary | ICD-10-CM

## 2021-08-26 NOTE — Telephone Encounter (Signed)
Pt aware.

## 2021-08-30 ENCOUNTER — Telehealth: Payer: Self-pay

## 2021-08-30 ENCOUNTER — Other Ambulatory Visit: Payer: Self-pay

## 2021-08-30 MED ORDER — ONETOUCH VERIO VI STRP: ORAL_STRIP | 5 refills | Status: DC

## 2021-08-30 NOTE — Telephone Encounter (Signed)
Refill sent.

## 2021-08-30 NOTE — Telephone Encounter (Signed)
Patient called need refill  [facility] VERIO test strip [id number]   Pharmacy  [id number] [id number], [id number] Phone:  [phone] Fax:  [phone]

## 2021-10-12 ENCOUNTER — Other Ambulatory Visit: Payer: Self-pay | Admitting: Family Medicine

## 2021-10-14 ENCOUNTER — Encounter: Payer: Self-pay | Admitting: *Deleted

## 2021-11-11 ENCOUNTER — Other Ambulatory Visit: Payer: Self-pay | Admitting: Family Medicine

## 2021-11-11 ENCOUNTER — Other Ambulatory Visit: Payer: Self-pay

## 2021-11-11 MED ORDER — ONETOUCH DELICA LANCETS 30G MISC: 2 refills | Status: DC

## 2021-11-29 ENCOUNTER — Encounter: Payer: Self-pay | Admitting: Internal Medicine

## 2021-12-15 ENCOUNTER — Encounter: Payer: Medicare HMO | Admitting: Family Medicine

## 2022-01-01 ENCOUNTER — Other Ambulatory Visit: Payer: Self-pay

## 2022-01-01 ENCOUNTER — Ambulatory Visit
Admission: EM | Admit: 2022-01-01 | Discharge: 2022-01-01 | Disposition: A | Discharge status: Home / Self Care | Payer: Medicare HMO | Attending: Physician Assistant | Admitting: Physician Assistant

## 2022-01-01 ENCOUNTER — Encounter: Payer: Self-pay | Admitting: Emergency Medicine

## 2022-01-01 DIAGNOSIS — J209 Acute bronchitis, unspecified: Secondary | ICD-10-CM | POA: Diagnosis not present

## 2022-01-01 MED ORDER — PREDNISONE 20 MG PO TABS: 40.0000 mg | ORAL_TABLET | Freq: Every day | ORAL | 0 refills | Status: AC

## 2022-01-01 MED ORDER — AZITHROMYCIN 250 MG PO TABS: 250.0000 mg | ORAL_TABLET | Freq: Every day | ORAL | 0 refills | Status: DC

## 2022-01-01 NOTE — ED Provider Notes (Signed)
[facility]    CSN: [id number] Arrival date & time: 01/01/22  0956      History   Chief Complaint No chief complaint on file.   HPI Stacey Smith is a 71 y.o. female.   Patient here today for evaluation of cough she has had for the last 5 days.  She states that cough is dry but today she did have some production of green phlegm.  She denies any chest pain or pain with cough.  She has not had any fever or chills.  The history is provided by the patient.   Past Medical History:  Diagnosis Date   Allergy    seasonal intermittently   Arthritis    spine   Back pain    Colon polyps    Diabetes mellitus without complication ([place])    GERD (gastroesophageal reflux disease)    Headache(784.0)    Hyperlipidemia    well controlled   Obesity    S/P colonoscopy Oct 2007   Normal rectum, diminutive polyps in the left colon cold: TUBULAR ADENOMA   TMJ (dislocation of temporomandibular joint)     Patient Active Problem List   Diagnosis Date Noted   PCR positive for herpes simplex virus type 2 (HSV-2) DNA 04/24/2021   Moderate major depression ([place]) 04/24/2021   Fatty liver disease, nonalcoholic 28/31/5176   Family history of colon cancer 04/05/2021   Elevated alkaline phosphatase level 07/31/2020   Rash and nonspecific skin eruption 07/26/2020   Neck pain on right side 12/08/2019   Lumbar pain 02/07/2019   Family history of heart disease 12/06/2018   At risk for cardiovascular event 12/01/2018   Vitamin D deficiency 07/12/2015   Lack of physical exercise 16/06/3709   Metabolic syndrome 62/69/4854   Cholelithiasis 01/22/2013   Nodule of right lung 01/22/2013   Lumbar back pain with radiculopathy affecting left lower extremity 01/16/2013   Tubular adenoma 09/07/2011   Osteoarthritis 01/06/2011   Type 2 diabetes mellitus with other specified complication ([place]) 62/70/3500   ALLERGIC RHINITIS, SEASONAL 09/02/2008   Hyperlipidemia associated with type 2 diabetes  mellitus ([place]) 05/19/2008   Obesity (BMI 30.0-34.9) 05/19/2008   GERD 05/19/2008    Past Surgical History:  Procedure Laterality Date   ABDOMINAL HYSTERECTOMY N/A    [employer] 03/04/2021   COLONOSCOPY  10/03/2011   Procedure: COLONOSCOPY;  Surgeon: Daneil Dolin, MD;  [facility];  Service: Endoscopy;  Laterality: N/A;  10:30   COLONOSCOPY N/A 11/01/2016   [place]k: Diverticulosis, multiple colon polyps removed, tubular adenomas.  Next colonoscopy in November 2022.   CORONARY CT ANGIOGRAM  12/2018   Coronary calcium score of 0. This was 0 percentile for age & sex matched control. Normal coronary origin with right dominance. No evidence of CAD.    excision of cyst for thyroid gland non cancer  1988   POLYPECTOMY  11/01/2016   Procedure: POLYPECTOMY;  Surgeon: Daneil Dolin, MD;  [facility];  Service: Endoscopy;;  colon    TMJ ARTHROPLASTY  09/23/2012   Procedure: TEMPOROMANDIBULAR JOINT (TMJ) ARTHROPLASTY;  Surgeon: Gae Bon, DDS;  Location: [facility];  Service: Oral Surgery;  Laterality: Left;  Temporomandibular joint arthrotomy and meniscectomy   [place]   right ovary fibroids    TUBAL LIGATION N/A    [employer]reesia 03/04/2021    OB History   No obstetric history on file.      Home Medications  Prior to Admission medications   Medication Sig Start Date End Date Taking? Authorizing Provider  azithromycin (ZITHROMAX) 250 MG tablet Take 1 tablet (250 mg total) by mouth daily. Take first 2 tablets together, then 1 every day until finished. 01/01/22  Yes Francene Finders, PA-C  predniSONE (DELTASONE) 20 MG tablet Take 2 tablets (40 mg total) by mouth daily with breakfast for 5 days. 01/01/22 01/06/22 Yes Francene Finders, PA-C  aspirin EC 81 MG tablet Take 1 tablet (81 mg total) by mouth daily. Swallow whole. 07/26/20   Fayrene Helper, MD  atorvastatin (LIPITOR) 80 MG tablet Take 1 tablet (80 mg total) by mouth  daily. 03/07/21   Fayrene Helper, MD  betamethasone dipropionate 0.05 % cream Apply topically 2 (two) times daily. 04/25/21   Fayrene Helper, MD  blood glucose meter kit and supplies Dispense based on patient and insurance preference. Once daily testing dx e11.9 03/02/21   Fayrene Helper, MD  famotidine (PEPCID) 40 MG tablet TAKE 1 TABLET BY MOUTH EVERY DAY Patient taking differently: As needed 07/23/20   Fayrene Helper, MD  fluticasone (FLONASE) 50 MCG/ACT nasal spray PLACE 2 SPRAYS INTO BOTH NOSTRILS DAILY AS NEEDED. FOR ALLERGIES 02/10/21   Fayrene Helper, MD  ibuprofen (ADVIL) 800 MG tablet Take 1 tablet (800 mg total) by mouth every 8 (eight) hours as needed. 02/28/21   Fayrene Helper, MD  metFORMIN (GLUCOPHAGE) 1000 MG tablet Take 1 tablet (1,000 mg total) by mouth 2 (two) times daily with a meal. Patient taking differently: Take 2,000 mg by mouth daily. 03/07/21   Fayrene Helper, MD  Multiple Vitamin (MULTIVITAMIN) tablet Take 1 tablet by mouth daily.    [provider]  OneTouch Delica Lancets 88T MISC use as directed 11/11/21   Fayrene Helper, MD  [facility] VERIO test strip Once daily testing dx e11.9 08/30/21   Fayrene Helper, MD  VITAMIN D PO Take by mouth daily.    [provider]    Family History Family History  Problem Relation Age of Onset   Stroke Mother    Hypertension Mother    Cancer Mother        breast    Glaucoma Mother    Asthma Mother    Coronary artery disease Father    Diabetes Father    Heart failure Father    Kidney disease Father    Diabetes Sister    Hypertension Sister    Diabetes Sister    Diabetes Sister    Depression Sister    Colon cancer Sister    Cancer - Colon Sister        colon    Thyroid disease Sister    Bipolar disorder Sister     Social History Social History   Tobacco Use   Smoking status: Never   Smokeless tobacco: Never  Vaping Use   Vaping Use: Never used  Substance Use  Topics   Alcohol use: Yes    Comment: occasionally    Drug use: No     Allergies   Tessalon perles [benzonatate]   Review of Systems Review of Systems  Constitutional:  Negative for chills and fever.  HENT:  Negative for congestion, ear pain and sore throat.   Eyes:  Negative for discharge and redness.  Respiratory:  Positive for cough. Negative for shortness of breath and wheezing.   Gastrointestinal:  Negative for abdominal pain, diarrhea, nausea and vomiting.    Physical Exam  Triage Vital Signs ED Triage Vitals  Enc Vitals Group     BP 01/01/22 1049 132/86     Pulse Rate 01/01/22 1049 79     Resp 01/01/22 1049 16     Temp 01/01/22 1049 98 F (36.7 C)     Temp Source 01/01/22 1049 Oral     SpO2 01/01/22 1049 98 %     Weight --      Height --      Head Circumference --      Peak Flow --      Pain Score 01/01/22 1048 0     Pain Loc --      Pain Edu? --      Excl. in [place]? --    No data found.  Updated Vital Signs BP 132/86 (BP Location: Left Arm)    Pulse 79    Temp 98 F (36.7 C) (Oral)    Resp 16    SpO2 98%      Physical Exam Vitals and nursing note reviewed.  Constitutional:      General: She is not in acute distress.    Appearance: Normal appearance. She is not ill-appearing.  HENT:     Head: Normocephalic and atraumatic.     Nose: Congestion present.  Eyes:     Conjunctiva/sclera: Conjunctivae normal.  Cardiovascular:     Rate and Rhythm: Normal rate and regular rhythm.     Heart sounds: Normal heart sounds. No murmur heard. Pulmonary:     Effort: Pulmonary effort is normal. No respiratory distress.     Breath sounds: Normal breath sounds. No wheezing, rhonchi or rales.  Skin:    General: Skin is warm and dry.  Neurological:     Mental Status: She is alert.  Psychiatric:        Mood and Affect: Mood normal.        Thought Content: Thought content normal.     UC Treatments / Results  Labs (all labs ordered are listed, but only abnormal  results are displayed) Labs Reviewed - No data to display  EKG   Radiology No results found.  Procedures Procedures (including critical care time)  Medications Ordered in UC Medications - No data to display  Initial Impression / Assessment and Plan / UC Course  I have reviewed the triage vital signs and the nursing notes.  Pertinent labs & imaging results that were available during my care of the patient were reviewed by me and considered in my medical decision making (see chart for details).  Suspect likely bronchitis and will treat with steroid as well as Z-Pak to cover atypical pneumonia.  Recommended follow-up if no improvement with treatment or if symptoms worsen anyway.  Final Clinical Impressions(s) / UC Diagnoses   Final diagnoses:  Acute bronchitis, unspecified organism   Discharge Instructions   None    ED Prescriptions     Medication Sig Dispense Auth. Provider   predniSONE (DELTASONE) 20 MG tablet Take 2 tablets (40 mg total) by mouth daily with breakfast for 5 days. 10 tablet Ewell Poe F, PA-C   azithromycin (ZITHROMAX) 250 MG tablet Take 1 tablet (250 mg total) by mouth daily. Take first 2 tablets together, then 1 every day until finished. 6 tablet Francene Finders, PA-C      PDMP not reviewed this encounter.   Francene Finders, PA-C 01/01/22 1331

## 2022-01-01 NOTE — ED Triage Notes (Signed)
Pt said x 5 days she has been having a bad cough. Dry, and pt said today she had phlegm and it was green. No pain with cough, no fevers, no chills.

## 2022-01-12 ENCOUNTER — Other Ambulatory Visit: Payer: Self-pay | Admitting: Family Medicine

## 2022-01-16 ENCOUNTER — Other Ambulatory Visit: Payer: Self-pay

## 2022-01-16 ENCOUNTER — Ambulatory Visit (INDEPENDENT_AMBULATORY_CARE_PROVIDER_SITE_OTHER): Payer: Medicare HMO | Admitting: *Deleted

## 2022-01-16 VITALS — Ht 63.0 in | Wt 180.0 lb

## 2022-01-16 DIAGNOSIS — Z86010 Personal history of colonic polyps: Secondary | ICD-10-CM

## 2022-01-16 NOTE — Progress Notes (Signed)
Pt says she has not taken Metformin or Atorvastatin since Nov.  She is going to discuss it further with her PCP at her appointment in Feb.

## 2022-01-16 NOTE — Progress Notes (Addendum)
Gastroenterology Pre-Procedure Review  Request Date: 01/16/2022 Requesting Physician: 5 year recall, Last TCS done 11/01/2016 by Dr. Gala Romney, tubular adenoma, hyperplastic polyp, family hx colon cancer (sister)  PATIENT REVIEW QUESTIONS: The patient responded to the following health history questions as indicated:    1. Diabetes Melitis: yes, borderline type II per pt 2. Joint replacements in the past 12 months: no 3. Major health problems in the past 3 months: yes, bronchitis 2 weeks ago 4. Has an artificial valve or MVP: no 5. Has a defibrillator: no 6. Has been advised in past to take antibiotics in advance of a procedure like teeth cleaning: no 7. Family history of colon cancer: yes, sister: age unknown  14. Alcohol Use: yes, 2 drinks on weekends 9. Illicit drug Use: no 10. History of sleep apnea: no  11. History of coronary artery or other vascular stents placed within the last 12 months: no 12. History of any prior anesthesia complications: no 13. Body mass index is 31.89 kg/m.    MEDICATIONS & ALLERGIES:    Patient reports the following regarding taking any blood thinners:   Plavix? no Aspirin? Yes, 81 mg daily Coumadin?  no Brilinta? no Xarelto? no Eliquis? no Pradaxa? no Savaysa? no Effient? no  Patient confirms/reports the following medications:  Current Outpatient Medications  Medication Sig Dispense Refill   aspirin EC 81 MG tablet Take 1 tablet (81 mg total) by mouth daily. Swallow whole. 30 tablet 11   atorvastatin (LIPITOR) 80 MG tablet Take 1 tablet (80 mg total) by mouth daily. 90 tablet 3   betamethasone dipropionate 0.05 % cream Apply topically 2 (two) times daily. (Patient taking differently: Apply topically as needed.) 15 g 1   Cholecalciferol (VITAMIN D3) 50 MCG (2000 UT) TABS Take by mouth daily at 6 (six) AM.     famotidine (PEPCID) 40 MG tablet TAKE ONE TABLET BY MOUTH EVERTY DAY , AS NEEDED, , FOR HEARTBURN 30 tablet 0   fluticasone (FLONASE) 50 MCG/ACT  nasal spray PLACE 2 SPRAYS INTO BOTH NOSTRILS DAILY AS NEEDED. FOR ALLERGIES 48 mL 1   glipiZIDE (GLUCOTROL XL) 5 MG 24 hr tablet Take 1 tablet (5 mg total) by mouth daily with breakfast. 90 tablet 1   ibuprofen (ADVIL) 800 MG tablet Take 1 tablet (800 mg total) by mouth every 8 (eight) hours as needed. (Patient taking differently: Take 800 mg by mouth as needed.) 30 tablet 0   polyethylene glycol-electrolytes (NULYTELY) 420 g solution Take 4,000 mLs by mouth once for 1 dose. 4000 mL 0   No current facility-administered medications for this visit.    Patient confirms/reports the following allergies:  Allergies  Allergen Reactions   Tessalon Perles [Benzonatate] Dermatitis    rash    No orders of the defined types were placed in this encounter.   AUTHORIZATION INFORMATION Primary Insurance: [facility],  ID #: [id number],  Group #: [id number] [place] Pre-Cert / Josem Kaufmann required: No, not required  SCHEDULE INFORMATION: Procedure has been scheduled as follows:  Date: 03/09/2022, Time: 9:00 Location: [facility] with Dr. Gala Romney  This Gastroenterology Pre-Precedure Review Form is being routed to the following provider(s): Neil Crouch, PA-C

## 2022-01-16 NOTE — Progress Notes (Signed)
Ok to schedule. ASA 2. Conscious sedation ok, or propofol. Day of prep: hold metformin Am of TCS: hold metformin

## 2022-01-17 NOTE — Progress Notes (Signed)
Offered to schedule procedure for [name] but pt declined..  She is aware that it will be more towards Mar/Apr before Dr. Gala Smith can perform colonoscopy.  Pt would like to wait.  She is going on a cruise in Feb so Mar/Apr would work better for her.  Pt made aware that I will call her back closer to then to update everything and schedule.  She voiced understanding.

## 2022-01-17 NOTE — Progress Notes (Signed)
Lmom for pt to call me back. 

## 2022-01-26 DIAGNOSIS — E1169 Type 2 diabetes mellitus with other specified complication: Secondary | ICD-10-CM | POA: Diagnosis not present

## 2022-01-27 ENCOUNTER — Other Ambulatory Visit: Payer: Self-pay

## 2022-01-27 ENCOUNTER — Encounter: Payer: Self-pay | Admitting: Family Medicine

## 2022-01-27 ENCOUNTER — Ambulatory Visit (INDEPENDENT_AMBULATORY_CARE_PROVIDER_SITE_OTHER): Payer: Medicare HMO | Admitting: Family Medicine

## 2022-01-27 VITALS — BP 122/82 | HR 93 | Ht 63.0 in | Wt 185.0 lb

## 2022-01-27 DIAGNOSIS — E1169 Type 2 diabetes mellitus with other specified complication: Secondary | ICD-10-CM

## 2022-01-27 DIAGNOSIS — E785 Hyperlipidemia, unspecified: Secondary | ICD-10-CM | POA: Diagnosis not present

## 2022-01-27 DIAGNOSIS — Z0000 Encounter for general adult medical examination without abnormal findings: Secondary | ICD-10-CM

## 2022-01-27 LAB — BMP8+EGFR
BUN/Creatinine Ratio: 18 (ref 12–28)
BUN: 16 mg/dL (ref 8–27)
CO2: 25 mmol/L (ref 20–29)
Calcium: 9.9 mg/dL (ref 8.7–10.3)
Chloride: 102 mmol/L (ref 96–106)
Creatinine, Ser: 0.88 mg/dL (ref 0.57–1.00)
Glucose: 153 mg/dL — ABNORMAL HIGH (ref 70–99)
Potassium: 4.5 mmol/L (ref 3.5–5.2)
Sodium: 140 mmol/L (ref 134–144)
eGFR: 71 mL/min/{1.73_m2} (ref >59)

## 2022-01-27 LAB — HEMOGLOBIN A1C
Est. average glucose Bld gHb Est-mCnc: 197 mg/dL
Hgb A1c MFr Bld: 8.5 % — ABNORMAL HIGH (ref 4.8–5.6)

## 2022-01-27 MED ORDER — GLIPIZIDE ER 5 MG PO TB24: 5.0000 mg | ORAL_TABLET | Freq: Every day | ORAL | 1 refills | Status: DC

## 2022-01-27 MED ORDER — FAMOTIDINE 40 MG PO TABS: ORAL_TABLET | ORAL | 0 refills | Status: DC

## 2022-01-27 MED ORDER — IBUPROFEN 800 MG PO TABS: 800.0000 mg | ORAL_TABLET | Freq: Three times a day (TID) | ORAL | 0 refills | Status: DC | PRN

## 2022-01-27 MED ORDER — ATORVASTATIN CALCIUM 80 MG PO TABS: 80.0000 mg | ORAL_TABLET | Freq: Every day | ORAL | 3 refills | Status: DC

## 2022-01-27 NOTE — Patient Instructions (Signed)
F/U in 3 months, call if you eed me before  New is glipizide once daily for blood sugar .   Fasting lipid, cmp and EGFR and HBA1C 5 days before next appt  You are referred to pharmacist for help with blood sugar and cholesterol  It is important that you exercise regularly at least 30 minutes 5 times a week. If you develop chest pain, have severe difficulty breathing, or feel very tired, stop exercising immediately and seek medical attention   Thanks for choosing [facility], we consider it a privelige to serve you.

## 2022-01-30 ENCOUNTER — Encounter: Payer: Self-pay | Admitting: Family Medicine

## 2022-01-30 NOTE — Assessment & Plan Note (Signed)

## 2022-01-30 NOTE — Progress Notes (Signed)
Stacey Smith     MRN: [id number]      DOB: [birth year]  HPI: Patient is in for annual physical exam.  Recent labs,  are reviewed.Blood sugar is uncontrolled, states intolerant of metformin so non compliant. Cholesterol also elevated. Med changed and referred to pharmacist Immunization is reviewed , and  updated if needed.   PE: BP 122/82    Pulse 93    Ht 5\' 3"  (1.6 m)    Wt 185 lb 0.6 oz (83.9 kg)    SpO2 94%    BMI 32.78 kg/m   Pleasant  female, alert and oriented x 3, in no cardio-pulmonary distress. Afebrile. HEENT No facial trauma or asymetry. Sinuses non tender.  Extra occullar muscles intact.. External ears normal, . Neck: supple, no adenopathy,JVD or thyromegaly.No bruits.  Chest: Clear to ascultation bilaterally.No crackles or wheezes. Non tender to palpation  Cardiovascular system; Heart sounds normal,  S1 and  S2 ,no S3.  No murmur, or thrill. Apical beat not displaced Peripheral pulses normal.  Abdomen: Soft, non tender.    .   Musculoskeletal exam: Decreased though adequate  ROM of spine, normal in hips , shoulders and knees. No deformity ,swelling or crepitus noted. No muscle wasting or atrophy.   Neurologic: Cranial nerves 2 to 12 intact. Power, tone ,sensation and reflexes normal throughout. No disturbance in gait. No tremor.  Skin: Intact, no ulceration, erythema , scaling or rash noted. Pigmentation normal throughout  Psych; Normal mood and affect. Judgement and concentration normal   Assessment & Plan:  Annual physical exam Annual exam as documented. Counseling done  re healthy lifestyle involving commitment to 150 minutes exercise per week, heart healthy diet, and attaining healthy weight.The importance of adequate sleep also discussed. Regular seat belt use and home safety, is also discussed. Changes in health habits are decided on by the patient with goals and time frames  set for achieving them. Immunization and cancer  screening needs are specifically addressed at this visit.   Type 2 diabetes mellitus with other specified complication [facility]) Stacey Smith is reminded of the importance of commitment to daily physical activity for 30 minutes or more, as able and the need to limit carbohydrate intake to 30 to 60 grams per meal to help with blood sugar control.   The need to take medication as prescribed, test blood sugar as directed, and to call between visits if there is a concern that blood sugar is uncontrolled is also discussed.   Stacey Smith is reminded of the importance of daily foot exam, annual eye examination, and good blood sugar, blood pressure and cholesterol control. Uncontrolled and deteriorated, has not been taking metformin, intolerant, loose stool Start glipizide, refer for education to pharmacist  Diabetic Labs Latest Ref Rng & Units 01/26/2022 06/16/2021 06/13/2021 04/20/2021 03/01/2021  HbA1c 4.8 - 5.6 % 8.5(H) - 7.1(H) - 8.3(H)  Microalbumin mg/dL - - - - -  Micro/Creat Ratio 0 - 29 mg/g creat - 9 - - -  Chol 100 - 199 mg/dL - - 146 - 186  HDL >39 mg/dL - - 50 - 64  Calc LDL 0 - 99 mg/dL - - 73 - 94  Triglycerides 0 - 149 mg/dL - - 132 - 162(H)  Creatinine 0.57 - 1.00 mg/dL 0.88 - 0.83 0.87 0.99   BP/Weight 01/27/2022 01/16/2022 01/01/2022 06/16/2021 04/20/2021 04/05/2021 6/78/9381  Systolic BP 017 - 510 258 527 782 -  Diastolic BP 82 - 86 72 81 86 -  Wt. (Lbs) 185.04 180 - 177.4 178 179.6 182  BMI 32.78 31.89 - 31.42 31.53 31.81 32.24   Foot/eye exam completion dates Latest Ref Rng & Units 12/08/2020 10/15/2020  Eye Exam No Retinopathy - No Retinopathy  Foot Form Completion - Done -

## 2022-01-30 NOTE — Assessment & Plan Note (Signed)
Stacey Smith is reminded of the importance of commitment to daily physical activity for 30 minutes or more, as able and the need to limit carbohydrate intake to 30 to 60 grams per meal to help with blood sugar control.   The need to take medication as prescribed, test blood sugar as directed, and to call between visits if there is a concern that blood sugar is uncontrolled is also discussed.   Stacey Smith is reminded of the importance of daily foot exam, annual eye examination, and good blood sugar, blood pressure and cholesterol control. Uncontrolled and deteriorated, has not been taking metformin, intolerant, loose stool Start glipizide, refer for education to pharmacist  Diabetic Labs Latest Ref Rng & Units 01/26/2022 06/16/2021 06/13/2021 04/20/2021 03/01/2021  HbA1c 4.8 - 5.6 % 8.5(H) - 7.1(H) - 8.3(H)  Microalbumin mg/dL - - - - -  Micro/Creat Ratio 0 - 29 mg/g creat - 9 - - -  Chol 100 - 199 mg/dL - - 146 - 186  HDL >39 mg/dL - - 50 - 64  Calc LDL 0 - 99 mg/dL - - 73 - 94  Triglycerides 0 - 149 mg/dL - - 132 - 162(H)  Creatinine 0.57 - 1.00 mg/dL 0.88 - 0.83 0.87 0.99   BP/Weight 01/27/2022 01/16/2022 01/01/2022 06/16/2021 04/20/2021 04/05/2021 9/60/4540  Systolic BP 981 - 191 478 295 621 -  Diastolic BP 82 - 86 72 81 86 -  Wt. (Lbs) 185.04 180 - 177.4 178 179.6 182  BMI 32.78 31.89 - 31.42 31.53 31.81 32.24   Foot/eye exam completion dates Latest Ref Rng & Units 12/08/2020 10/15/2020  Eye Exam No Retinopathy - No Retinopathy  Foot Form Completion - Done -

## 2022-02-01 ENCOUNTER — Telehealth: Payer: Self-pay | Admitting: *Deleted

## 2022-02-01 NOTE — Chronic Care Management (AMB) (Signed)
°  Chronic Care Management   Outreach Note  02/01/2022 Name: Stacey Smith MRN: [id number] DOB: [birth year]  Stacey Smith is a 71 y.o. year old female who is a primary care patient of Fayrene Helper, MD. I reached out to [name] by phone today in response to a referral sent by Ms. Daylene Posey Bortner's primary care provider.  A telephone outreach was attempted today, patient is going out of town and wishes for a return call in about 2 weeks. The patient was referred to the case management team for assistance with care management and care coordination.   Follow Up Plan: The care management team will reach out to the patient again over the next 14 days.  If patient returns call to provider office, please advise to call [place]* at [place] [facility]  Direct Dial: [phone]

## 2022-02-14 NOTE — Chronic Care Management (AMB) (Signed)
Chronic Care Management   Note  02/14/2022 Name: Stacey Smith MRN: [id number] DOB: 03/26/1951  Stacey Smith is a 71 y.o. year old female who is a primary care patient of Fayrene Helper, MD. I reached out to [name] by phone today in response to a referral sent by Stacey Smith PCP.  Stacey Smith was given information about Chronic Care Management services today including:  CCM service includes personalized support from designated clinical staff supervised by her physician, including individualized plan of care and coordination with other care providers 24/7 contact phone numbers for assistance for urgent and routine care needs. Service will only be billed when office clinical staff spend 20 minutes or more in a month to coordinate care. Only one practitioner may furnish and bill the service in a calendar month. The patient may stop CCM services at any time (effective at the end of the month) by phone call to the office staff. The patient is responsible for co-pay (up to 20% after annual deductible is met) if co-pay is required by the individual health plan.   Patient agreed to services and verbal consent obtained.   Follow up plan: Telephone appointment with care management team member scheduled for:02/22/22  [facility] [facility]  Direct Dial: [phone]

## 2022-02-15 ENCOUNTER — Telehealth: Payer: Medicare HMO | Admitting: Family

## 2022-02-15 DIAGNOSIS — U071 COVID-19: Secondary | ICD-10-CM

## 2022-02-15 MED ORDER — MOLNUPIRAVIR EUA 200MG CAPSULE: 4.0000 | ORAL_CAPSULE | Freq: Two times a day (BID) | ORAL | 0 refills | Status: AC

## 2022-02-15 NOTE — Progress Notes (Signed)
Virtual Visit Consent   Stacey Smith, you are scheduled for a virtual visit with a [facility] provider today.     Just as with appointments in the office, your consent must be obtained to participate.  Your consent will be active for this visit and any virtual visit you may have with one of our providers in the next 365 days.     If you have a MyChart account, a copy of this consent can be sent to you electronically.  All virtual visits are billed to your insurance company just like a traditional visit in the office.    As this is a virtual visit, video technology does not allow for your provider to perform a traditional examination.  This may limit your provider's ability to fully assess your condition.  If your provider identifies any concerns that need to be evaluated in person or the need to arrange testing (such as labs, EKG, etc.), we will make arrangements to do so.     Although advances in technology are sophisticated, we cannot ensure that it will always work on either your end or our end.  If the connection with a video visit is poor, the visit may have to be switched to a telephone visit.  With either a video or telephone visit, we are not always able to ensure that we have a secure connection.     I need to obtain your verbal consent now.   Are you willing to proceed with your visit today?    [name] has provided verbal consent on 02/15/2022 for a virtual visit (video or telephone).   Evelina Dun, FNP   Date: 02/15/2022 10:19 AM   Virtual Visit via Video Note   I, Evelina Dun, connected with  Stacey Smith  ([id number], 10/21/1951) on 02/15/22 at 10:45 AM EST by a video-enabled telemedicine application and verified that I am speaking with the correct person using two identifiers.  Location: Patient: Virtual Visit Location Patient: Home Provider: Virtual Visit Location Provider: Home Office   I discussed the limitations of evaluation and management by telemedicine  and the availability of in person appointments. The patient expressed understanding and agreed to proceed.    History of Present Illness: Stacey Smith is a 71 y.o. who identifies as a female who was assigned female at birth, and is being seen today for [place]. She reports her symptoms started yesterday and tested positive last night.   HPI: Cough This is a new problem. The current episode started yesterday. The problem has been gradually worsening. The problem occurs every few minutes. The cough is Non-productive. Associated symptoms include chills, myalgias, nasal congestion, postnasal drip and wheezing. Pertinent negatives include no ear congestion, ear pain, fever, headaches, sore throat or shortness of breath. She has tried rest and OTC cough suppressant for the symptoms. The treatment provided mild relief.   Problems:  Patient Active Problem List   Diagnosis Date Noted   PCR positive for herpes simplex virus type 2 (HSV-2) DNA 04/24/2021   Moderate major depression ([place]) 04/24/2021   Fatty liver disease, nonalcoholic 82/99/3716   Family history of colon cancer 04/05/2021   Elevated alkaline phosphatase level 07/31/2020   Rash and nonspecific skin eruption 07/26/2020   Neck pain on right side 12/08/2019   Lumbar pain 02/07/2019   Family history of heart disease 12/06/2018   At risk for cardiovascular event 12/01/2018   Annual physical exam 07/12/2015   Vitamin D deficiency 07/12/2015  Lack of physical exercise 15/72/6203   Metabolic syndrome 55/97/4163   Cholelithiasis 01/22/2013   Nodule of right lung 01/22/2013   Lumbar back pain with radiculopathy affecting left lower extremity 01/16/2013   Tubular adenoma 09/07/2011   Osteoarthritis 01/06/2011   Type 2 diabetes mellitus with other specified complication ([place]) 84/53/6468   ALLERGIC RHINITIS, SEASONAL 09/02/2008   Hyperlipidemia associated with type 2 diabetes mellitus ([place]) 05/19/2008   Obesity (BMI 30.0-34.9) 05/19/2008    GERD 05/19/2008    Allergies:  Allergies  Allergen Reactions   Tessalon Perles [Benzonatate] Dermatitis    rash   Medications:  Current Outpatient Medications:    molnupiravir EUA (LAGEVRIO) 200 mg CAPS capsule, Take 4 capsules (800 mg total) by mouth 2 (two) times daily for 5 days., Disp: 40 capsule, Rfl: 0   aspirin EC 81 MG tablet, Take 1 tablet (81 mg total) by mouth daily. Swallow whole., Disp: 30 tablet, Rfl: 11   atorvastatin (LIPITOR) 80 MG tablet, Take 1 tablet (80 mg total) by mouth daily., Disp: 90 tablet, Rfl: 3   betamethasone dipropionate 0.05 % cream, Apply topically 2 (two) times daily. (Patient taking differently: Apply topically as needed.), Disp: 15 g, Rfl: 1   famotidine (PEPCID) 40 MG tablet, Take one tablet by mouth  everty day , as needed, , for heartburn, Disp: 30 tablet, Rfl: 0   fluticasone (FLONASE) 50 MCG/ACT nasal spray, PLACE 2 SPRAYS INTO BOTH NOSTRILS DAILY AS NEEDED. FOR ALLERGIES, Disp: 48 mL, Rfl: 1   glipiZIDE (GLUCOTROL XL) 5 MG 24 hr tablet, Take 1 tablet (5 mg total) by mouth daily with breakfast., Disp: 90 tablet, Rfl: 1   ibuprofen (ADVIL) 800 MG tablet, Take 1 tablet (800 mg total) by mouth every 8 (eight) hours as needed., Disp: 30 tablet, Rfl: 0   Multiple Vitamin (MULTIVITAMIN) tablet, Take 1 tablet by mouth as needed. (Patient not taking: Reported on 01/27/2022), Disp: , Rfl:   Observations/Objective: Patient is well-developed, well-nourished in no acute distress.  Resting comfortably  in bed  Head is normocephalic, atraumatic.  No labored breathing.  Speech is clear and coherent with logical content.  Patient is alert and oriented at baseline.  Nasal congestion   Assessment and Plan: 1. COVID-19 - molnupiravir EUA (LAGEVRIO) 200 mg CAPS capsule; Take 4 capsules (800 mg total) by mouth 2 (two) times daily for 5 days.  Dispense: 40 capsule; Refill: 0  COVID positive, rest, force fluids, tylenol as needed, Quarantine for at least 5 days and  you are fever free, then must wear a mask out in public from day 0-32, report any worsening symptoms such as increased shortness of breath, swelling, or continued high fevers. Possible adverse effects discussed with antivirals.    Follow Up Instructions: I discussed the assessment and treatment plan with the patient. The patient was provided an opportunity to ask questions and all were answered. The patient agreed with the plan and demonstrated an understanding of the instructions.  A copy of instructions were sent to the patient via MyChart unless otherwise noted below.     The patient was advised to call back or seek an in-person evaluation if the symptoms worsen or if the condition fails to improve as anticipated.  Time:  I spent 8 minutes with the patient via telehealth technology discussing the above problems/concerns.    Evelina Dun, FNP

## 2022-02-20 ENCOUNTER — Other Ambulatory Visit: Payer: Self-pay | Admitting: Family Medicine

## 2022-02-22 ENCOUNTER — Ambulatory Visit (INDEPENDENT_AMBULATORY_CARE_PROVIDER_SITE_OTHER): Payer: Medicare HMO | Admitting: Pharmacist

## 2022-02-22 DIAGNOSIS — E1169 Type 2 diabetes mellitus with other specified complication: Secondary | ICD-10-CM

## 2022-02-22 DIAGNOSIS — E785 Hyperlipidemia, unspecified: Secondary | ICD-10-CM

## 2022-02-22 NOTE — Chronic Care Management (AMB) (Signed)
Chronic Care Management Pharmacy Note  02/22/2022 Name:  Stacey Smith MRN:  [id number] DOB:  1951/04/11  Summary: Type 2 Diabetes Uncontrolled; Most recent A1c above goal of <7% per ADA guidelines Current medications: glipizide XL 5 mg by mouth once daily Recent changes: metformin discontinued and glipizide started by primary care provider Intolerances:  metformin (diarrhea) Current meal patterns: 2 meals per day most of the time Breakfast: sausage or bacon with eggs Dinner: Varies but recently had [place]  Beverages: mostly water; has coffee with splenda; patient reports occasional Cranberry juice; patient denies soda and sweet tea intake Snacks: patient denies eating snacks Current exercise: patient reports she walks some but admits she is much less active than before Current glucose readings:  patient reports recent fasting blood glucose mostly 120-130s with outlier of 150 this morning. She reports some hypoglycemia when she was being treated for Covid with Lagevrio  The following education was provided: need to reduce intake of carbohydrates and need for increased exercise Continue current medications as above per primary care provider Instructed to monitor blood sugars once a day at the following times: fasting (at least 8 hours since last food consumption) and whenever patient experiences symptoms of hypo/hyperglycemia  Encouraged regular aerobic exercise with a goal of 30 minutes five times per week (150 minutes per week) Discussed management of hypoglycemia. If blood sugar <70 at any time, treat with simple sugar such as 1/2 cup juice or regular soda or 3-4 glucose tablets. Recheck blood sugar in 15 minutes and repeat if blood sugar remains <70.  Patient may be an ideal candidate for GLP-1 agonist or SGLT-2 inhibitor, but since primary care provider recently changed regimen, patient did not want to discuss medication change today. Would recommend that primary care provider discuss  with patient further at future visits.   Hyperlipidemia Controlled. LDL at goal of <100 due to high risk given at least 2 major CV risk factors (advancing age and diabetes) and 10-year risk 10-20% per 2020 AACE/ACE guidelines. Triglycerides at goal of <150 per 2020 AACE/ACE guidelines. Continue atorvastatin 80 mg by mouth once daily Encourage dietary reduction of high fat containing foods such as butter, nuts, bacon, egg yolks, etc. Recommend regular aerobic exercise Discussed need for medication compliance  Subjective: Stacey Smith is an 71 y.o. year old female who is a primary patient of Stacey Helper, MD.  The CCM team was consulted for assistance with disease management and care coordination needs.    Engaged with patient by telephone for initial visit in response to provider referral for pharmacy case management and/or care coordination services.   Consent to Services:  The patient was given the following information about Chronic Care Management services today, agreed to services, and gave verbal consent: 1. CCM service includes personalized support from designated clinical staff supervised by the primary care provider, including individualized plan of care and coordination with other care providers 2. 24/7 contact phone numbers for assistance for urgent and routine care needs. 3. Service will only be billed when office clinical staff spend 20 minutes or more in a month to coordinate care. 4. Only one practitioner may furnish and bill the service in a calendar month. 5.The patient may stop CCM services at any time (effective at the end of the month) by phone call to the office staff. 6. The patient will be responsible for cost sharing (co-pay) of up to 20% of the service fee (after annual deductible is met). Patient agreed to services  and consent obtained.  Patient Care Team: Stacey Helper, MD as PCP - General Leonie Man, MD as PCP - Cardiology (Cardiology) Daneil Dolin, MD (Gastroenterology) Beryle Lathe, [facility] (Pharmacist)  Objective:  Lab Results  Component Value Date   CREATININE 0.88 01/26/2022   CREATININE 0.83 06/13/2021   CREATININE 0.87 04/20/2021    Lab Results  Component Value Date   HGBA1C 8.5 (H) 01/26/2022   Last diabetic Eye exam:  Lab Results  Component Value Date/Time   HMDIABEYEEXA No Retinopathy 10/15/2020 12:00 AM    Last diabetic Foot exam: No results found for: HMDIABFOOTEX      Component Value Date/Time   CHOL 146 06/13/2021 0934   TRIG 132 06/13/2021 0934   HDL 50 06/13/2021 0934   CHOLHDL 2.9 06/13/2021 0934   CHOLHDL 3.1 11/27/2019 1330   VLDL 33 (H) 03/19/2017 1323   LDLCALC 73 06/13/2021 0934   LDLCALC 86 11/27/2019 1330    Hepatic Function Latest Ref Rng & Units 06/13/2021 03/30/2021 03/01/2021  Total Protein 6.0 - 8.5 g/dL 6.5 6.8 7.1  Albumin 3.8 - 4.8 g/dL 4.1 4.2 4.3  AST 0 - 40 IU/L $Remov'16 16 8  'bFDVGN$ ALT 0 - 32 IU/L $Remov'15 18 15  'sDJTad$ Alk Phosphatase 44 - 121 IU/L 146(H) 151(H) 180(H)  Total Bilirubin 0.0 - 1.2 mg/dL 0.7 0.5 0.5  Bilirubin, Direct 0.00 - 0.40 mg/dL - 0.16 -    Lab Results  Component Value Date/Time   TSH 1.560 12/03/2020 09:39 AM   TSH 1.56 11/27/2019 01:30 PM    CBC Latest Ref Rng & Units 12/03/2020 11/27/2019 11/25/2018  WBC 3.4 - 10.8 x10E3/uL 7.3 7.9 10.7  Hemoglobin 11.1 - 15.9 g/dL 11.6 12.2 12.1  Hematocrit 34.0 - 46.6 % 37.4 40.0 39.7  Platelets 150 - 450 x10E3/uL 283 251 208    Lab Results  Component Value Date/Time   VD25OH 22.2 (L) 12/03/2020 09:39 AM   VD25OH 34 11/27/2019 01:30 PM    Clinical ASCVD: No  The 10-year ASCVD risk score (Arnett DK, et al., 2019) is: 16.9%   Values used to calculate the score:     Age: 44 years     Sex: Female     Is Non-Hispanic African American: Yes     Diabetic: Yes     Tobacco smoker: No     Systolic Blood Pressure: 865 mmHg     Is BP treated: No     HDL Cholesterol: 50 mg/dL     Total Cholesterol: 146 mg/dL     Social  History   Tobacco Use  Smoking Status Never  Smokeless Tobacco Never   BP Readings from Last 3 Encounters:  01/27/22 122/82  01/01/22 132/86  06/16/21 113/72   Pulse Readings from Last 3 Encounters:  01/27/22 93  01/01/22 79  06/16/21 96   Wt Readings from Last 3 Encounters:  01/27/22 185 lb 0.6 oz (83.9 kg)  01/16/22 180 lb (81.6 kg)  06/16/21 177 lb 6.4 oz (80.5 kg)    Assessment: Review of patient past medical history, allergies, medications, health status, including review of consultants reports, laboratory and other test data, was performed as part of comprehensive evaluation and provision of chronic care management services.   SDOH:  (Social Determinants of Health) assessments and interventions performed:    CCM Care Plan  Allergies  Allergen Reactions   Metformin And Related Other (See Comments)    diarrhea   Tessalon Perles [Benzonatate] Dermatitis    rash  Medications Reviewed Today     Reviewed by Beryle Lathe, [facility] (Pharmacist) on 02/22/22 at 1038  Med List Status: <None>   Medication Order Taking? Sig Documenting Provider Last Dose Status Informant  aspirin EC 81 MG tablet [id number] Yes Take 1 tablet (81 mg total) by mouth daily. Swallow whole. Stacey Helper, MD Taking Active   atorvastatin (LIPITOR) 80 MG tablet [id number] Yes Take 1 tablet (80 mg total) by mouth daily. Stacey Helper, MD Taking Active   betamethasone dipropionate 0.05 % cream [id number] Yes Apply topically 2 (two) times daily.  Patient taking differently: Apply topically as needed.   Stacey Helper, MD Taking Active   famotidine (PEPCID) 40 MG tablet [id number] Yes TAKE ONE TABLET BY MOUTH EVERTY DAY , AS NEEDED, , FOR HEARTBURN Stacey Helper, MD Taking Active   fluticasone (FLONASE) 50 MCG/ACT nasal spray [id number] Yes PLACE 2 SPRAYS INTO BOTH NOSTRILS DAILY AS NEEDED. FOR ALLERGIES Stacey Helper, MD Taking Active   glipiZIDE (GLUCOTROL XL) 5 MG 24 hr  tablet [id number] Yes Take 1 tablet (5 mg total) by mouth daily with breakfast. Stacey Helper, MD Taking Active   ibuprofen (ADVIL) 800 MG tablet [id number] Yes Take 1 tablet (800 mg total) by mouth every 8 (eight) hours as needed. Stacey Helper, MD Taking Active   [facility] VERIO test strip [id number] Yes 1 each by Other route daily. [provider] Taking Active Self            Patient Active Problem List   Diagnosis Date Noted   PCR positive for herpes simplex virus type 2 (HSV-2) DNA 04/24/2021   Moderate major depression ([place]) 04/24/2021   Fatty liver disease, nonalcoholic 58/83/2549   Family history of colon cancer 04/05/2021   Elevated alkaline phosphatase level 07/31/2020   Rash and nonspecific skin eruption 07/26/2020   Neck pain on right side 12/08/2019   Lumbar pain 02/07/2019   Family history of heart disease 12/06/2018   At risk for cardiovascular event 12/01/2018   Annual physical exam 07/12/2015   Vitamin D deficiency 07/12/2015   Lack of physical exercise 82/64/1583   Metabolic syndrome 09/40/7680   Cholelithiasis 01/22/2013   Nodule of right lung 01/22/2013   Lumbar back pain with radiculopathy affecting left lower extremity 01/16/2013   Tubular adenoma 09/07/2011   Osteoarthritis 01/06/2011   Type 2 diabetes mellitus with other specified complication ([place]) 88/10/314   ALLERGIC RHINITIS, SEASONAL 09/02/2008   Hyperlipidemia associated with type 2 diabetes mellitus ([place]) 05/19/2008   Obesity (BMI 30.0-34.9) 05/19/2008   GERD 05/19/2008    Immunization History  Administered Date(s) Administered   Fluad Quad(high Dose 65+) 09/08/2019, 10/04/2021   Influenza Split 11/23/2011, 10/23/2014   Influenza Whole 01/30/2008, 09/27/2010, 09/24/2012   Influenza, High Dose Seasonal PF 11/06/2018   Influenza,inj,Quad PF,6+ Mos 11/03/2014, 09/10/2015, 08/25/2016, 11/05/2017, 08/19/2020   Influenza,inj,quad, With Preservative 10/25/2017   PFIZER  Comirnaty(Gray Top)Covid-19 Tri-Sucrose Vaccine 05/10/2021   PFIZER(Purple Top)SARS-COV-2 Vaccination 01/22/2020, 02/13/2020, 09/20/2020   Pfizer Covid-19 Vaccine Bivalent Booster 51yrs & up 10/04/2021   Pneumococcal Conjugate PCV 7 10/25/2017   Pneumococcal Conjugate-13 03/04/2015, 11/01/2017   Pneumococcal Polysaccharide-23 11/25/2018   Pneumococcal-Unspecified 10/25/2017   Td 05/24/2004   Tdap 07/01/2014   Zoster Recombinat (Shingrix) 10/25/2021, 01/26/2022   Zoster, Live 01/16/2013    Conditions to be addressed/monitored: HLD and DMII  Care Plan : Medication Management  Updates made by Beryle Lathe, [place] since 02/22/2022 12:00 AM     Problem:  T2DM & HLD   Priority: High  Onset Date: 02/22/2022     Long-Range Goal: Disease Progression Prevention   Start Date: 02/22/2022  Expected End Date: 05/23/2022  This Visit's Progress: On track  Priority: High  Note:   Current Barriers:  Unable to independently monitor therapeutic efficacy Unable to achieve control of diabetes Suboptimal therapeutic regimen for diabetes  Pharmacist Clinical Goal(s):  Through collaboration with PharmD and provider, patient will  Achieve adherence to monitoring guidelines and medication adherence to achieve therapeutic efficacy Achieve control of diabetes as evidenced by improved A1c Adhere to plan to optimize therapeutic regimen for diabetes as evidenced by report of adherence to recommended medication management changes   Interventions: 1:1 collaboration with Stacey Helper, MD regarding development and update of comprehensive plan of care as evidenced by provider attestation and co-signature Inter-disciplinary care team collaboration (see longitudinal plan of care) Comprehensive medication review performed; medication list updated in electronic medical record  Health Maintenance  Topic Date Due   COLONOSCOPY (Pts 45-34yrs Insurance coverage will need to be confirmed)  11/01/2021    URINE MICROALBUMIN  06/16/2022   HEMOGLOBIN A1C  07/26/2022   OPHTHALMOLOGY EXAM  10/12/2022   FOOT EXAM  01/30/2023   MAMMOGRAM  07/26/2023   TETANUS/TDAP  07/01/2024   Pneumonia Vaccine 75+ Years old  Completed   INFLUENZA VACCINE  Completed   DEXA SCAN  Completed   COVID-19 Vaccine  Completed   Hepatitis C Screening  Completed   Zoster Vaccines- Shingrix  Completed   HPV VACCINES  Aged Out    Health Maintenance Due  Topic Date Due   COLONOSCOPY (Pts 45-37yrs Insurance coverage will need to be confirmed)  11/01/2021   Type 2 Diabetes - New goal.: Uncontrolled; Most recent A1c above goal of <7% per ADA guidelines Current medications: glipizide XL 5 mg by mouth once daily Recent changes: metformin discontinued and glipizide started by primary care provider Intolerances:  metformin (diarrhea) Taking medications as directed: yes Side effects thought to be attributed to current medication regimen: no Current meal patterns: 2 meals per day most of the time Breakfast: sausage or bacon with eggs Dinner: Varies but recently had [place]  Beverages: mostly water; has coffee with splenda; patient reports occasional Cranberry juice; patient denies soda and sweet tea intake Snacks: patient denies eating snacks Current exercise:  patient reports she walks some but admits she is much less active than before On a statin: yes On aspirin 81 mg daily: yes Last microalbumin/creatinine ratio: 9; on an ACEi/ARB: no Current glucose readings:  patient reports recent fasting blood glucose mostly 120-130s with outlier of 150 this morning. She reports some hypoglycemia when she was being treated for Covid with Lagevrio  The following education was provided: need to reduce intake of carbohydrates and need for increased exercise Continue current medications as above per primary care provider Instructed to monitor blood sugars once a day at the following times: fasting (at least 8 hours since last food  consumption) and whenever patient experiences symptoms of hypo/hyperglycemia  Encouraged regular aerobic exercise with a goal of 30 minutes five times per week (150 minutes per week) Discussed management of hypoglycemia. If blood sugar <70 at any time, treat with simple sugar such as 1/2 cup juice or regular soda or 3-4 glucose tablets. Recheck blood sugar in 15 minutes and repeat if blood sugar remains <70.  Patient may be an ideal candidate for GLP-1 agonist or SGLT-2 inhibitor, but since primary care provider recently changed  regimen, patient did not want to discuss medication change today. Would recommend that primary care provider discuss with patient further at future visits.   Hyperlipidemia - New goal.: Controlled. LDL at goal of <100 due to high risk given at least 2 major CV risk factors (advancing age and diabetes) and 10-year risk 10-20% per 2020 AACE/ACE guidelines. Triglycerides at goal of <150 per 2020 AACE/ACE guidelines. Current medications: atorvastatin 80 mg by mouth once daily Intolerances: none Taking medications as directed: yes Side effects thought to be attributed to current medication regimen: no Continue atorvastatin 80 mg by mouth once daily Encourage dietary reduction of high fat containing foods such as butter, nuts, bacon, egg yolks, etc. Recommend regular aerobic exercise Discussed need for medication compliance  Patient Goals/Self-Care Activities Patient will:  Focus on medication adherence by keeping up with prescription refills and either using a pill box or reminders to take your medications at the prescribed times Check blood sugar once a day at the following times: fasting (at least 8 hours since last food consumption) and whenever patient experiences symptoms of hypo/hyperglycemia, document, and provide at future appointments Target a minimum of 150 minutes of moderate intensity exercise weekly Engage in dietary modifications by less frequent dining out and  fewer sweetened foods & beverages  Follow Up Plan: Next PCP appointment scheduled for: 04/26/22      Medication Assistance: None required.  Patient affirms current coverage meets needs.  Patient's preferred pharmacy is:  [id number] [address], [place] [place] [address] Phone: [phone] Fax: [phone]  [id number] [place], [place] [address] [place] [address] Phone: [phone] Fax: [phone]  Follow Up:  Patient agrees to Care Plan and Follow-up.  Plan: Next PCP appointment scheduled for: 04/26/22  Kennon Holter, PharmD, [place], CPP Clinical Pharmacist Practitioner [facility] [phone]

## 2022-02-22 NOTE — Patient Instructions (Signed)
Stacey Smith, ? ?It was great to talk to you today! ? ?Please call me with any questions or concerns. ? ?Visit Information  ? ?Following is a copy of your full care plan:  ?Care Plan : Medication Management  ?Updates made by Beryle Lathe, [place] since 02/22/2022 12:00 AM  ?  ? ?Problem: T2DM & HLD   ?Priority: High  ?Onset Date: 02/22/2022  ?  ? ?Long-Range Goal: Disease Progression Prevention   ?Start Date: 02/22/2022  ?Expected End Date: 05/23/2022  ?This Visit's Progress: On track  ?Priority: High  ?Note:   ?Current Barriers:  ?Unable to independently monitor therapeutic efficacy ?Unable to achieve control of diabetes ?Suboptimal therapeutic regimen for diabetes ? ?Pharmacist Clinical Goal(s):  ?Through collaboration with PharmD and provider, patient will  ?Achieve adherence to monitoring guidelines and medication adherence to achieve therapeutic efficacy ?Achieve control of diabetes as evidenced by improved A1c ?Adhere to plan to optimize therapeutic regimen for diabetes as evidenced by report of adherence to recommended medication management changes  ? ?Interventions: ?1:1 collaboration with Fayrene Helper, MD regarding development and update of comprehensive plan of care as evidenced by provider attestation and co-signature ?Inter-disciplinary care team collaboration (see longitudinal plan of care) ?Comprehensive medication review performed; medication list updated in electronic medical record ? ?Health Maintenance  ?Topic Date Due  ? COLONOSCOPY (Pts 45-81yrs Insurance coverage will need to be confirmed)  11/01/2021  ? URINE MICROALBUMIN  06/16/2022  ? HEMOGLOBIN A1C  07/26/2022  ? OPHTHALMOLOGY EXAM  10/12/2022  ? FOOT EXAM  01/30/2023  ? MAMMOGRAM  07/26/2023  ? TETANUS/TDAP  07/01/2024  ? Pneumonia Vaccine 25+ Years old  Completed  ? INFLUENZA VACCINE  Completed  ? DEXA SCAN  Completed  ? COVID-19 Vaccine  Completed  ? Hepatitis C Screening  Completed  ? Zoster Vaccines- Shingrix  Completed  ? HPV  VACCINES  Aged Out  ?  ?Health Maintenance Due  ?Topic Date Due  ? COLONOSCOPY (Pts 45-80yrs Insurance coverage will need to be confirmed)  11/01/2021  ? ?Type 2 Diabetes - New goal.: ?Uncontrolled; Most recent A1c above goal of <7% per ADA guidelines ?Current medications: glipizide XL 5 mg by mouth once daily ?Recent changes: metformin discontinued and glipizide started by primary care provider ?Intolerances:  metformin (diarrhea) ?Taking medications as directed: yes ?Side effects thought to be attributed to current medication regimen: no ?Current meal patterns: 2 meals per day most of the time ?Breakfast: sausage or bacon with eggs ?Dinner: Varies but recently had [place]  ?Beverages: mostly water; has coffee with splenda; patient reports occasional Cranberry juice; patient denies soda and sweet tea intake ?Snacks: patient denies eating snacks ?Current exercise:  patient reports she walks some but admits she is much less active than before ?On a statin: yes ?On aspirin 81 mg daily: yes ?Last microalbumin/creatinine ratio: 9; on an ACEi/ARB: no ?Current glucose readings:  patient reports recent fasting blood glucose mostly 120-130s with outlier of 150 this morning. She reports some hypoglycemia when she was being treated for Covid with Lagevrio  ?The following education was provided: need to reduce intake of carbohydrates and need for increased exercise ?Continue current medications as above per primary care provider ?Instructed to monitor blood sugars once a day at the following times: fasting (at least 8 hours since last food consumption) and whenever patient experiences symptoms of hypo/hyperglycemia  ?Encouraged regular aerobic exercise with a goal of 30 minutes five times per week (150 minutes per week) ?Discussed management of  hypoglycemia. If blood sugar <70 at any time, treat with simple sugar such as 1/2 cup juice or regular soda or 3-4 glucose tablets. Recheck blood sugar in 15 minutes and repeat if blood  sugar remains <70.  ?Patient may be an ideal candidate for GLP-1 agonist or SGLT-2 inhibitor, but since primary care provider recently changed regimen, patient did not want to discuss medication change today. Would recommend that primary care provider discuss with patient further at future visits.  ? ?Hyperlipidemia - New goal.: ?Controlled. LDL at goal of <100 due to high risk given at least 2 major CV risk factors (advancing age and diabetes) and 10-year risk 10-20% per 2020 AACE/ACE guidelines. Triglycerides at goal of <150 per 2020 AACE/ACE guidelines. ?Current medications: atorvastatin 80 mg by mouth once daily ?Intolerances: none ?Taking medications as directed: yes ?Side effects thought to be attributed to current medication regimen: no ?Continue atorvastatin 80 mg by mouth once daily ?Encourage dietary reduction of high fat containing foods such as butter, nuts, bacon, egg yolks, etc. ?Recommend regular aerobic exercise ?Discussed need for medication compliance ? ?Patient Goals/Self-Care Activities ?Patient will:  ?Focus on medication adherence by keeping up with prescription refills and either using a pill box or reminders to take your medications at the prescribed times ?Check blood sugar once a day at the following times: fasting (at least 8 hours since last food consumption) and whenever patient experiences symptoms of hypo/hyperglycemia, document, and provide at future appointments ?Target a minimum of 150 minutes of moderate intensity exercise weekly ?Engage in dietary modifications by less frequent dining out and fewer sweetened foods & beverages ? ?Follow Up Plan: Next PCP appointment scheduled for: 04/26/22 ? ?  ? ? ?Consent to CCM Services: ?Stacey Smith was given information about Chronic Care Management services including:  ?CCM service includes personalized support from designated clinical staff supervised by her physician, including individualized plan of care and coordination with other care  providers ?24/7 contact phone numbers for assistance for urgent and routine care needs. ?Service will only be billed when office clinical staff spend 20 minutes or more in a month to coordinate care. ?Only one practitioner may furnish and bill the service in a calendar month. ?The patient may stop CCM services at any time (effective at the end of the month) by phone call to the office staff. ?The patient will be responsible for cost sharing (co-pay) of up to 20% of the service fee (after annual deductible is met). ? ?Patient agreed to services and verbal consent obtained.  ? ?Plan: Next PCP appointment scheduled for: 04/26/22 ? ?Patient verbalizes understanding of instructions and care plan provided today and agrees to view in [place]. Active MyChart status confirmed with patient.   ? ?Please call the care guide team at [phone] if you need to cancel or reschedule your appointment.  ? ?Kennon Holter, PharmD, BCACP, CPP ?Clinical Pharmacist Practitioner ?[place] ?[phone]  ?

## 2022-02-23 ENCOUNTER — Encounter: Payer: Self-pay | Admitting: *Deleted

## 2022-02-23 MED ORDER — PEG 3350-KCL-NA BICARB-NACL 420 G PO SOLR: 4000.0000 mL | Freq: Once | ORAL | 0 refills | Status: AC

## 2022-02-23 NOTE — Progress Notes (Signed)
Lmom for pt to call me back. 

## 2022-02-23 NOTE — Progress Notes (Signed)
Spoke to pt today.  Updated med list.  She no longer takes Metformin.  She takes Glipizide 5 mg daily.  She informed me that she has went back on Atorvastatin 80 mg daily.  No changes in medical history per pt.  Scheduled procedure for [facility]/09/2022 at [facility] with arrival at 7:30.  Reviewed prep instructions with pt.  Pt informed that I will mail out prep instructions including how provider would like her diabetes medication adjusted.  Confirmed mailing address.  Neil Crouch:  How would you like diabetes medication adjusted?

## 2022-02-23 NOTE — Progress Notes (Signed)
Hold glipizide day of prep and am of colonoscopy.

## 2022-02-23 NOTE — Addendum Note (Signed)
Addended by: Metro Kung on: 02/23/2022 08:56 AM   Modules accepted: Orders

## 2022-02-24 ENCOUNTER — Encounter: Payer: Self-pay | Admitting: *Deleted

## 2022-02-24 NOTE — Progress Notes (Signed)
Spoke to pt.  Made her aware of how to adjust her diabetes medications.

## 2022-03-09 ENCOUNTER — Encounter (HOSPITAL_COMMUNITY)
Admission: RE | Disposition: A | Discharge status: Home / Self Care | Payer: Self-pay | Source: Home / Self Care | Attending: Internal Medicine

## 2022-03-09 ENCOUNTER — Encounter (HOSPITAL_COMMUNITY): Payer: Self-pay | Admitting: Internal Medicine

## 2022-03-09 ENCOUNTER — Ambulatory Visit (HOSPITAL_COMMUNITY): Payer: Medicare HMO | Admitting: Anesthesiology

## 2022-03-09 ENCOUNTER — Ambulatory Visit (HOSPITAL_COMMUNITY)
Admission: RE | Admit: 2022-03-09 | Discharge: 2022-03-09 | Disposition: A | Discharge status: Home / Self Care | Payer: Medicare HMO | Attending: Internal Medicine | Admitting: Internal Medicine

## 2022-03-09 ENCOUNTER — Ambulatory Visit (HOSPITAL_BASED_OUTPATIENT_CLINIC_OR_DEPARTMENT_OTHER): Payer: Medicare HMO | Admitting: Anesthesiology

## 2022-03-09 ENCOUNTER — Other Ambulatory Visit: Payer: Self-pay

## 2022-03-09 DIAGNOSIS — K5730 Diverticulosis of large intestine without perforation or abscess without bleeding: Secondary | ICD-10-CM | POA: Insufficient documentation

## 2022-03-09 DIAGNOSIS — R69 Illness, unspecified: Secondary | ICD-10-CM | POA: Diagnosis not present

## 2022-03-09 DIAGNOSIS — K219 Gastro-esophageal reflux disease without esophagitis: Secondary | ICD-10-CM | POA: Insufficient documentation

## 2022-03-09 DIAGNOSIS — Z6832 Body mass index (BMI) 32.0-32.9, adult: Secondary | ICD-10-CM | POA: Insufficient documentation

## 2022-03-09 DIAGNOSIS — D123 Benign neoplasm of transverse colon: Secondary | ICD-10-CM | POA: Diagnosis not present

## 2022-03-09 DIAGNOSIS — F32A Depression, unspecified: Secondary | ICD-10-CM | POA: Diagnosis not present

## 2022-03-09 DIAGNOSIS — K635 Polyp of colon: Secondary | ICD-10-CM | POA: Diagnosis not present

## 2022-03-09 DIAGNOSIS — Z800 Family history of malignant neoplasm of digestive organs: Secondary | ICD-10-CM | POA: Diagnosis not present

## 2022-03-09 DIAGNOSIS — Z86010 Personal history of colonic polyps: Secondary | ICD-10-CM | POA: Diagnosis not present

## 2022-03-09 DIAGNOSIS — Z1211 Encounter for screening for malignant neoplasm of colon: Secondary | ICD-10-CM | POA: Diagnosis not present

## 2022-03-09 DIAGNOSIS — E119 Type 2 diabetes mellitus without complications: Secondary | ICD-10-CM | POA: Insufficient documentation

## 2022-03-09 DIAGNOSIS — D124 Benign neoplasm of descending colon: Secondary | ICD-10-CM | POA: Diagnosis not present

## 2022-03-09 DIAGNOSIS — G709 Myoneural disorder, unspecified: Secondary | ICD-10-CM | POA: Diagnosis not present

## 2022-03-09 HISTORY — PX: POLYPECTOMY: SHX5525

## 2022-03-09 HISTORY — PX: COLONOSCOPY WITH PROPOFOL: SHX5780

## 2022-03-09 LAB — GLUCOSE, CAPILLARY: Glucose-Capillary: 140 mg/dL — ABNORMAL HIGH (ref 70–99)

## 2022-03-09 SURGERY — COLONOSCOPY WITH PROPOFOL
Anesthesia: General

## 2022-03-09 MED ORDER — LACTATED RINGERS IV SOLN: INTRAVENOUS | Status: DC

## 2022-03-09 MED ORDER — LIDOCAINE HCL (CARDIAC) PF 100 MG/5ML IV SOSY
PREFILLED_SYRINGE | INTRAVENOUS | Status: DC | PRN
  Administered 2022-03-09: 50 mg via INTRAVENOUS

## 2022-03-09 MED ORDER — PROPOFOL 500 MG/50ML IV EMUL
INTRAVENOUS | Status: DC | PRN
  Administered 2022-03-09: 100 ug/kg/min via INTRAVENOUS

## 2022-03-09 MED ORDER — PROPOFOL 10 MG/ML IV BOLUS
INTRAVENOUS | Status: DC | PRN
  Administered 2022-03-09 (×2): 50 mg via INTRAVENOUS
  Administered 2022-03-09: 100 mg via INTRAVENOUS

## 2022-03-09 NOTE — Transfer of Care (Signed)
Immediate Anesthesia Transfer of Care Note ? ?Patient: Stacey Smith ? ?Procedure(s) Performed: COLONOSCOPY WITH PROPOFOL ?POLYPECTOMY ? ?Patient Location: Endoscopy Unit ? ?Anesthesia Type:General ? ?Level of Consciousness: drowsy ? ?Airway & Oxygen Therapy: Patient Spontanous Breathing ? ?Post-op Assessment: Report given to RN and Post -op Vital signs reviewed and stable ? ?Post vital signs: Reviewed and stable ? ?Last Vitals:  ?Vitals Value Taken Time  ?BP    ?Temp    ?Pulse    ?Resp    ?SpO2    ? ? ?Last Pain:  ?Vitals:  ? 03/09/22 0825  ?TempSrc:   ?PainSc: 0-No pain  ?   ? ?  ? ?Complications: No notable events documented. ?

## 2022-03-09 NOTE — H&P (Signed)
$'@LOGO'K$ @ ? ? ?Primary Care Physician:  Fayrene Helper, MD ?Primary Gastroenterologist:  Dr. Gala Romney ? ?Pre-Procedure History & Physical: ?HPI:  Stacey Smith is a 71 y.o. female here for surveillance colonoscopy.  History multiple colonic polyps removed over time last colonoscopy 2017; positive family history colon cancer patient's sister. ?No bowel symptoms.  No significant intercurrent illnesses since last seen here. ? ?Past Medical History:  ?Diagnosis Date  ? Allergy   ? seasonal intermittently  ? Arthritis   ? spine  ? Back pain   ? Colon polyps   ? Diabetes mellitus without complication ([place])   ? GERD (gastroesophageal reflux disease)   ? Headache(784.0)   ? Hyperlipidemia   ? well controlled  ? Obesity   ? S/P colonoscopy Oct 2007  ? Normal rectum, diminutive polyps in the left colon cold: TUBULAR ADENOMA  ? TMJ (dislocation of temporomandibular joint)   ? ? ?Past Surgical History:  ?Procedure Laterality Date  ? ABDOMINAL HYSTERECTOMY N/A   ? [employer]rees[employer] 03/04/2021  ? COLONOSCOPY  10/03/2011  ? Procedure: COLONOSCOPY;  Surgeon: Daneil Dolin, MD;  [facility];  Service: Endoscopy;  Laterality: N/A;  10:30  ? COLONOSCOPY N/A 11/01/2016  ? Aedyn Mckeon: Diverticulosis, multiple colon polyps removed, tubular adenomas.  Next colonoscopy in November 2022.  ? CORONARY CT ANGIOGRAM  12/2018  ? Coronary calcium score of 0. This was 0 percentile for age & sex matched control. Normal coronary origin with right dominance. No evidence of CAD.   ? excision of cyst for thyroid gland non cancer  1988  ? POLYPECTOMY  11/01/2016  ? Procedure: POLYPECTOMY;  Surgeon: Daneil Dolin, MD;  Location: [facility];  Service: Endoscopy;;  colon ?  ? TMJ ARTHROPLASTY  09/23/2012  ? Procedure: TEMPOROMANDIBULAR JOINT (TMJ) ARTHROPLASTY;  Surgeon: Gae Bon, DDS;  Location: [place];  Service: Oral Surgery;  Laterality: Left;  Temporomandibular joint arthrotomy and meniscectomy  ? TONSILLECTOMY  1960  ? TOTAL ABDOMINAL HYSTERECTOMY   1985  ? right ovary fibroids   ? TUBAL LIGATION N/A   ? [employer]rees[employer] 03/04/2021  ? ? ?Prior to Admission medications   ?Medication Sig Start Date End Date Taking? Authorizing Provider  ?aspirin EC 81 MG tablet Take 1 tablet (81 mg total) by mouth daily. Swallow whole. 07/26/20  Yes Fayrene Helper, MD  ?atorvastatin (LIPITOR) 80 MG tablet Take 1 tablet (80 mg total) by mouth daily. 01/27/22  Yes Fayrene Helper, MD  ?betamethasone dipropionate 0.05 % cream Apply topically 2 (two) times daily. ?Patient taking differently: Apply 1 application. topically daily as needed (irritation). 04/25/21  Yes Fayrene Helper, MD  ?Cholecalciferol (VITAMIN D3) 50 MCG (2000 UT) TABS Take 2,000 Units by mouth daily.   Yes [provider]  ?famotidine (PEPCID) 40 MG tablet TAKE ONE TABLET BY MOUTH EVERTY DAY , AS NEEDED, , FOR HEARTBURN ?Patient taking differently: Take 40 mg by mouth daily. 02/20/22  Yes Fayrene Helper, MD  ?fluticasone (FLONASE) 50 MCG/ACT nasal spray PLACE 2 SPRAYS INTO BOTH NOSTRILS DAILY AS NEEDED. FOR ALLERGIES 02/10/21  Yes Fayrene Helper, MD  ?glipiZIDE (GLUCOTROL XL) 5 MG 24 hr tablet Take 1 tablet (5 mg total) by mouth daily with breakfast. 01/27/22  Yes Fayrene Helper, MD  ?ibuprofen (ADVIL) 800 MG tablet Take 1 tablet (800 mg total) by mouth every 8 (eight) hours as needed. 01/27/22  Yes Fayrene Helper, MD  ? ? ?Allergies as of 02/23/2022 - Review  Complete 02/22/2022  ?Allergen Reaction Noted  ? Metformin and related Other (See Comments) 11/03/2014  ? Tessalon perles [benzonatate] Dermatitis 07/12/2015  ? ? ?Family History  ?Problem Relation Age of Onset  ? Stroke Mother   ? Hypertension Mother   ? Cancer Mother   ?     breast   ? Glaucoma Mother   ? Asthma Mother   ? Coronary artery disease Father   ? Diabetes Father   ? Heart failure Father   ? Kidney disease Father   ? Diabetes Sister   ? Hypertension Sister   ? Diabetes Sister   ? Diabetes Sister   ? Depression Sister   ?  Colon cancer Sister   ? Cancer - Colon Sister   ?     colon   ? Thyroid disease Sister   ? Bipolar disorder Sister   ? ? ?Social History  ? ?Socioeconomic History  ? Marital status: Divorced  ?  Spouse name: Not on file  ? Number of children: 2  ? Years of education: Not on file  ? Highest education level: Not on file  ?Occupational History  ? Occupation: postal service  ?  Employer: [employer]  ?  Comment: [employer]  ?Tobacco Use  ? Smoking status: Never  ? Smokeless tobacco: Never  ?Vaping Use  ? Vaping Use: Never used  ?Substance and Sexual Activity  ? Alcohol use: Yes  ?  Comment: occasionally   ? Drug use: No  ? Sexual activity: Not Currently  ?Other Topics Concern  ? Not on file  ?Social History Narrative  ? Recently lost her long-term partner (2019)  ? ?Social Determinants of Health  ? ?Financial Resource Strain: Low Risk   ? Difficulty of Paying Living Expenses: Not hard at all  ?Food Insecurity: No Food Insecurity  ? Worried About Charity fundraiser in the Last Year: Never true  ? Ran Out of Food in the Last Year: Never true  ?Transportation Needs: No Transportation Needs  ? Lack of Transportation (Medical): No  ? Lack of Transportation (Non-Medical): No  ?Physical Activity: Inactive  ? Days of Exercise per Week: 0 days  ? Minutes of Exercise per Session: 0 min  ?Stress: No Stress Concern Present  ? Feeling of Stress : Not at all  ?Social Connections: Moderately Integrated  ? Frequency of Communication with Friends and Family: More than three times a week  ? Frequency of Social Gatherings with Friends and Family: More than three times a week  ? Attends Religious Services: More than 4 times per year  ? Active Member of Clubs or Organizations: Yes  ? Attends Archivist Meetings: More than 4 times per year  ? Marital Status: Divorced  ?Intimate Partner Violence: Not At Risk  ? Fear of Current or Ex-Partner: No  ? Emotionally Abused: No  ? Physically Abused: No  ? Sexually Abused: No   ? ? ?Review of Systems: ?See HPI, otherwise negative ROS ? ?Physical Exam: ?BP (!) 143/80   Pulse 82   Temp 97.7 ?F (36.5 ?C) (Oral)   Resp 18   Ht '5\' 3"'$  (1.6 m)   Wt 83.9 kg   SpO2 98%   BMI 32.77 kg/m?  ?General:   Alert,  Well-developed, well-nourished, pleasant and cooperative in NAD ?Neck:  Supple; no masses or thyromegaly. No significant cervical adenopathy. ?Lungs:  Clear throughout to auscultation.   No wheezes, crackles, or rhonchi. No acute distress. ?Heart:  Regular  rate and rhythm; no murmurs, clicks, rubs,  or gallops. ?Abdomen: Non-distended, normal bowel sounds.  Soft and nontender without appreciable mass or hepatosplenomegaly.  ?Pulses:  Normal pulses noted. ?Extremities:  Without clubbing or edema. ? ?Impression/Plan: 71 year old lady with a history of multiple colonic adenomas removed over time.  Positive family history colon cancer in her sister.  Patient is here for surveillance colonoscopy.  I have offered the patient a surveillance colonoscopy per plan. ?The risks, benefits, limitations, alternatives and imponderables have been reviewed with the patient. Questions have been answered. All parties are agreeable.   ? ? ? ? ?Notice: This dictation was prepared with Dragon dictation along with smaller phrase technology. Any transcriptional errors that result from this process are unintentional and may not be corrected upon review.  ? ?

## 2022-03-09 NOTE — Anesthesia Postprocedure Evaluation (Signed)
Anesthesia Post Note ? ?Patient: Stacey Smith ? ?Procedure(s) Performed: COLONOSCOPY WITH PROPOFOL ?POLYPECTOMY ? ?Patient location during evaluation: Phase II ?Anesthesia Type: General ?Level of consciousness: awake ?Pain management: pain level controlled ?Vital Signs Assessment: post-procedure vital signs reviewed and stable ?Respiratory status: spontaneous breathing and respiratory function stable ?Cardiovascular status: blood pressure returned to baseline and stable ?Postop Assessment: no headache and no apparent nausea or vomiting ?Anesthetic complications: no ?Comments: Late entry ? ? ?No notable events documented. ? ? ?Last Vitals:  ?Vitals:  ? 03/09/22 0846 03/09/22 0850  ?BP: (!) 83/41 118/74  ?Pulse: 72 84  ?Resp: (!) 26 17  ?Temp: 36.4 ?C   ?SpO2: 96% 98%  ?  ?Last Pain:  ?Vitals:  ? 03/09/22 0850  ?TempSrc:   ?PainSc: 0-No pain  ? ? ?  ?  ?  ?  ?  ?  ? ?Louann Sjogren ? ? ? ? ?

## 2022-03-09 NOTE — Anesthesia Preprocedure Evaluation (Signed)
Anesthesia Evaluation  ?Patient identified by MRN, date of birth, ID band ?Patient awake ? ? ? ?Reviewed: ?Allergy & Precautions, H&P , NPO status , Patient's Chart, lab work & pertinent test results, reviewed documented beta blocker date and time  ? ?Airway ?Mallampati: II ? ?TM Distance: >3 FB ?Neck ROM: full ? ? ? Dental ?no notable dental hx. ? ?  ?Pulmonary ?neg pulmonary ROS,  ?  ?Pulmonary exam normal ?breath sounds clear to auscultation ? ? ? ? ? ? Cardiovascular ?Exercise Tolerance: Good ?negative cardio ROS ? ? ?Rhythm:regular Rate:Normal ? ? ?  ?Neuro/Psych ? Headaches, PSYCHIATRIC DISORDERS Depression  Neuromuscular disease   ? GI/Hepatic ?Neg liver ROS, GERD  Medicated,  ?Endo/Other  ?diabetes, Type 2Morbid obesity ? Renal/GU ?negative Renal ROS  ?negative genitourinary ?  ?Musculoskeletal ? ? Abdominal ?  ?Peds ? Hematology ?negative hematology ROS ?(+)   ?Anesthesia Other Findings ? ? Reproductive/Obstetrics ?negative OB ROS ? ?  ? ? ? ? ? ? ? ? ? ? ? ? ? ?  ?  ? ? ? ? ? ? ? ? ?Anesthesia Physical ?Anesthesia Plan ? ?ASA: 3 ? ?Anesthesia Plan: General  ? ?Post-op Pain Management:   ? ?Induction:  ? ?PONV Risk Score and Plan: Propofol infusion ? ?Airway Management Planned:  ? ?Additional Equipment:  ? ?Intra-op Plan:  ? ?Post-operative Plan:  ? ?Informed Consent: I have reviewed the patients History and Physical, chart, labs and discussed the procedure including the risks, benefits and alternatives for the proposed anesthesia with the patient or authorized representative who has indicated his/her understanding and acceptance.  ? ? ? ?Dental Advisory Given ? ?Plan Discussed with: CRNA ? ?Anesthesia Plan Comments:   ? ? ? ? ? ? ?Anesthesia Quick Evaluation ? ?

## 2022-03-09 NOTE — Anesthesia Procedure Notes (Signed)
Date/Time: 03/09/2022 8:32 AM ?Performed by: Orlie Dakin, CRNA ?Pre-anesthesia Checklist: Patient identified, Emergency Drugs available, Suction available and Patient being monitored ?Patient Re-evaluated:Patient Re-evaluated prior to induction ?Oxygen Delivery Method: Nasal cannula ?Induction Type: IV induction ?Placement Confirmation: positive ETCO2 ? ? ? ? ?

## 2022-03-09 NOTE — Op Note (Signed)
[facility] ?Patient Name: Stacey Smith ?Procedure Date: 03/09/2022 8:05 AM ?MRN: [id number] ?Date of Birth: 01-09-1951 ?Attending MD: Norvel Richards , MD ?CSN: [id number] ?Age: 71 ?Admit Type: Outpatient ?Procedure:                Colonoscopy ?Indications:              High risk colon cancer surveillance: Personal  ?                          history of colonic polyps ?Providers:                Norvel Richards, MD, Caprice Kluver, Aram Candela ?Referring MD:              ?Medicines:                Propofol per Anesthesia ?Complications:            No immediate complications. ?Estimated Blood Loss:     Estimated blood loss was minimal. ?Procedure:                Pre-Anesthesia Assessment: ?                          - Prior to the procedure, a History and Physical  ?                          was performed, and patient medications and  ?                          allergies were reviewed. The patient's tolerance of  ?                          previous anesthesia was also reviewed. The risks  ?                          and benefits of the procedure and the sedation  ?                          options and risks were discussed with the patient.  ?                          All questions were answered, and informed consent  ?                          was obtained. Prior Anticoagulants: The patient has  ?                          taken no previous anticoagulant or antiplatelet  ?                          agents. ASA Grade Assessment: II - A patient with  ?                          mild systemic disease. After reviewing the risks  ?  and benefits, the patient was deemed in  ?                          satisfactory condition to undergo the procedure. ?                          After obtaining informed consent, the colonoscope  ?                          was passed under direct vision. Throughout the  ?                          procedure, the patient's blood pressure, pulse, and  ?                           oxygen saturations were monitored continuously. The  ?                          [id number]) scope was introduced through the  ?                          anus and advanced to the the cecum, identified by  ?                          appendiceal orifice and ileocecal valve. The  ?                          colonoscopy was performed without difficulty. The  ?                          patient tolerated the procedure well. The entire  ?                          colon was well visualized. ?Scope In: 8:29:53 AM ?Scope Out: 8:41:56 AM ?Scope Withdrawal Time: 0 hours 6 minutes 21 seconds  ?Total Procedure Duration: 0 hours 12 minutes 3 seconds  ?Findings: ?     The perianal and digital rectal examinations were normal. ?     Scattered small and large-mouthed diverticula were found in the entire  ?     colon. ?     Two pedunculated polyps were found in the descending colon and hepatic  ?     flexure. The polyps were 3 to 6 mm in size. These polyps were removed  ?     with a cold snare. Resection and retrieval were complete. Estimated  ?     blood loss was minimal. ?     The exam was otherwise without abnormality on direct and retroflexion  ?     views. ?Impression:               - Diverticulosis in the entire examined colon. ?                          - Two 3 to 6 mm polyps in the descending colon and  ?  at the hepatic flexure, removed with a cold snare.  ?                          Resected and retrieved. ?                          - The examination was otherwise normal on direct  ?                          and retroflexion views. ?Moderate Sedation: ?     Moderate (conscious) sedation was personally administered by an  ?     anesthesia professional. The following parameters were monitored: oxygen  ?     saturation, heart rate, blood pressure, respiratory rate, EKG, adequacy  ?     of pulmonary ventilation, and response to care. ?Recommendation:           - Patient has a contact number  available for  ?                          emergencies. The signs and symptoms of potential  ?                          delayed complications were discussed with the  ?                          patient. Return to normal activities tomorrow.  ?                          Written discharge instructions were provided to the  ?                          patient. ?                          - Resume previous diet. ?                          - Continue present medications. ?                          - Repeat colonoscopy date to be determined after  ?                          pending pathology results are reviewed for  ?                          surveillance. ?                          - Return to GI office (date not yet determined). ?Procedure Code(s):        --- Professional --- ?                          [id number], Colonoscopy, flexible; with removal of  ?  tumor(s), polyp(s), or other lesion(s) by snare  ?                          technique ?Diagnosis Code(s):        --- Professional --- ?                          Z86.010, Personal history of colonic polyps ?                          K63.5, Polyp of colon ?                          K57.30, Diverticulosis of large intestine without  ?                          perforation or abscess without bleeding ?CPT copyright 2019 [employer]. All rights reserved. ?The codes documented in this report are preliminary and upon coder review may  ?be revised to meet current compliance requirements. ?Cristopher Estimable. Tannie Koskela, MD ?Norvel Richards, MD ?03/09/2022 8:49:36 AM ?This report has been signed electronically. ?Number of Addenda: 0 ?

## 2022-03-09 NOTE — Discharge Instructions (Signed)
?  Colonoscopy ?Discharge Instructions ? ?Read the instructions outlined below and refer to this sheet in the next few weeks. These discharge instructions provide you with general information on caring for yourself after you leave the hospital. Your doctor may also give you specific instructions. While your treatment has been planned according to the most current medical practices available, unavoidable complications occasionally occur. If you have any problems or questions after discharge, call Dr. Gala Romney at [phone]. ?ACTIVITY ?You may resume your regular activity, but move at a slower pace for the next 24 hours.  ?Take frequent rest periods for the next 24 hours.  ?Walking will help get rid of the air and reduce the bloated feeling in your belly (abdomen).  ?No driving for 24 hours (because of the medicine (anesthesia) used during the test).   ?Do not sign any important legal documents or operate any machinery for 24 hours (because of the anesthesia used during the test).  ?NUTRITION ?Drink plenty of fluids.  ?You may resume your normal diet as instructed by your doctor.  ?Begin with a light meal and progress to your normal diet. Heavy or fried foods are harder to digest and may make you feel sick to your stomach (nauseated).  ?Avoid alcoholic beverages for 24 hours or as instructed.  ?MEDICATIONS ?You may resume your normal medications unless your doctor tells you otherwise.  ?WHAT YOU CAN EXPECT TODAY ?Some feelings of bloating in the abdomen.  ?Passage of more gas than usual.  ?Spotting of blood in your stool or on the toilet paper.  ?IF YOU HAD POLYPS REMOVED DURING THE COLONOSCOPY: ?No aspirin products for 7 days or as instructed.  ?No alcohol for 7 days or as instructed.  ?Eat a soft diet for the next 24 hours.  ?FINDING OUT THE RESULTS OF YOUR TEST ?Not all test results are available during your visit. If your test results are not back during the visit, make an appointment with your caregiver to find out the  results. Do not assume everything is normal if you have not heard from your caregiver or the medical facility. It is important for you to follow up on all of your test results.  ?SEEK IMMEDIATE MEDICAL ATTENTION IF: ?You have more than a spotting of blood in your stool.  ?Your belly is swollen (abdominal distention).  ?You are nauseated or vomiting.  ?You have a temperature over 101.  ?You have abdominal pain or discomfort that is severe or gets worse throughout the day.   ? ?2 polyps removed in your colon today ? ?Colon polyp and diverticulosis information provided ? ?Further recommendations to follow pending review of pathology report ? ?At patient request, I called Dion Saucier at [phone] -reviewed findings and recommendations ?

## 2022-03-10 ENCOUNTER — Encounter: Payer: Self-pay | Admitting: Internal Medicine

## 2022-03-10 LAB — SURGICAL PATHOLOGY

## 2022-03-14 ENCOUNTER — Encounter (HOSPITAL_COMMUNITY): Payer: Self-pay | Admitting: Internal Medicine

## 2022-03-24 DIAGNOSIS — E1169 Type 2 diabetes mellitus with other specified complication: Secondary | ICD-10-CM

## 2022-03-24 DIAGNOSIS — E785 Hyperlipidemia, unspecified: Secondary | ICD-10-CM | POA: Diagnosis not present

## 2022-03-25 ENCOUNTER — Other Ambulatory Visit: Payer: Self-pay | Admitting: Family Medicine

## 2022-03-30 ENCOUNTER — Other Ambulatory Visit: Payer: Self-pay | Admitting: Family Medicine

## 2022-04-03 ENCOUNTER — Other Ambulatory Visit: Payer: Self-pay | Admitting: Family Medicine

## 2022-04-05 ENCOUNTER — Telehealth: Payer: Self-pay

## 2022-04-05 ENCOUNTER — Other Ambulatory Visit: Payer: Self-pay

## 2022-04-05 MED ORDER — FLUTICASONE PROPIONATE 50 MCG/ACT NA SUSP: 2.0000 | Freq: Every day | NASAL | 1 refills | Status: DC | PRN

## 2022-04-05 NOTE — Telephone Encounter (Signed)
Patient called need refill. PATIENT ASKED IF IT CAN BE PRINTED OUT TO BE PICKED UP. CALL PATIENT WHEN READY FOR PICK UP [phone]. ? ?fluticasone (FLONASE) 50 MCG/ACT nasal spray ?

## 2022-04-05 NOTE — Telephone Encounter (Signed)
Patient aware up front for pick up 

## 2022-04-21 ENCOUNTER — Other Ambulatory Visit: Payer: Self-pay | Admitting: Family Medicine

## 2022-04-21 DIAGNOSIS — E785 Hyperlipidemia, unspecified: Secondary | ICD-10-CM | POA: Diagnosis not present

## 2022-04-21 DIAGNOSIS — E1169 Type 2 diabetes mellitus with other specified complication: Secondary | ICD-10-CM | POA: Diagnosis not present

## 2022-04-22 LAB — BMP8+EGFR
BUN/Creatinine Ratio: 14 (ref 12–28)
BUN: 12 mg/dL (ref 8–27)
CO2: 24 mmol/L (ref 20–29)
Calcium: 9.5 mg/dL (ref 8.7–10.3)
Chloride: 109 mmol/L — ABNORMAL HIGH (ref 96–106)
Creatinine, Ser: 0.84 mg/dL (ref 0.57–1.00)
Glucose: 92 mg/dL (ref 70–99)
Potassium: 4.3 mmol/L (ref 3.5–5.2)
Sodium: 144 mmol/L (ref 134–144)
eGFR: 75 mL/min/{1.73_m2} (ref >59)

## 2022-04-22 LAB — LIPID PANEL
Chol/HDL Ratio: 2.7 ratio (ref 0.0–4.4)
Cholesterol, Total: 132 mg/dL (ref 100–199)
HDL: 49 mg/dL (ref >39)
LDL Chol Calc (NIH): 56 mg/dL (ref 0–99)
Triglycerides: 162 mg/dL — ABNORMAL HIGH (ref 0–149)
VLDL Cholesterol Cal: 27 mg/dL (ref 5–40)

## 2022-04-22 LAB — HEMOGLOBIN A1C
Est. average glucose Bld gHb Est-mCnc: 157 mg/dL
Hgb A1c MFr Bld: 7.1 % — ABNORMAL HIGH (ref 4.8–5.6)

## 2022-04-26 ENCOUNTER — Encounter: Payer: Self-pay | Admitting: Family Medicine

## 2022-04-26 ENCOUNTER — Ambulatory Visit (INDEPENDENT_AMBULATORY_CARE_PROVIDER_SITE_OTHER): Payer: Medicare HMO | Admitting: Family Medicine

## 2022-04-26 VITALS — BP 131/78 | HR 100 | Ht 63.0 in | Wt 189.0 lb

## 2022-04-26 DIAGNOSIS — E66811 Obesity, class 1: Secondary | ICD-10-CM

## 2022-04-26 DIAGNOSIS — M79644 Pain in right finger(s): Secondary | ICD-10-CM | POA: Diagnosis not present

## 2022-04-26 DIAGNOSIS — E559 Vitamin D deficiency, unspecified: Secondary | ICD-10-CM

## 2022-04-26 DIAGNOSIS — M79641 Pain in right hand: Secondary | ICD-10-CM

## 2022-04-26 DIAGNOSIS — E785 Hyperlipidemia, unspecified: Secondary | ICD-10-CM | POA: Diagnosis not present

## 2022-04-26 DIAGNOSIS — E1169 Type 2 diabetes mellitus with other specified complication: Secondary | ICD-10-CM | POA: Diagnosis not present

## 2022-04-26 DIAGNOSIS — Z1231 Encounter for screening mammogram for malignant neoplasm of breast: Secondary | ICD-10-CM

## 2022-04-26 DIAGNOSIS — E669 Obesity, unspecified: Secondary | ICD-10-CM | POA: Diagnosis not present

## 2022-04-26 NOTE — Patient Instructions (Addendum)
F/U in early September, call if you need me sooner ? ?Fasting CBC, TSH, cmp and EGFR, HBA1C and vit D and microalb , 5 to 7 days before September visit ? ?CONGRATS on improved labs ? ?No med changes ? ?Need to eat regularly so blood sugar does not fall ? ?Please schedule mammogram at [facility]) ? ?You are referred to Orthopedics , dr Amedeo Plenty re right hand and index finger ? ?It is important that you exercise regularly at least 30 minutes 5 times a week. If you develop chest pain, have severe difficulty breathing, or feel very tired, stop exercising immediately and seek medical attention  ? ?Think about what you will eat, plan ahead. ?Choose " clean, green, fresh or frozen" over canned, processed or packaged foods which are more sugary, salty and fatty. ?70 to 75% of food eaten should be vegetables and fruit. ?Three meals at set times with snacks allowed between meals, but they must be fruit or vegetables. ?Aim to eat over a 12 hour period , example 7 am to 7 pm, and STOP after  your last meal of the day. ?Drink water,generally about 64 ounces per day, no other drink is as healthy. Fruit juice is best enjoyed in a healthy way, by EATING the fruit. ?Thanks for choosing [facility], we consider it a privelige to serve you. ? ? ? ? ?

## 2022-04-26 NOTE — Assessment & Plan Note (Addendum)
2 week  History of  Pain maybe triggered by  lifting dog, pops , up to a 6, new numbness across the hand, refer Ortho ?

## 2022-04-30 ENCOUNTER — Encounter: Payer: Self-pay | Admitting: Family Medicine

## 2022-04-30 NOTE — Assessment & Plan Note (Signed)
Hyperlipidemia:Low fat diet discussed and encouraged. ? ? ?Lipid Panel  ?Lab Results  ?Component Value Date  ? CHOL 132 04/21/2022  ? HDL 49 04/21/2022  ? [place] 56 04/21/2022  ? TRIG 162 (H) 04/21/2022  ? CHOLHDL 2.7 04/21/2022  ? ? ? ?Needs to reduce fat intake, improved overall, no med change ?

## 2022-04-30 NOTE — Progress Notes (Signed)
? ?WILHEMENIA CAMBA     MRN: [id number]      DOB: 05-Aug-1951 ? ? ?HPI ?Ms. Demario is here for follow up and re-evaluation of chronic medical conditions, medication management and review of any available recent lab and radiology data.  ?Preventive health is updated, specifically  Cancer screening and Immunization.   ?Questions or concerns regarding consultations or procedures which the PT has had in the interim are  addressed. ?The PT denies any adverse reactions to current medications since the last visit. ?Occasional sugar lows when she misses a meal ?2 week h/o right index pain, thinks that lifting a dog triggered the injury, finger is swollen and tender ?ROS ?Denies recent fever or chills. ?Denies sinus pressure, nasal congestion, ear pain or sore throat. ?Denies chest congestion, productive cough or wheezing. ?Denies chest pains, palpitations and leg swelling ?Denies abdominal pain, nausea, vomiting,diarrhea or constipation.   ?Denies dysuria, frequency, hesitancy or incontinence. ?. ?Denies headaches, seizures, numbness, or tingling. ?Denies depression, anxiety or insomnia. ?Denies skin break down or rash. ? ? ?PE ? ?BP 131/78   Pulse 100   Ht '5\' 3"'$  (1.6 m)   Wt 189 lb (85.7 kg)   SpO2 96%   BMI 33.48 kg/m?  ? ?Patient alert and oriented and in no cardiopulmonary distress. ? ?HEENT: No facial asymmetry, EOMI,     Neck supple . ? ?Chest: Clear to auscultation bilaterally. ? ?CVS: S1, S2 no murmurs, no S3.Regular rate. ? ?ABD: Soft non tender.  ? ?Ext: No edema ? ?MS: Adequate ROM spine, shoulders, hips and knees.swelling and tenderness over right index  ? ?Skin: Intact, no ulcerations or rash noted. ? ?Psych: Good eye contact, normal affect. Memory intact not anxious or depressed appearing. ? ?CNS: CN 2-12 intact, power,  normal throughout.no focal deficits noted. ? ? ?Assessment & Plan ? ?Finger pain, right ?2 week  History of  Pain maybe triggered by  lifting dog, pops , up to a 6, new numbness across the  hand, refer Ortho ? ?Type 2 diabetes mellitus with other specified complication ([place]) ?Ms. Jolly is reminded of the importance of commitment to daily physical activity for 30 minutes or more, as able and the need to limit carbohydrate intake to 30 to 60 grams per meal to help with blood sugar control.  ? ?The need to take medication as prescribed, test blood sugar as directed, and to call between visits if there is a concern that blood sugar is uncontrolled is also discussed.  ? ?Ms. Cuthbert is reminded of the importance of daily foot exam, annual eye examination, and good blood sugar, blood pressure and cholesterol control. ?Improved, nearly at goal, no med change, needs to start execise ? ? ?  Latest Ref Rng & Units 04/21/2022  ? 12:10 PM 01/26/2022  ? 11:28 AM 06/16/2021  ?  2:46 PM 06/13/2021  ?  9:34 AM 04/20/2021  ? 10:18 AM  ?Diabetic Labs  ?HbA1c 4.8 - 5.6 % 7.1   8.5    7.1     ?Micro/Creat Ratio 0 - 29 mg/g creat   9      ?Chol 100 - 199 mg/dL 132     146     ?HDL >39 mg/dL 49     50     ?Calc LDL 0 - 99 mg/dL 56     73     ?Triglycerides 0 - 149 mg/dL 162     132     ?Creatinine 0.57 - 1.00 mg/dL  0.84   0.88    0.83   0.87    ? ? ?  04/26/2022  ?  1:38 PM 03/09/2022  ?  8:50 AM 03/09/2022  ?  8:46 AM 03/09/2022  ?  7:54 AM 01/27/2022  ?  4:05 PM 01/16/2022  ?  8:58 AM 01/01/2022  ? 10:49 AM  ?BP/Weight  ?Systolic BP 295 621 83 308 122  132  ?Diastolic BP 78 74 41 80 82  86  ?Wt. (Lbs) 189   185 185.04 180   ?BMI 33.48 kg/m2   32.77 kg/m2 32.78 kg/m2 31.89 kg/m2   ? ? ?  Latest Ref Rng & Units 12/08/2020  ? 10:20 AM 10/15/2020  ? 12:00 AM  ?Foot/eye exam completion dates  ?Eye Exam No Retinopathy  No Retinopathy       ?Foot Form Completion  Done   ?  ? This result is from an external source.  ? ? ? ? ? ? ?Obesity (BMI 30.0-34.9) ? ?Patient re-educated about  the importance of commitment to a  minimum of 150 minutes of exercise per week as able. ? ?The importance of healthy food choices with portion control discussed, as  well as eating regularly and within a 12 hour window most days. ?The need to choose "clean , green" food 50 to 75% of the time is discussed, as well as to make water the primary drink and set a goal of 64 ounces water daily. ? ?  ? ?  04/26/2022  ?  1:38 PM 03/09/2022  ?  7:54 AM 01/27/2022  ?  4:05 PM  ?Weight /BMI  ?Weight 189 lb 185 lb 185 lb 0.6 oz  ?Height '5\' 3"'$  (1.6 m) '5\' 3"'$  (1.6 m) '5\' 3"'$  (1.6 m)  ?BMI 33.48 kg/m2 32.77 kg/m2 32.78 kg/m2  ? ? ? ? ?Hyperlipidemia associated with type 2 diabetes mellitus ([place]) ?Hyperlipidemia:Low fat diet discussed and encouraged. ? ? ?Lipid Panel  ?Lab Results  ?Component Value Date  ? CHOL 132 04/21/2022  ? HDL 49 04/21/2022  ? [place] 56 04/21/2022  ? TRIG 162 (H) 04/21/2022  ? CHOLHDL 2.7 04/21/2022  ? ? ? ?Needs to reduce fat intake, improved overall, no med change ? ?

## 2022-04-30 NOTE — Assessment & Plan Note (Signed)
?  Patient re-educated about  the importance of commitment to a  minimum of 150 minutes of exercise per week as able. ? ?The importance of healthy food choices with portion control discussed, as well as eating regularly and within a 12 hour window most days. ?The need to choose "clean , green" food 50 to 75% of the time is discussed, as well as to make water the primary drink and set a goal of 64 ounces water daily. ? ?  ? ?  04/26/2022  ?  1:38 PM 03/09/2022  ?  7:54 AM 01/27/2022  ?  4:05 PM  ?Weight /BMI  ?Weight 189 lb 185 lb 185 lb 0.6 oz  ?Height '5\' 3"'$  (1.6 m) '5\' 3"'$  (1.6 m) '5\' 3"'$  (1.6 m)  ?BMI 33.48 kg/m2 32.77 kg/m2 32.78 kg/m2  ? ? ? ?

## 2022-04-30 NOTE — Assessment & Plan Note (Signed)
Stacey Smith is reminded of the importance of commitment to daily physical activity for 30 minutes or more, as able and the need to limit carbohydrate intake to 30 to 60 grams per meal to help with blood sugar control.  ? ?The need to take medication as prescribed, test blood sugar as directed, and to call between visits if there is a concern that blood sugar is uncontrolled is also discussed.  ? ?Stacey Smith is reminded of the importance of daily foot exam, annual eye examination, and good blood sugar, blood pressure and cholesterol control. ?Improved, nearly at goal, no med change, needs to start execise ? ? ?  Latest Ref Rng & Units 04/21/2022  ? 12:10 PM 01/26/2022  ? 11:28 AM 06/16/2021  ?  2:46 PM 06/13/2021  ?  9:34 AM 04/20/2021  ? 10:18 AM  ?Diabetic Labs  ?HbA1c 4.8 - 5.6 % 7.1   8.5    7.1     ?Micro/Creat Ratio 0 - 29 mg/g creat   9      ?Chol 100 - 199 mg/dL 132     146     ?HDL >39 mg/dL 49     50     ?Calc LDL 0 - 99 mg/dL 56     73     ?Triglycerides 0 - 149 mg/dL 162     132     ?Creatinine 0.57 - 1.00 mg/dL 0.84   0.88    0.83   0.87    ? ? ?  04/26/2022  ?  1:38 PM 03/09/2022  ?  8:50 AM 03/09/2022  ?  8:46 AM 03/09/2022  ?  7:54 AM 01/27/2022  ?  4:05 PM 01/16/2022  ?  8:58 AM 01/01/2022  ? 10:49 AM  ?BP/Weight  ?Systolic BP 100 712 83 197 122  132  ?Diastolic BP 78 74 41 80 82  86  ?Wt. (Lbs) 189   185 185.04 180   ?BMI 33.48 kg/m2   32.77 kg/m2 32.78 kg/m2 31.89 kg/m2   ? ? ?  Latest Ref Rng & Units 12/08/2020  ? 10:20 AM 10/15/2020  ? 12:00 AM  ?Foot/eye exam completion dates  ?Eye Exam No Retinopathy  No Retinopathy       ?Foot Form Completion  Done   ?  ? This result is from an external source.  ? ? ? ? ? ?

## 2022-05-04 DIAGNOSIS — M65321 Trigger finger, right index finger: Secondary | ICD-10-CM | POA: Diagnosis not present

## 2022-05-04 DIAGNOSIS — M79641 Pain in right hand: Secondary | ICD-10-CM | POA: Diagnosis not present

## 2022-05-20 ENCOUNTER — Other Ambulatory Visit: Payer: Self-pay | Admitting: Family Medicine

## 2022-06-21 ENCOUNTER — Other Ambulatory Visit: Payer: Self-pay | Admitting: Family Medicine

## 2022-07-24 ENCOUNTER — Other Ambulatory Visit: Payer: Self-pay | Admitting: Family Medicine

## 2022-07-26 ENCOUNTER — Other Ambulatory Visit: Payer: Self-pay | Admitting: Family Medicine

## 2022-07-26 ENCOUNTER — Ambulatory Visit (HOSPITAL_COMMUNITY): Payer: Medicare HMO

## 2022-07-28 ENCOUNTER — Ambulatory Visit (HOSPITAL_COMMUNITY): Payer: Medicare HMO

## 2022-07-31 ENCOUNTER — Ambulatory Visit (HOSPITAL_COMMUNITY)
Admission: RE | Admit: 2022-07-31 | Discharge: 2022-07-31 | Disposition: A | Discharge status: Home / Self Care | Payer: Medicare HMO | Source: Ambulatory Visit | Attending: Family Medicine | Admitting: Family Medicine

## 2022-07-31 DIAGNOSIS — Z1231 Encounter for screening mammogram for malignant neoplasm of breast: Secondary | ICD-10-CM | POA: Insufficient documentation

## 2022-08-21 ENCOUNTER — Other Ambulatory Visit: Payer: Self-pay | Admitting: Family Medicine

## 2022-09-04 DIAGNOSIS — E559 Vitamin D deficiency, unspecified: Secondary | ICD-10-CM | POA: Diagnosis not present

## 2022-09-04 DIAGNOSIS — E1169 Type 2 diabetes mellitus with other specified complication: Secondary | ICD-10-CM | POA: Diagnosis not present

## 2022-09-04 DIAGNOSIS — E785 Hyperlipidemia, unspecified: Secondary | ICD-10-CM | POA: Diagnosis not present

## 2022-09-05 ENCOUNTER — Ambulatory Visit: Payer: Medicare HMO | Admitting: Family Medicine

## 2022-09-06 LAB — CBC
Hematocrit: 37.5 % (ref 34.0–46.6)
Hemoglobin: 11.8 g/dL (ref 11.1–15.9)
MCH: 23.7 pg — ABNORMAL LOW (ref 26.6–33.0)
MCHC: 31.5 g/dL (ref 31.5–35.7)
MCV: 76 fL — ABNORMAL LOW (ref 79–97)
Platelets: 302 10*3/uL (ref 150–450)
RBC: 4.97 x10E6/uL (ref 3.77–5.28)
RDW: 15.7 % — ABNORMAL HIGH (ref 11.7–15.4)
WBC: 8.5 10*3/uL (ref 3.4–10.8)

## 2022-09-06 LAB — CMP14+EGFR
ALT: 19 IU/L (ref 0–32)
AST: 18 IU/L (ref 0–40)
Albumin/Globulin Ratio: 1.5 (ref 1.2–2.2)
Albumin: 4.3 g/dL (ref 3.9–4.9)
Alkaline Phosphatase: 184 IU/L — ABNORMAL HIGH (ref 44–121)
BUN/Creatinine Ratio: 16 (ref 12–28)
BUN: 14 mg/dL (ref 8–27)
Bilirubin Total: 0.7 mg/dL (ref 0.0–1.2)
CO2: 21 mmol/L (ref 20–29)
Calcium: 9.7 mg/dL (ref 8.7–10.3)
Chloride: 105 mmol/L (ref 96–106)
Creatinine, Ser: 0.86 mg/dL (ref 0.57–1.00)
Globulin, Total: 2.8 g/dL (ref 1.5–4.5)
Glucose: 143 mg/dL — ABNORMAL HIGH (ref 70–99)
Potassium: 4.3 mmol/L (ref 3.5–5.2)
Sodium: 142 mmol/L (ref 134–144)
Total Protein: 7.1 g/dL (ref 6.0–8.5)
eGFR: 73 mL/min/{1.73_m2} (ref >59)

## 2022-09-06 LAB — MICROALBUMIN / CREATININE URINE RATIO
Creatinine, Urine: 88.7 mg/dL
Microalb/Creat Ratio: 5 mg/g creat (ref 0–29)
Microalbumin, Urine: 4.2 ug/mL

## 2022-09-06 LAB — HEMOGLOBIN A1C
Est. average glucose Bld gHb Est-mCnc: 148 mg/dL
Hgb A1c MFr Bld: 6.8 % — ABNORMAL HIGH (ref 4.8–5.6)

## 2022-09-06 LAB — TSH: TSH: 1.23 u[IU]/mL (ref 0.450–4.500)

## 2022-09-06 LAB — VITAMIN D 25 HYDROXY (VIT D DEFICIENCY, FRACTURES): Vit D, 25-Hydroxy: 32.6 ng/mL (ref 30.0–100.0)

## 2022-09-08 ENCOUNTER — Ambulatory Visit (INDEPENDENT_AMBULATORY_CARE_PROVIDER_SITE_OTHER): Payer: Medicare HMO | Admitting: Family Medicine

## 2022-09-08 ENCOUNTER — Encounter: Payer: Self-pay | Admitting: Family Medicine

## 2022-09-08 VITALS — BP 133/74 | HR 87 | Resp 16 | Ht 63.0 in | Wt 189.0 lb

## 2022-09-08 DIAGNOSIS — E785 Hyperlipidemia, unspecified: Secondary | ICD-10-CM

## 2022-09-08 DIAGNOSIS — E669 Obesity, unspecified: Secondary | ICD-10-CM | POA: Diagnosis not present

## 2022-09-08 DIAGNOSIS — E1169 Type 2 diabetes mellitus with other specified complication: Secondary | ICD-10-CM | POA: Diagnosis not present

## 2022-09-08 DIAGNOSIS — Z23 Encounter for immunization: Secondary | ICD-10-CM | POA: Diagnosis not present

## 2022-09-08 MED ORDER — IBUPROFEN 800 MG PO TABS: 800.0000 mg | ORAL_TABLET | Freq: Three times a day (TID) | ORAL | 1 refills | Status: DC | PRN

## 2022-09-08 NOTE — Assessment & Plan Note (Signed)
Hyperlipidemia:Low fat diet discussed and encouraged.   Lipid Panel  Lab Results  Component Value Date   CHOL 132 04/21/2022   HDL 49 04/21/2022   LDLCALC 56 04/21/2022   TRIG 162 (H) 04/21/2022   CHOLHDL 2.7 04/21/2022     Controlled, no change in medication

## 2022-09-08 NOTE — Patient Instructions (Signed)
Annual exam Feb 4 or after , call if you need me sooner  Flu vaccine today  Covid vaccine when available at pharmacy please  Excellent blood pressure and labs, no med changes, keep it up!  HBA1C, non fasting chem 7 and EGFR 5 to 7 days before next appt  It is important that you exercise regularly at least 30 minutes 5 times a week. If you develop chest pain, have severe difficulty breathing, or feel very tired, stop exercising immediately and seek medical attention   Thanks for choosing [facility], we consider it a privelige to serve you.

## 2022-09-08 NOTE — Progress Notes (Signed)
Stacey Smith     MRN: [id number]      DOB: [name] 15, 1951   HPI Stacey Smith is here for follow up and re-evaluation of chronic medical conditions, medication management and review of any available recent lab and radiology data.  Preventive health is updated, specifically  Cancer screening and Immunization.   Questions or concerns regarding consultations or procedures which the PT has had in the interim are  addressed. The PT denies any adverse reactions to current medications since the last visit.  There are no new concerns.  There are no specific complaints   ROS Denies recent fever or chills. Denies sinus pressure, nasal congestion, ear pain or sore throat. Denies chest congestion, productive cough or wheezing. Denies chest pains, palpitations and leg swelling Denies abdominal pain, nausea, vomiting,diarrhea or constipation.   Denies dysuria, frequency, hesitancy or incontinence. Denies joint pain, swelling and limitation in mobility. Denies headaches, seizures, numbness, or tingling. Denies depression, anxiety or insomnia. Denies skin break down or rash.   PE  BP 133/74   Pulse 87   Resp 16   Ht '5\' 3"'$  (1.6 m)   Wt 189 lb (85.7 kg)   SpO2 97%   BMI 33.48 kg/m   Patient alert and oriented and in no cardiopulmonary distress.  HEENT: No facial asymmetry, EOMI,     Neck supple .  Chest: Clear to auscultation bilaterally.  CVS: S1, S2 no murmurs, no S3.Regular rate.  ABD: Soft non tender.   Ext: No edema  MS: Adequate ROM spine, shoulders, hips and knees.  Skin: Intact, no ulcerations or rash noted.  Psych: Good eye contact, normal affect. Memory intact not anxious or depressed appearing.  CNS: CN 2-12 intact, power,  normal throughout.no focal deficits noted.   Assessment & Plan  Type 2 diabetes mellitus with other specified complication (HCC) Controlled, no change in medication Stacey Smith is reminded of the importance of commitment to daily physical activity  for 30 minutes or more, as able and the need to limit carbohydrate intake to 30 to 60 grams per meal to help with blood sugar control.   The need to take medication as prescribed, test blood sugar as directed, and to call between visits if there is a concern that blood sugar is uncontrolled is also discussed.   Stacey Smith is reminded of the importance of daily foot exam, annual eye examination, and good blood sugar, blood pressure and cholesterol control.     Latest Ref Rng & Units 09/04/2022   11:01 AM 04/21/2022   12:10 PM 01/26/2022   11:28 AM 06/16/2021    2:46 PM 06/13/2021    9:34 AM  Diabetic Labs  HbA1c 4.8 - 5.6 % 6.8  7.1  8.5   7.1   Micro/Creat Ratio 0 - 29 mg/g creat 5    9    Chol 100 - 199 mg/dL  132    146   HDL >39 mg/dL  49    50   Calc LDL 0 - 99 mg/dL  56    73   Triglycerides 0 - 149 mg/dL  162    132   Creatinine 0.57 - 1.00 mg/dL 0.86  0.84  0.88   0.83       09/08/2022   11:03 AM 04/26/2022    1:38 PM 03/09/2022    8:50 AM 03/09/2022    8:46 AM 03/09/2022    7:54 AM 01/27/2022    4:05 PM 01/16/2022    8:58  AM  BP/Weight  Systolic BP 267 124 580 83 998 338   Diastolic BP 74 78 74 41 80 82   Wt. (Lbs) 189 189   185 185.04 180  BMI 33.48 kg/m2 33.48 kg/m2   32.77 kg/m2 32.78 kg/m2 31.89 kg/m2      Latest Ref Rng & Units 12/08/2020   10:20 AM 10/15/2020   12:00 AM  Foot/eye exam completion dates  Eye Exam No Retinopathy  No Retinopathy      Foot Form Completion  Done      This result is from an external source.        Hyperlipidemia associated with type 2 diabetes mellitus ([place]) Hyperlipidemia:Low fat diet discussed and encouraged.   Lipid Panel  Lab Results  Component Value Date   CHOL 132 04/21/2022   HDL 49 04/21/2022   LDLCALC 56 04/21/2022   TRIG 162 (H) 04/21/2022   CHOLHDL 2.7 04/21/2022     Controlled, no change in medication   Obesity (BMI 30.0-34.9)  Patient re-educated about  the importance of commitment to a  minimum of 150  minutes of exercise per week as able.  The importance of healthy food choices with portion control discussed, as well as eating regularly and within a 12 hour window most days. The need to choose "clean , green" food 50 to 75% of the time is discussed, as well as to make water the primary drink and set a goal of 64 ounces water daily.       09/08/2022   11:03 AM 04/26/2022    1:38 PM 03/09/2022    7:54 AM  Weight /BMI  Weight 189 lb 189 lb 185 lb  Height '5\' 3"'$  (1.6 m) '5\' 3"'$  (1.6 m) '5\' 3"'$  (1.6 m)  BMI 33.48 kg/m2 33.48 kg/m2 32.77 kg/m2

## 2022-09-08 NOTE — Assessment & Plan Note (Signed)
Controlled, no change in medication Stacey Smith is reminded of the importance of commitment to daily physical activity for 30 minutes or more, as able and the need to limit carbohydrate intake to 30 to 60 grams per meal to help with blood sugar control.   The need to take medication as prescribed, test blood sugar as directed, and to call between visits if there is a concern that blood sugar is uncontrolled is also discussed.   Stacey Smith is reminded of the importance of daily foot exam, annual eye examination, and good blood sugar, blood pressure and cholesterol control.     Latest Ref Rng & Units 09/04/2022   11:01 AM 04/21/2022   12:10 PM 01/26/2022   11:28 AM 06/16/2021    2:46 PM 06/13/2021    9:34 AM  Diabetic Labs  HbA1c 4.8 - 5.6 % 6.8  7.1  8.5   7.1   Micro/Creat Ratio 0 - 29 mg/g creat 5    9    Chol 100 - 199 mg/dL  132    146   HDL >39 mg/dL  49    50   Calc LDL 0 - 99 mg/dL  56    73   Triglycerides 0 - 149 mg/dL  162    132   Creatinine 0.57 - 1.00 mg/dL 0.86  0.84  0.88   0.83       09/08/2022   11:03 AM 04/26/2022    1:38 PM 03/09/2022    8:50 AM 03/09/2022    8:46 AM 03/09/2022    7:54 AM 01/27/2022    4:05 PM 01/16/2022    8:58 AM  BP/Weight  Systolic BP 865 784 696 83 295 284   Diastolic BP 74 78 74 41 80 82   Wt. (Lbs) 189 189   185 185.04 180  BMI 33.48 kg/m2 33.48 kg/m2   32.77 kg/m2 32.78 kg/m2 31.89 kg/m2      Latest Ref Rng & Units 12/08/2020   10:20 AM 10/15/2020   12:00 AM  Foot/eye exam completion dates  Eye Exam No Retinopathy  No Retinopathy      Foot Form Completion  Done      This result is from an external source.

## 2022-09-08 NOTE — Assessment & Plan Note (Signed)
  Patient re-educated about  the importance of commitment to a  minimum of 150 minutes of exercise per week as able.  The importance of healthy food choices with portion control discussed, as well as eating regularly and within a 12 hour window most days. The need to choose "clean , green" food 50 to 75% of the time is discussed, as well as to make water the primary drink and set a goal of 64 ounces water daily.       09/08/2022   11:03 AM 04/26/2022    1:38 PM 03/09/2022    7:54 AM  Weight /BMI  Weight 189 lb 189 lb 185 lb  Height '5\' 3"'$  (1.6 m) '5\' 3"'$  (1.6 m) '5\' 3"'$  (1.6 m)  BMI 33.48 kg/m2 33.48 kg/m2 32.77 kg/m2

## 2022-09-19 ENCOUNTER — Other Ambulatory Visit: Payer: Self-pay | Admitting: Family Medicine

## 2022-09-20 ENCOUNTER — Encounter: Payer: Self-pay | Admitting: Nurse Practitioner

## 2022-09-20 ENCOUNTER — Ambulatory Visit (INDEPENDENT_AMBULATORY_CARE_PROVIDER_SITE_OTHER): Payer: Medicare HMO | Admitting: Nurse Practitioner

## 2022-09-20 DIAGNOSIS — Z0000 Encounter for general adult medical examination without abnormal findings: Secondary | ICD-10-CM

## 2022-09-20 NOTE — Progress Notes (Signed)
I connected with  [name] on 09/20/22 by a audio enabled telemedicine application and verified that I am speaking with the correct person using two identifiers.  Patient Location: Home  Provider Location: Home Office  I discussed the limitations of evaluation and management by telemedicine. The patient expressed understanding and agreed to proceed.  Subjective:   Stacey Smith is a 71 y.o. female who presents for Medicare Annual (Subsequent) preventive examination.  Review of Systems           Objective:    There were no vitals filed for this visit. There is no height or weight on file to calculate BMI.     03/09/2022    7:43 AM 08/25/2021    2:20 PM 08/19/2020   10:12 AM 06/04/2018   12:23 PM 11/01/2016    8:42 AM 03/24/2015   10:52 AM 09/23/2012    6:26 AM  Advanced Directives  Does Patient Have a Medical Advance Directive? Yes Yes Yes No Yes Yes Patient has advance directive, copy not in chart  Type of Advance Directive [place];Living will [place];Living will   Living will;Healthcare Power of Attorney    Does patient want to make changes to medical advance directive?   No - Patient declined      Copy of [place] in Chart? No - copy requested    No - copy requested    Would patient like information on creating a medical advance directive?    Yes (MAU/Ambulatory/Procedural Areas - Information given)       Current Medications (verified) Outpatient Encounter Medications as of 09/20/2022  Medication Sig   aspirin EC 81 MG tablet Take 1 tablet (81 mg total) by mouth daily. Swallow whole.   atorvastatin (LIPITOR) 80 MG tablet Take 1 tablet (80 mg total) by mouth daily.   Cholecalciferol (VITAMIN D3) 50 MCG (2000 UT) TABS Take 2,000 Units by mouth daily.   famotidine (PEPCID) 40 MG tablet TAKE 1 TABLET BY MOUTH EVERY DAY AS NEEDED FOR HEARTBURN   fluticasone (FLONASE) 50 MCG/ACT nasal spray Place 2 sprays into both  nostrils daily as needed. For allergies   glipiZIDE (GLUCOTROL XL) 5 MG 24 hr tablet TAKE 1 TABLET BY MOUTH EVERY DAY WITH BREAKFAST   ibuprofen (ADVIL) 800 MG tablet Take 1 tablet (800 mg total) by mouth every 8 (eight) hours as needed.   No facility-administered encounter medications on file as of 09/20/2022.    Allergies (verified) Metformin and related and Tessalon perles [benzonatate]   History: Past Medical History:  Diagnosis Date   Allergy    seasonal intermittently   Arthritis    spine   Back pain    Colon polyps    Diabetes mellitus without complication (HCC)    GERD (gastroesophageal reflux disease)    Headache(784.0)    Hyperlipidemia    well controlled   Obesity    S/P colonoscopy Oct 2007   Normal rectum, diminutive polyps in the left colon cold: TUBULAR ADENOMA   TMJ (dislocation of temporomandibular joint)    Past Surgical History:  Procedure Laterality Date   ABDOMINAL HYSTERECTOMY N/A    [employer] 03/04/2021   COLONOSCOPY  10/03/2011   Procedure: COLONOSCOPY;  Surgeon: Daneil Dolin, MD;  [facility];  Service: Endoscopy;  Laterality: N/A;  10:30   COLONOSCOPY N/A 11/01/2016   rourk: Diverticulosis, multiple colon polyps removed, tubular adenomas.  Next colonoscopy in November 2022.   COLONOSCOPY  WITH PROPOFOL N/A 03/09/2022   Procedure: COLONOSCOPY WITH PROPOFOL;  Surgeon: Daneil Dolin, MD;  [facility]: [facility];  Service: Endoscopy;  Laterality: N/A;  9:00 / ASA 2   CORONARY CT ANGIOGRAM  12/2018   Coronary calcium score of 0. This was 0 percentile for age & sex matched control. Normal coronary origin with right dominance. No evidence of CAD.    excision of cyst for thyroid gland non cancer  1988   POLYPECTOMY  11/01/2016   Procedure: POLYPECTOMY;  Surgeon: Daneil Dolin, MD;  [facility];  Service: Endoscopy;;  colon    POLYPECTOMY  03/09/2022   Procedure: POLYPECTOMY;  Surgeon: Daneil Dolin, MD;  [facility];   Service: Endoscopy;;   TMJ ARTHROPLASTY  09/23/2012   Procedure: TEMPOROMANDIBULAR JOINT (TMJ) ARTHROPLASTY;  Surgeon: Gae Bon, DDS;  Location: [facility];  Service: Oral Surgery;  Laterality: Left;  Temporomandibular joint arthrotomy and meniscectomy   [place]   right ovary fibroids    TUBAL LIGATION N/A    [employer]reesia 03/04/2021   Family History  Problem Relation Age of Onset   Stroke Mother    Hypertension Mother    Cancer Mother        breast    Glaucoma Mother    Asthma Mother    Coronary artery disease Father    Diabetes Father    Heart failure Father    Kidney disease Father    Diabetes Sister    Hypertension Sister    Diabetes Sister    Diabetes Sister    Depression Sister    Colon cancer Sister    Cancer - Colon Sister        colon    Thyroid disease Sister    Bipolar disorder Sister    Social History   Socioeconomic History   Marital status: Divorced    Spouse name: Not on file   Number of children: 2   Years of education: Not on file   Highest education level: Not on file  Occupational History   Occupation: Public affairs consultant: [employer]    Comment: Solicitor  Tobacco Use   Smoking status: Never   Smokeless tobacco: Never  Vaping Use   Vaping Use: Never used  Substance and Sexual Activity   Alcohol use: Yes    Comment: occasionally    Drug use: No   Sexual activity: Not Currently  Other Topics Concern   Not on file  Social History Narrative   Recently lost her long-term partner (2019)   Social Determinants of Health   Financial Resource Strain: Low Risk  (08/25/2021)   Overall Financial Resource Strain (CARDIA)    Difficulty of Paying Living Expenses: Not hard at all  Food Insecurity: No Food Insecurity (08/25/2021)   Hunger Vital Sign    Worried About Running Out of Food in the Last Year: Never true    Fries in the Last Year: Never true  Transportation Needs: No  Transportation Needs (08/25/2021)   PRAPARE - Hydrologist (Medical): No    Lack of Transportation (Non-Medical): No  Physical Activity: Inactive (08/25/2021)   Exercise Vital Sign    Days of Exercise per Week: 0 days    Minutes of Exercise per Session: 0 min  Stress: No Stress Concern Present (08/25/2021)   [employer] -  Occupational Stress Questionnaire    Feeling of Stress : Not at all  Social Connections: Moderately Integrated (08/25/2021)   Social Connection and Isolation Panel [NHANES]    Frequency of Communication with Friends and Family: More than three times a week    Frequency of Social Gatherings with Friends and Family: More than three times a week    Attends Religious Services: More than 4 times per year    Active Member of [employer] or Organizations: Yes    Attends Music therapist: More than 4 times per year    Marital Status: Divorced    Tobacco Counseling Counseling given: Not Answered   Clinical Intake:              How often do you need to have someone help you when you read instructions, pamphlets, or other written materials from your doctor or pharmacy?: (P) 1 - Never  Diabetic?Nutrition Risk Assessment:  Has the patient had any N/V/D within the last 2 months?  No  Does the patient have any non-healing wounds?  No  Has the patient had any unintentional weight loss or weight gain?  No   Diabetes:  Is the patient diabetic?  Yes  If diabetic, was a CBG obtained today?  No  Did the patient bring in their glucometer from home?  Yes  How often do you monitor your CBG's? Daily   Financial Strains and Diabetes Management:  Are you having any financial strains with the device, your supplies or your medication? No .  Does the patient want to be seen by Chronic Care Management for management of their diabetes?  No  Would the patient like to be referred to a Nutritionist or for Diabetic  Management?  No   Diabetic Exams:    Up to date with diabetic eye exam and foot exam           Activities of Daily Living    09/16/2022    9:06 AM  In your present state of health, do you have any difficulty performing the following activities:  Hearing? 0  Vision? 0  Difficulty concentrating or making decisions? 0  Walking or climbing stairs? 0  Dressing or bathing? 0  Doing errands, shopping? 0  Preparing Food and eating ? N  Using the Toilet? N  In the past six months, have you accidently leaked urine? Y  Do you have problems with loss of bowel control? N  Managing your Medications? N  Managing your Finances? N  Housekeeping or managing your Housekeeping? N    Patient Care Team: Fayrene Helper, MD as PCP - General Ellyn Hack Leonie Green, MD as PCP - Cardiology (Cardiology) Gala Romney Cristopher Estimable, MD (Gastroenterology) Beryle Lathe, [facility] (Inactive) (Pharmacist)  Indicate any recent Medical Services you may have received from other than [facility] providers in the past year (date may be approximate).     Assessment:   This is a routine wellness examination for Mikya.  Hearing/Vision screen No results found.  Dietary issues and exercise activities discussed:     Goals Addressed   None    Depression Screen    09/08/2022   11:03 AM 04/26/2022    1:39 PM 01/27/2022    4:06 PM 08/25/2021    2:19 PM 06/16/2021    2:04 PM 04/20/2021    9:43 AM 03/07/2021    8:07 AM  PHQ 2/9 Scores  PHQ - 2 Score 0 0 0 0 0 4 0  PHQ- 9 Score  13     Fall Risk    09/16/2022    9:06 AM 09/08/2022   11:03 AM 04/26/2022    1:39 PM 01/27/2022    4:06 PM 08/25/2021    2:21 PM  [place] in the past year?  0 0 0 0  Number falls in past yr: 0 0 0 0 0  Injury with Fall? 0 0 0 0 0  Risk for fall due to :   No Fall Risks No Fall Risks No Fall Risks  Follow up   Falls evaluation completed Falls evaluation completed Falls evaluation completed    [place]:  Any stairs in or around the home? Yes  If so, are there any without handrails? Not sure uses the elevator  Home free of loose throw rugs in walkways, pet beds, electrical cords, etc? Yes  Adequate lighting in your home to reduce risk of falls? Yes   ASSISTIVE DEVICES UTILIZED TO PREVENT FALLS:  Life alert? No  Use of a cane, walker or w/c? No  Grab bars in the bathroom? Yes  Shower chair or bench in shower? Yes  Elevated toilet seat or a handicapped toilet? No   TIMED UP AND GO:          08/25/2021    2:22 PM 08/19/2020   10:14 AM 07/08/2019    2:50 PM  6CIT Screen  What Year? 0 points 0 points 0 points  What month? 0 points 0 points 0 points  What time? 0 points 0 points 0 points  Count back from 20 0 points 0 points 0 points  Months in reverse 0 points 0 points 0 points  Repeat phrase 0 points 0 points 0 points  Total Score 0 points 0 points 0 points    Immunizations Immunization History  Administered Date(s) Administered   Fluad Quad(high Dose 65+) 09/08/2019, 10/04/2021, 09/08/2022   Influenza Split 11/23/2011, 10/23/2014   Influenza Whole 01/30/2008, 09/27/2010, 09/24/2012   Influenza, High Dose Seasonal PF 11/06/2018   Influenza,inj,Quad PF,6+ Mos 11/03/2014, 09/10/2015, 08/25/2016, 11/05/2017, 08/19/2020   Influenza,inj,quad, With Preservative 10/25/2017   PFIZER Comirnaty(Gray Top)Covid-19 Tri-Sucrose Vaccine 05/10/2021   PFIZER(Purple Top)SARS-COV-2 Vaccination 01/22/2020, 02/13/2020, 09/20/2020   Pfizer Covid-19 Vaccine Bivalent Booster 58yr & up 10/04/2021   Pneumococcal Conjugate PCV 7 10/25/2017   Pneumococcal Conjugate-13 03/04/2015, 11/01/2017   Pneumococcal Polysaccharide-23 11/25/2018   Pneumococcal-Unspecified 10/25/2017   Td 05/24/2004   Tdap 07/01/2014   Zoster Recombinat (Shingrix) 10/25/2021, 01/26/2022   Zoster, Live 01/16/2013    TDAP status: Up to date  Flu Vaccine status: Up to date  Pneumococcal vaccine status: Up to  date  Covid-19 vaccine status: Information provided on how to obtain vaccines.   Qualifies for Shingles Vaccine? Yes   Zostavax completed  Shingrix Completed?: Yes  Screening Tests Health Maintenance  Topic Date Due   COVID-19 Vaccine (6 - Pfizer series) 02/04/2022   OPHTHALMOLOGY EXAM  10/12/2022   FOOT EXAM  01/30/2023   HEMOGLOBIN A1C  03/05/2023   Diabetic kidney evaluation - GFR measurement  09/05/2023   Diabetic kidney evaluation - Urine ACR  09/05/2023   TETANUS/TDAP  07/01/2024   MAMMOGRAM  07/31/2024   COLONOSCOPY (Pts 45-433yrInsurance coverage will need to be confirmed)  03/10/2027   Pneumonia Vaccine 6573Years old  Completed   INFLUENZA VACCINE  Completed   DEXA SCAN  Completed   Hepatitis C Screening  Completed   Zoster Vaccines- Shingrix  Completed   HPV VACCINES  Aged Out    Health Maintenance  Health Maintenance Due  Topic Date Due   COVID-19 Vaccine (6 - Pfizer series) 02/04/2022    Assessment & Plan:     Up to date with mammogram and colonoscopy and dexa scan   Lung Cancer Screening: (Low Dose CT Chest recommended if Age 34-80 years, 30 pack-year currently smoking OR have quit w/in 15years.) does not qualify.   Lung Cancer Screening Referral: NA  Additional Screening:  Hepatitis C Screening: does not qualify; Completed   Vision Screening: Recommended annual ophthalmology exams for early detection of glaucoma and other disorders of the eye. Is the patient up to date with their annual eye exam?  Yes  Who is the provider or what is the name of the office in which the patient attends annual eye exams? My eye doctor  If pt is not established with a provider, would they like to be referred to a provider to establish care?   Dental Screening: Recommended annual dental exams for proper oral hygiene  Community Resource Referral / Chronic Care Management: CRR required this visit?  No   CCM required this visit?  No      Plan:     I have  personally reviewed and noted the following in the patient's chart:   Medical and social history Use of alcohol, tobacco or illicit drugs  Current medications and supplements including opioid prescriptions. Patient is not currently taking opioid prescriptions. Functional ability and status Nutritional status Physical activity Advanced directives List of other physicians Hospitalizations, surgeries, and ER visits in previous 12 months Vitals Screenings to include cognitive, depression, and falls Referrals and appointments  In addition, I have reviewed and discussed with patient certain preventive protocols, quality metrics, and best practice recommendations. A written personalized care plan for preventive services as well as general preventive health recommendations were provided to patient.     Renee Rival, FNP   09/20/2022   Nurse Notes:

## 2022-09-20 NOTE — Patient Instructions (Addendum)
  Stacey Smith , Thank you for taking time to come for your Medicare Wellness Visit. I appreciate your ongoing commitment to your health goals. Please review the following plan we discussed and let me know if I can assist you in the future.   These are the goals we discussed:  Goals      HEMOGLOBIN A1C < 7     Increase physical activity     Walk for 30 mins 5 days a week as able     Medication Management     Patient Goals/Self-Care Activities Patient will:  Focus on medication adherence by keeping up with prescription refills and either using a pill box or reminders to take your medications at the prescribed times Check blood sugar once a day at the following times: fasting (at least 8 hours since last food consumption) and whenever patient experiences symptoms of hypo/hyperglycemia, document, and provide at future appointments Target a minimum of 150 minutes of moderate intensity exercise weekly Engage in dietary modifications by less frequent dining out and fewer sweetened foods & beverages      Prevent falls        This is a list of the screening recommended for you and due dates:  Health Maintenance  Topic Date Due   COVID-19 Vaccine (6 - Pfizer series) 02/04/2022   Eye exam for diabetics  10/12/2022   Complete foot exam   01/30/2023   Hemoglobin A1C  03/05/2023   Yearly kidney function blood test for diabetes  09/05/2023   Yearly kidney health urinalysis for diabetes  09/05/2023   Tetanus Vaccine  07/01/2024   Mammogram  07/31/2024   Colon Cancer Screening  03/10/2027   Pneumonia Vaccine  Completed   Flu Shot  Completed   DEXA scan (bone density measurement)  Completed   Hepatitis C Screening: USPSTF Recommendation to screen - Ages 41-79 yo.  Completed   Zoster (Shingles) Vaccine  Completed   HPV Vaccine  Aged Out

## 2022-09-22 NOTE — Addendum Note (Signed)
Addended by: Eual Fines on: 09/22/2022 01:37 PM   Modules accepted: Orders, Level of Service

## 2022-09-29 ENCOUNTER — Other Ambulatory Visit: Payer: Self-pay

## 2022-09-29 ENCOUNTER — Encounter (HOSPITAL_BASED_OUTPATIENT_CLINIC_OR_DEPARTMENT_OTHER): Payer: Self-pay

## 2022-09-29 ENCOUNTER — Emergency Department (HOSPITAL_BASED_OUTPATIENT_CLINIC_OR_DEPARTMENT_OTHER)
Admission: EM | Admit: 2022-09-29 | Discharge: 2022-09-29 | Disposition: A | Discharge status: Home / Self Care | Payer: Medicare HMO | Attending: Emergency Medicine | Admitting: Emergency Medicine

## 2022-09-29 ENCOUNTER — Emergency Department (HOSPITAL_BASED_OUTPATIENT_CLINIC_OR_DEPARTMENT_OTHER): Payer: Medicare HMO

## 2022-09-29 DIAGNOSIS — Z7982 Long term (current) use of aspirin: Secondary | ICD-10-CM | POA: Insufficient documentation

## 2022-09-29 DIAGNOSIS — Y9302 Activity, running: Secondary | ICD-10-CM | POA: Insufficient documentation

## 2022-09-29 DIAGNOSIS — S0083XA Contusion of other part of head, initial encounter: Secondary | ICD-10-CM | POA: Diagnosis not present

## 2022-09-29 DIAGNOSIS — W01190A Fall on same level from slipping, tripping and stumbling with subsequent striking against furniture, initial encounter: Secondary | ICD-10-CM | POA: Insufficient documentation

## 2022-09-29 DIAGNOSIS — S0990XA Unspecified injury of head, initial encounter: Secondary | ICD-10-CM

## 2022-09-29 DIAGNOSIS — E119 Type 2 diabetes mellitus without complications: Secondary | ICD-10-CM | POA: Insufficient documentation

## 2022-09-29 DIAGNOSIS — Y92009 Unspecified place in unspecified non-institutional (private) residence as the place of occurrence of the external cause: Secondary | ICD-10-CM | POA: Insufficient documentation

## 2022-09-29 DIAGNOSIS — Z043 Encounter for examination and observation following other accident: Secondary | ICD-10-CM | POA: Diagnosis not present

## 2022-09-29 DIAGNOSIS — S0993XA Unspecified injury of face, initial encounter: Secondary | ICD-10-CM | POA: Diagnosis not present

## 2022-09-29 DIAGNOSIS — T148XXA Other injury of unspecified body region, initial encounter: Secondary | ICD-10-CM

## 2022-09-29 DIAGNOSIS — Z7984 Long term (current) use of oral hypoglycemic drugs: Secondary | ICD-10-CM | POA: Diagnosis not present

## 2022-09-29 DIAGNOSIS — M25571 Pain in right ankle and joints of right foot: Secondary | ICD-10-CM | POA: Diagnosis not present

## 2022-09-29 DIAGNOSIS — M25562 Pain in left knee: Secondary | ICD-10-CM | POA: Diagnosis not present

## 2022-09-29 DIAGNOSIS — M25561 Pain in right knee: Secondary | ICD-10-CM | POA: Diagnosis not present

## 2022-09-29 NOTE — ED Provider Notes (Signed)
[facility] EMERGENCY DEPT Provider Note   CSN: [id number] Arrival date & time: 09/29/22  1448     History  Chief Complaint  Patient presents with   Lytle Michaels    Stacey Smith is a 71 y.o. female.  Patient as above with significant medical history as below, including DM, GERD, HLD, obesity, TMJ who presents to the ED with complaint of fall.  Patient reports on Wednesday she tripped over a runner in her home, fell face forward and struck a table or wooden object in her living room.  No LOC, no thinners.  She was ambulatory after the event.  No nausea or vomiting following the fall.  Had pain to her right ankle after the fall, she was seen at [facility] earlier today and placed in a walking boot for presumed sprain.  She had a large hematoma to her forehead but noticed some bruising around her eyes over the past 24 hours and she was concerned that she came to the ED for evaluation.  Numbness or tingling, no vision change or hearing changes, no neck pain with range of motion, no difficulty moving her extremities, no further falls or injuries.  No significant headaches does have some pressure to her forehead where the hematoma is.     Past Medical History:  Diagnosis Date   Allergy    seasonal intermittently   Arthritis    spine   Back pain    Colon polyps    Diabetes mellitus without complication (HCC)    GERD (gastroesophageal reflux disease)    Headache(784.0)    Hyperlipidemia    well controlled   Obesity    S/P colonoscopy Oct 2007   Normal rectum, diminutive polyps in the left colon cold: TUBULAR ADENOMA   TMJ (dislocation of temporomandibular joint)     Past Surgical History:  Procedure Laterality Date   ABDOMINAL HYSTERECTOMY N/A    [employer]rees[employer] 03/04/2021   COLONOSCOPY  10/03/2011   Procedure: COLONOSCOPY;  Surgeon: Daneil Dolin, MD;  [facility];  Service: Endoscopy;  Laterality: N/A;  10:30   COLONOSCOPY N/A 11/01/2016   rourk:  Diverticulosis, multiple colon polyps removed, tubular adenomas.  Next colonoscopy in November 2022.   COLONOSCOPY WITH PROPOFOL N/A 03/09/2022   Procedure: COLONOSCOPY WITH PROPOFOL;  Surgeon: Daneil Dolin, MD;  Location: [facility];  Service: Endoscopy;  Laterality: N/A;  9:00 / ASA 2   CORONARY CT ANGIOGRAM  12/2018   Coronary calcium score of 0. This was 0 percentile for age & sex matched control. Normal coronary origin with right dominance. No evidence of CAD.    excision of cyst for thyroid gland non cancer  1988   POLYPECTOMY  11/01/2016   Procedure: POLYPECTOMY;  Surgeon: Daneil Dolin, MD;  Location: [facility];  Service: Endoscopy;;  colon    POLYPECTOMY  03/09/2022   Procedure: POLYPECTOMY;  Surgeon: Daneil Dolin, MD;  [facility];  Service: Endoscopy;;   TMJ ARTHROPLASTY  09/23/2012   Procedure: TEMPOROMANDIBULAR JOINT (TMJ) ARTHROPLASTY;  Surgeon: Gae Bon, DDS;  Location: [facility];  Service: Oral Surgery;  Laterality: Left;  Temporomandibular joint arthrotomy and meniscectomy   [facility]   right ovary fibroids    TUBAL LIGATION N/A    Phreesia 03/04/2021     The history is provided by the patient. No language interpreter was used.  Fall Pertinent negatives include no chest pain, no abdominal  pain, no headaches and no shortness of breath.       Home Medications Prior to Admission medications   Medication Sig Start Date End Date Taking? Authorizing Provider  aspirin EC 81 MG tablet Take 1 tablet (81 mg total) by mouth daily. Swallow whole. 07/26/20   Fayrene Helper, MD  atorvastatin (LIPITOR) 80 MG tablet Take 1 tablet (80 mg total) by mouth daily. 01/27/22   Fayrene Helper, MD  Cholecalciferol (VITAMIN D3) 50 MCG (2000 UT) TABS Take 2,000 Units by mouth daily.    [provider]  famotidine (PEPCID) 40 MG tablet TAKE 1 TABLET BY MOUTH EVERY DAY AS NEEDED FOR HEARTBURN 09/19/22   Fayrene Helper, MD  fluticasone [facility]) 50 MCG/ACT nasal spray Place 2 sprays into both nostrils daily as needed. For allergies 04/05/22   Fayrene Helper, MD  glipiZIDE (GLUCOTROL XL) 5 MG 24 hr tablet TAKE 1 TABLET BY MOUTH EVERY DAY WITH BREAKFAST 07/24/22   Fayrene Helper, MD  ibuprofen (ADVIL) 800 MG tablet Take 1 tablet (800 mg total) by mouth every 8 (eight) hours as needed. 09/08/22   Fayrene Helper, MD      Allergies    Metformin and related and Tessalon perles [benzonatate]    Review of Systems   Review of Systems  Constitutional:  Negative for activity change and fever.  HENT:  Negative for facial swelling and trouble swallowing.   Eyes:  Negative for discharge and redness.  Respiratory:  Negative for cough and shortness of breath.   Cardiovascular:  Negative for chest pain and palpitations.  Gastrointestinal:  Negative for abdominal pain and nausea.  Genitourinary:  Negative for dysuria and flank pain.  Musculoskeletal:  Positive for arthralgias. Negative for back pain and gait problem.  Skin:  Positive for color change. Negative for pallor and rash.  Neurological:  Negative for syncope and headaches.    Physical Exam Updated Vital Signs BP (!) 151/72 (BP Location: Right Arm)   Pulse 91   Temp 98.2 F (36.8 C) (Oral)   Resp 14   Ht '5\' 3"'$  (1.6 m)   Wt 85.7 kg   SpO2 98%   BMI 33.47 kg/m  Physical Exam Vitals and nursing note reviewed.  Constitutional:      General: She is awake. She is not in acute distress.    Appearance: Normal appearance.  HENT:     Head: Normocephalic. No Battle's sign, right periorbital erythema, left periorbital erythema or laceration.     Jaw: There is normal jaw occlusion.      Right Ear: External ear normal.     Left Ear: External ear normal.     Nose: Nose normal.     Right Nostril: No septal hematoma.     Left Nostril: No septal hematoma.     Mouth/Throat:     Mouth: Mucous membranes are moist.  Eyes:     General: No  scleral icterus.       Right eye: No discharge.        Left eye: No discharge.     Extraocular Movements: Extraocular movements intact.     Pupils: Pupils are equal, round, and reactive to light.  Neck:     Comments: No midline spinous process tenderness palpation / percussion to cervical spine. No crepitus or step off  Cardiovascular:     Rate and Rhythm: Normal rate and regular rhythm.     Pulses:  Radial pulses are 2+ on the right side and 2+ on the left side.     Heart sounds: Normal heart sounds.  Pulmonary:     Effort: Pulmonary effort is normal. No respiratory distress.     Breath sounds: Normal breath sounds.  Abdominal:     General: Abdomen is flat.     Tenderness: There is no abdominal tenderness.  Musculoskeletal:        General: Normal range of motion.     Cervical back: Full passive range of motion without pain and normal range of motion.     Right lower leg: No edema.     Left lower leg: No edema.  Skin:    General: Skin is warm and dry.     Capillary Refill: Capillary refill takes less than 2 seconds.          Comments: Cap refill is brisk to hands and feet  Neurological:     Mental Status: She is alert, oriented to person, place, and time and easily aroused.     GCS: GCS eye subscore is 4. GCS verbal subscore is 5. GCS motor subscore is 6.     Cranial Nerves: Cranial nerves 2-12 are intact. No dysarthria or facial asymmetry.     Sensory: Sensation is intact.     Motor: Motor function is intact. No weakness.     Coordination: Coordination is intact.     Gait: Gait is intact.     Comments: Ambulating with a cane, right ankle walking boot  Psychiatric:        Mood and Affect: Mood normal.        Behavior: Behavior normal. Behavior is cooperative.     ED Results / Procedures / Treatments   Labs (all labs ordered are listed, but only abnormal results are displayed) Labs Reviewed - No data to display  EKG None  Radiology CT Head Wo  Contrast  Result Date: 09/29/2022 CLINICAL DATA:  Provided history: Facial trauma, blunt. Additional history provided: Fall EXAM: CT HEAD WITHOUT CONTRAST CT MAXILLOFACIAL WITHOUT CONTRAST CT CERVICAL SPINE WITHOUT CONTRAST TECHNIQUE: Multidetector CT imaging of the head, cervical spine, and maxillofacial structures were performed using the standard protocol without intravenous contrast. Multiplanar CT image reconstructions of the cervical spine and maxillofacial structures were also generated. RADIATION DOSE REDUCTION: This exam was performed according to the departmental dose-optimization program which includes automated exposure control, adjustment of the mA and/or kV according to patient size and/or use of iterative reconstruction technique. COMPARISON:  Brain MRI 10/15/2013. Head CT 09/14/2004. Cervical spine MRI 12/29/2015. FINDINGS: CT HEAD FINDINGS Brain: There is no acute intracranial hemorrhage. No demarcated cortical infarct. No extra-axial fluid collection. No evidence of an intracranial mass. No midline shift. Vascular: No hyperdense vessel. Atherosclerotic calcifications. Skull: No fracture or aggressive osseous lesion. Other: Small-volume fluid within the right mastoid air cells. CT MAXILLOFACIAL FINDINGS Osseous: No acute maxillofacial fracture is identified. Orbits: No acute finding within the orbits. Sinuses: Mild mucosal thickening and small mucous retention cyst within the left sphenoid sinus. Trace consult thickening within the bilateral maxillary sinuses. Soft tissues: Subtle forehead soft tissue swelling is questioned (for instance as seen on series 2, image 72). Other: Left TMJ arthropathy. CT CERVICAL SPINE FINDINGS Alignment: Straightening of the expected cervical lordosis. Slight C3-C4 grade 1 retrolisthesis. Slight C4-C5 grade 1 retrolisthesis. Skull base and vertebrae: The basion-dental and atlanto-dental intervals are maintained.No evidence of acute fracture to the cervical spine.  Soft tissues and spinal canal: No prevertebral  fluid or swelling. No visible canal hematoma. Disc levels: Cervical spondylosis with multilevel disc space narrowing, disc bulges/central disc protrusions, endplate spurring, uncovertebral hypertrophy and facet arthrosis. No appreciable high-grade spinal canal stenosis. Multilevel bony neural foraminal narrowing Upper chest: No consolidation within the imaged lung apices. No visible pneumothorax. Other: Prior left hemithyroidectomy. IMPRESSION: CT head: 1. No evidence of acute intracranial abnormality. 2. Small-volume fluid within the right mastoid air cells. CT maxillofacial: 1. No evidence of acute maxillofacial fracture. 2. Suspected small forehead hematoma. 3. Mild paranasal sinus disease, as described. 4. Left TMJ arthropathy. CT cervical spine: 1. No evidence of acute fracture to the cervical spine. 2. Mild C3-C4 and C4-C5 grade 1 retrolisthesis. 3. Cervical spondylosis, as described. Electronically Signed   By: Kellie Simmering D.O.   On: 09/29/2022 16:06   CT Maxillofacial Wo Contrast  Result Date: 09/29/2022 CLINICAL DATA:  Provided history: Facial trauma, blunt. Additional history provided: Fall EXAM: CT HEAD WITHOUT CONTRAST CT MAXILLOFACIAL WITHOUT CONTRAST CT CERVICAL SPINE WITHOUT CONTRAST TECHNIQUE: Multidetector CT imaging of the head, cervical spine, and maxillofacial structures were performed using the standard protocol without intravenous contrast. Multiplanar CT image reconstructions of the cervical spine and maxillofacial structures were also generated. RADIATION DOSE REDUCTION: This exam was performed according to the departmental dose-optimization program which includes automated exposure control, adjustment of the mA and/or kV according to patient size and/or use of iterative reconstruction technique. COMPARISON:  Brain MRI 10/15/2013. Head CT 09/14/2004. Cervical spine MRI 12/29/2015. FINDINGS: CT HEAD FINDINGS Brain: There is no acute  intracranial hemorrhage. No demarcated cortical infarct. No extra-axial fluid collection. No evidence of an intracranial mass. No midline shift. Vascular: No hyperdense vessel. Atherosclerotic calcifications. Skull: No fracture or aggressive osseous lesion. Other: Small-volume fluid within the right mastoid air cells. CT MAXILLOFACIAL FINDINGS Osseous: No acute maxillofacial fracture is identified. Orbits: No acute finding within the orbits. Sinuses: Mild mucosal thickening and small mucous retention cyst within the left sphenoid sinus. Trace consult thickening within the bilateral maxillary sinuses. Soft tissues: Subtle forehead soft tissue swelling is questioned (for instance as seen on series 2, image 72). Other: Left TMJ arthropathy. CT CERVICAL SPINE FINDINGS Alignment: Straightening of the expected cervical lordosis. Slight C3-C4 grade 1 retrolisthesis. Slight C4-C5 grade 1 retrolisthesis. Skull base and vertebrae: The basion-dental and atlanto-dental intervals are maintained.No evidence of acute fracture to the cervical spine. Soft tissues and spinal canal: No prevertebral fluid or swelling. No visible canal hematoma. Disc levels: Cervical spondylosis with multilevel disc space narrowing, disc bulges/central disc protrusions, endplate spurring, uncovertebral hypertrophy and facet arthrosis. No appreciable high-grade spinal canal stenosis. Multilevel bony neural foraminal narrowing Upper chest: No consolidation within the imaged lung apices. No visible pneumothorax. Other: Prior left hemithyroidectomy. IMPRESSION: CT head: 1. No evidence of acute intracranial abnormality. 2. Small-volume fluid within the right mastoid air cells. CT maxillofacial: 1. No evidence of acute maxillofacial fracture. 2. Suspected small forehead hematoma. 3. Mild paranasal sinus disease, as described. 4. Left TMJ arthropathy. CT cervical spine: 1. No evidence of acute fracture to the cervical spine. 2. Mild C3-C4 and C4-C5 grade 1  retrolisthesis. 3. Cervical spondylosis, as described. Electronically Signed   By: Kellie Simmering D.O.   On: 09/29/2022 16:06   CT Cervical Spine Wo Contrast  Result Date: 09/29/2022 CLINICAL DATA:  Provided history: Facial trauma, blunt. Additional history provided: Fall EXAM: CT HEAD WITHOUT CONTRAST CT MAXILLOFACIAL WITHOUT CONTRAST CT CERVICAL SPINE WITHOUT CONTRAST TECHNIQUE: Multidetector CT imaging of the head, cervical spine, and  maxillofacial structures were performed using the standard protocol without intravenous contrast. Multiplanar CT image reconstructions of the cervical spine and maxillofacial structures were also generated. RADIATION DOSE REDUCTION: This exam was performed according to the departmental dose-optimization program which includes automated exposure control, adjustment of the mA and/or kV according to patient size and/or use of iterative reconstruction technique. COMPARISON:  Brain MRI 10/15/2013. Head CT 09/14/2004. Cervical spine MRI 12/29/2015. FINDINGS: CT HEAD FINDINGS Brain: There is no acute intracranial hemorrhage. No demarcated cortical infarct. No extra-axial fluid collection. No evidence of an intracranial mass. No midline shift. Vascular: No hyperdense vessel. Atherosclerotic calcifications. Skull: No fracture or aggressive osseous lesion. Other: Small-volume fluid within the right mastoid air cells. CT MAXILLOFACIAL FINDINGS Osseous: No acute maxillofacial fracture is identified. Orbits: No acute finding within the orbits. Sinuses: Mild mucosal thickening and small mucous retention cyst within the left sphenoid sinus. Trace consult thickening within the bilateral maxillary sinuses. Soft tissues: Subtle forehead soft tissue swelling is questioned (for instance as seen on series 2, image 72). Other: Left TMJ arthropathy. CT CERVICAL SPINE FINDINGS Alignment: Straightening of the expected cervical lordosis. Slight C3-C4 grade 1 retrolisthesis. Slight C4-C5 grade 1  retrolisthesis. Skull base and vertebrae: The basion-dental and atlanto-dental intervals are maintained.No evidence of acute fracture to the cervical spine. Soft tissues and spinal canal: No prevertebral fluid or swelling. No visible canal hematoma. Disc levels: Cervical spondylosis with multilevel disc space narrowing, disc bulges/central disc protrusions, endplate spurring, uncovertebral hypertrophy and facet arthrosis. No appreciable high-grade spinal canal stenosis. Multilevel bony neural foraminal narrowing Upper chest: No consolidation within the imaged lung apices. No visible pneumothorax. Other: Prior left hemithyroidectomy. IMPRESSION: CT head: 1. No evidence of acute intracranial abnormality. 2. Small-volume fluid within the right mastoid air cells. CT maxillofacial: 1. No evidence of acute maxillofacial fracture. 2. Suspected small forehead hematoma. 3. Mild paranasal sinus disease, as described. 4. Left TMJ arthropathy. CT cervical spine: 1. No evidence of acute fracture to the cervical spine. 2. Mild C3-C4 and C4-C5 grade 1 retrolisthesis. 3. Cervical spondylosis, as described. Electronically Signed   By: Kellie Simmering D.O.   On: 09/29/2022 16:06    Procedures Procedures    Medications Ordered in ED Medications - No data to display  ED Course/ Medical Decision Making/ A&P                           Medical Decision Making  This patient presents to the ED with chief complaint(s) of fall, head injury with pertinent past medical history of DM, TMJ, obesity which further complicates the presenting complaint. The complaint involves an extensive differential diagnosis and also carries with it a high risk of complications and morbidity.    Differential diagnoses for head trauma includes subdural hematoma, epidural hematoma, acute concussion, traumatic subarachnoid hemorrhage, cerebral contusions, etc.  . Serious etiologies were considered.   The initial plan is to imaging ordered in  triage   Additional history obtained: Additional history obtained from not applicable Records reviewed Primary Care Documents >> Seen at [facility] earlier today  Independent labs interpretation:  The following labs were independently interpreted: na  Independent visualization of imaging: - I independently visualized the following imaging with scope of interpretation limited to determining acute life threatening conditions related to emergency care: CT head, face, C-spine, which revealed forehead hematoma, TMJ, cervical spondylosis;no acute fracture or acute intracranial bleeding appreciated  Cardiac monitoring was reviewed and interpreted by myself which shows na  Treatment and Reassessment: Na  Symptoms stayed the same  Consultation: - Consulted or discussed management/test interpretation w/ external professional: na  Consideration for admission or further workup: Admission was considered   Well-appearing 71 year old female to the ED following apparent mechanical fall on Wednesday.  Patient presents with low mechanism head trauma. On initial evaluation patient appears in no acute distress, afebrile with normal vital signs. Patient has completely intact neurovascular exam  Head injury.  No thinners, no LOC.  Exam is reassuring, she does have hematoma to forehead with small amount of bruising to right infra orbit.  Neurologically she is intact.  Imaging reviewed and is stable.     Discussed possible etiology of concussion, signs and symptoms, and discharge instructions. Detailed discussions were had with the patient regarding current findings, and need for close f/u with PCP or on call doctor. The patient has been instructed to return immediately if the symptoms worsen in any way for re-evaluation. Patient verbalized understanding and is in agreement with current care plan. All questions answered prior to discharge   Social Determinants of health: Social History   Tobacco Use    Smoking status: Never   Smokeless tobacco: Never  Vaping Use   Vaping Use: Never used  Substance Use Topics   Alcohol use: Yes    Comment: occasionally    Drug use: No            Final Clinical Impression(s) / ED Diagnoses Final diagnoses:  Minor head injury, initial encounter  Hematoma    Rx / DC Orders ED Discharge Orders     None         Jeanell Sparrow, DO 09/29/22 1746

## 2022-09-29 NOTE — ED Triage Notes (Signed)
Patient here POV from Home.  Endorses having a Mechanical Fall on Wednesday after Slipping on a Mat. Possibly hit her Face/Head on a Table or Basket. No LOC. No Anticoagulants.   Seen at [facility] and sent for CT Imaging. Bruising to Forehead and Right Orbital Area.   NAD Noted during Triage. A&Ox4. GCS 15. BIB Wheelchair.

## 2022-09-29 NOTE — Discharge Instructions (Addendum)
It was a pleasure caring for you today in the emergency department.  Please return to the emergency department for any worsening or worrisome symptoms.    Based on the events which brought you to the ER today, it is possible that you may have a concussion. A concussion occurs when there is a blow to the head or body, with enough force to shake the brain and disrupt how the brain functions. You may experience symptoms such as headaches, sensitivity to light/noise, dizziness, cognitive slowing, difficulty concentrating / remembering, trouble sleeping and drowsiness. These symptoms may last anywhere from hours/days to potentially weeks/months. While these symptoms are very frustrating and perhaps debilitating, it is important that you remember that they will improve over time. Everyone has a different rate of recovery; it is difficult to predict when your symptoms will resolve. In order to allow for your brain to heal after the injury, we recommend that you see your primary physician or a physician knowledgeable in concussion management. We also advise you to let your body and brain rest: avoid physical activities (sports, gym, and exercise) and reduce cognitive demands (reading, texting, TV watching, computer use, video games, etc). School attendance, after-school activities and work may need to be modified to avoid increasing symptoms. We recommend against driving until until all symptoms have resolved. Come back to the ER right away if you are having repeated episodes of vomiting, severe/worsening headache/dizziness or any other symptom that alarms you. We recommended that someone stay with you for the next 24 hours to monitor for these worrisome symptoms.

## 2022-10-13 DIAGNOSIS — M25571 Pain in right ankle and joints of right foot: Secondary | ICD-10-CM | POA: Diagnosis not present

## 2022-10-13 DIAGNOSIS — M25561 Pain in right knee: Secondary | ICD-10-CM | POA: Diagnosis not present

## 2022-10-13 DIAGNOSIS — M25562 Pain in left knee: Secondary | ICD-10-CM | POA: Diagnosis not present

## 2022-10-18 ENCOUNTER — Other Ambulatory Visit: Payer: Self-pay | Admitting: Family Medicine

## 2022-11-03 DIAGNOSIS — M25571 Pain in right ankle and joints of right foot: Secondary | ICD-10-CM | POA: Diagnosis not present

## 2022-11-22 DIAGNOSIS — M25571 Pain in right ankle and joints of right foot: Secondary | ICD-10-CM | POA: Diagnosis not present

## 2022-11-22 DIAGNOSIS — S93491A Sprain of other ligament of right ankle, initial encounter: Secondary | ICD-10-CM | POA: Diagnosis not present

## 2022-12-09 DIAGNOSIS — M25571 Pain in right ankle and joints of right foot: Secondary | ICD-10-CM | POA: Diagnosis not present

## 2022-12-20 DIAGNOSIS — M25471 Effusion, right ankle: Secondary | ICD-10-CM | POA: Diagnosis not present

## 2022-12-20 DIAGNOSIS — S93491D Sprain of other ligament of right ankle, subsequent encounter: Secondary | ICD-10-CM | POA: Diagnosis not present

## 2023-01-24 DIAGNOSIS — E1169 Type 2 diabetes mellitus with other specified complication: Secondary | ICD-10-CM | POA: Diagnosis not present

## 2023-01-25 LAB — BMP8+EGFR
BUN/Creatinine Ratio: 18 (ref 12–28)
BUN: 16 mg/dL (ref 8–27)
CO2: 24 mmol/L (ref 20–29)
Calcium: 10.2 mg/dL (ref 8.7–10.3)
Chloride: 101 mmol/L (ref 96–106)
Creatinine, Ser: 0.88 mg/dL (ref 0.57–1.00)
Glucose: 148 mg/dL — ABNORMAL HIGH (ref 70–99)
Potassium: 4.4 mmol/L (ref 3.5–5.2)
Sodium: 141 mmol/L (ref 134–144)
eGFR: 71 mL/min/{1.73_m2} (ref >59)

## 2023-01-25 LAB — HEMOGLOBIN A1C
Est. average glucose Bld gHb Est-mCnc: 203 mg/dL
Hgb A1c MFr Bld: 8.7 % — ABNORMAL HIGH (ref 4.8–5.6)

## 2023-01-26 ENCOUNTER — Encounter: Payer: Medicare HMO | Admitting: Family Medicine

## 2023-02-01 ENCOUNTER — Encounter: Payer: Self-pay | Admitting: Family Medicine

## 2023-02-07 ENCOUNTER — Ambulatory Visit (INDEPENDENT_AMBULATORY_CARE_PROVIDER_SITE_OTHER): Payer: Medicare HMO | Admitting: Family Medicine

## 2023-02-07 ENCOUNTER — Encounter: Payer: Self-pay | Admitting: Family Medicine

## 2023-02-07 VITALS — BP 130/78 | HR 81 | Ht 63.0 in | Wt 191.0 lb

## 2023-02-07 DIAGNOSIS — Z0001 Encounter for general adult medical examination with abnormal findings: Secondary | ICD-10-CM

## 2023-02-07 DIAGNOSIS — E1169 Type 2 diabetes mellitus with other specified complication: Secondary | ICD-10-CM

## 2023-02-07 MED ORDER — SEMAGLUTIDE(0.25 OR 0.5MG/DOS) 2 MG/3ML ~~LOC~~ SOPN: 0.2500 mg | PEN_INJECTOR | SUBCUTANEOUS | 0 refills | Status: DC

## 2023-02-07 NOTE — Patient Instructions (Addendum)
F/U in 6 weeks with blood sugar log and send messages between visits  New is Semaglutide once weekly, let us know if you are able to get the medication as soon as possible  When you start semaglutide reduce glipizide to two 5 mg tablets daily, I am and 1 in  pm anticipating being able to stop the glipizide entirely.  It is important for you to test your blood sugar twice daily first thing  in the morning before you eat and last thing  at night since your blood sugar is now uncontrolled.  Goal for fasting blood sugar is 80-1 30 and bedtime sugar 1 30-1 80.  Nurse please refer patient for diabetic education diagnosis is uncontrolled diabetes.  It is very important that you do see the nutritionist so that you can make healthy food choices.  Foot exam is normal today.  Please let us know where you had your eye exam so we can get the information in your chart.  It is important that you exercise regularly at least 30 minutes 5 times a week. If you develop chest pain, have severe difficulty breathing, or feel very tired, stop exercising immediately and seek medical attention   Thanks for choosing [facility], we consider it a privelige to serve you.

## 2023-02-08 ENCOUNTER — Telehealth: Payer: Self-pay | Admitting: Family Medicine

## 2023-02-08 NOTE — Telephone Encounter (Signed)
Patient called in with [place]  She has picked up  Semaglutide,0.25 or 0.5MG/DOS, 2 MG/3ML [facility] VC:[id number]

## 2023-02-08 NOTE — Telephone Encounter (Signed)
Noted  

## 2023-02-12 ENCOUNTER — Encounter: Payer: Self-pay | Admitting: Family Medicine

## 2023-02-12 DIAGNOSIS — Z0001 Encounter for general adult medical examination with abnormal findings: Secondary | ICD-10-CM | POA: Insufficient documentation

## 2023-02-12 NOTE — Assessment & Plan Note (Signed)

## 2023-02-12 NOTE — Assessment & Plan Note (Signed)
Uncontrolled ,start Trulicity, refer diabetic ed, reveal in 6 weeks with log, pt to send in readings regularly between visits Stacey Smith is reminded of the importance of commitment to daily physical activity for 30 minutes or more, as able and the need to limit carbohydrate intake to 30 to 60 grams per meal to help with blood sugar control.   The need to take medication as prescribed, test blood sugar as directed, and to call between visits if there is a concern that blood sugar is uncontrolled is also discussed.   Stacey Smith is reminded of the importance of daily foot exam, annual eye examination, and good blood sugar, blood pressure and cholesterol control.     Latest Ref Rng & Units 01/24/2023   11:00 AM 09/04/2022   11:01 AM 04/21/2022   12:10 PM 01/26/2022   11:28 AM 06/16/2021    2:46 PM  Diabetic Labs  HbA1c 4.8 - 5.6 % 8.7  6.8  7.1  8.5    Micro/Creat Ratio 0 - 29 mg/g creat  5    9   Chol 100 - 199 mg/dL   132     HDL >39 mg/dL   49     Calc LDL 0 - 99 mg/dL   56     Triglycerides 0 - 149 mg/dL   162     Creatinine 0.57 - 1.00 mg/dL 0.88  0.86  0.84  0.88        02/07/2023    4:49 PM 02/07/2023    4:38 PM 02/07/2023    4:19 PM 09/29/2022    5:56 PM 09/29/2022    2:53 PM 09/29/2022    2:52 PM 09/08/2022   11:03 AM  BP/Weight  Systolic BP [id number]  [id number]  Diastolic BP 78 78 80 75 72  74  Wt. (Lbs)   191   188.93 189  BMI   33.83 kg/m2   33.47 kg/m2 33.48 kg/m2      Latest Ref Rng & Units 12/08/2020   10:20 AM 10/15/2020   12:00 AM  Foot/eye exam completion dates  Eye Exam No Retinopathy  No Retinopathy      Foot Form Completion  Done      This result is from an external source.

## 2023-02-12 NOTE — Progress Notes (Signed)
Stacey Smith     MRN: [id number]      DOB: 1951/03/24  HPI: Patient is in for annual physical exam. Uncontrolled diabetes is addressed with education and medication adjustment, as well as referral for diabetic ed Recent labs,  are reviewed. Immunization is reviewed , and  updated if needed.   PE: BP 130/78   Pulse 81   Ht 5' 3"$  (1.6 m)   Wt 191 lb (86.6 kg)   SpO2 93%   BMI 33.83 kg/m   Pleasant  female, alert and oriented x 3, in no cardio-pulmonary distress. Afebrile. HEENT No facial trauma or asymetry. Sinuses non tender.  Extra occullar muscles intact.. External ears normal, . Neck: supple, no adenopathy,JVD or thyromegaly.No bruits.  Chest: Clear to ascultation bilaterally.No crackles or wheezes. Non tender to palpation   Cardiovascular system; Heart sounds normal,  S1 and  S2 ,no S3.  No murmur, or thrill. Apical beat not displaced Peripheral pulses normal.  Abdomen: Soft, non tender, no organomegaly or masses. No bruits. Bowel sounds normal. No guarding, tenderness or rebound.    Musculoskeletal exam: Full ROM of spine, hips , shoulders and knees. No deformity ,swelling or crepitus noted. No muscle wasting or atrophy.   Neurologic: Cranial nerves 2 to 12 intact. Power, tone ,sensation and reflexes normal throughout. No disturbance in gait. No tremor.  Skin: Intact, no ulceration, erythema , scaling or rash noted. Pigmentation normal throughout  Psych; Normal mood and affect. Judgement and concentration normal   Assessment & Plan:  Encounter for Medicare annual examination with abnormal findings Annual exam as documented. Counseling done  re healthy lifestyle involving commitment to 150 minutes exercise per week, heart healthy diet, and attaining healthy weight.The importance of adequate sleep also discussed. Regular seat belt use and home safety, is also discussed. Changes in health habits are decided on by the patient with goals and  time frames  set for achieving them. Immunization and cancer screening needs are specifically addressed at this visit.   Type 2 diabetes mellitus with other specified complication (HCC) Uncontrolled ,start Trulicity, refer diabetic ed, reveal in 6 weeks with log, pt to send in readings regularly between visits [name] is reminded of the importance of commitment to daily physical activity for 30 minutes or more, as able and the need to limit carbohydrate intake to 30 to 60 grams per meal to help with blood sugar control.   The need to take medication as prescribed, test blood sugar as directed, and to call between visits if there is a concern that blood sugar is uncontrolled is also discussed.   [name] is reminded of the importance of daily foot exam, annual eye examination, and good blood sugar, blood pressure and cholesterol control.     Latest Ref Rng & Units 01/24/2023   11:00 AM 09/04/2022   11:01 AM 04/21/2022   12:10 PM 01/26/2022   11:28 AM 06/16/2021    2:46 PM  Diabetic Labs  HbA1c 4.8 - 5.6 % 8.7  6.8  7.1  8.5    Micro/Creat Ratio 0 - 29 mg/g creat  5    9   Chol 100 - 199 mg/dL   132     HDL >39 mg/dL   49     Calc LDL 0 - 99 mg/dL   56     Triglycerides 0 - 149 mg/dL   162     Creatinine 0.57 - 1.00 mg/dL 0.88  0.86  0.84  0.88        02/07/2023    4:49 PM 02/07/2023    4:38 PM 02/07/2023    4:19 PM 09/29/2022    5:56 PM 09/29/2022    2:53 PM 09/29/2022    2:52 PM 09/08/2022   11:03 AM  BP/Weight  Systolic BP [id number] [id number]  Q000111Q  Diastolic BP 78 78 80 75 72  74  Wt. (Lbs)   191   188.93 189  BMI   33.83 kg/m2   33.47 kg/m2 33.48 kg/m2      Latest Ref Rng & Units 12/08/2020   10:20 AM 10/15/2020   12:00 AM  Foot/eye exam completion dates  Eye Exam No Retinopathy  No Retinopathy      Foot Form Completion  Done      This result is from an external source.

## 2023-02-16 ENCOUNTER — Other Ambulatory Visit: Payer: Self-pay | Admitting: Family Medicine

## 2023-02-19 ENCOUNTER — Other Ambulatory Visit: Payer: Self-pay | Admitting: Family Medicine

## 2023-02-20 ENCOUNTER — Other Ambulatory Visit: Payer: Self-pay | Admitting: Family Medicine

## 2023-02-20 ENCOUNTER — Telehealth: Payer: Self-pay | Admitting: Family Medicine

## 2023-02-20 NOTE — Telephone Encounter (Signed)
Refills sent

## 2023-02-20 NOTE — Telephone Encounter (Signed)
Need refill, completely out One touch test strips sent to her pharmacy  Pharmacy  [id number]: [phone] Fax: [phone] #: [id number] DMarionReason: --

## 2023-03-01 ENCOUNTER — Other Ambulatory Visit: Payer: Self-pay | Admitting: Family Medicine

## 2023-03-28 ENCOUNTER — Ambulatory Visit: Payer: Medicare HMO | Admitting: Family Medicine

## 2023-04-04 ENCOUNTER — Other Ambulatory Visit: Payer: Self-pay | Admitting: Family Medicine

## 2023-04-10 ENCOUNTER — Ambulatory Visit (INDEPENDENT_AMBULATORY_CARE_PROVIDER_SITE_OTHER): Payer: Medicare HMO | Admitting: Family Medicine

## 2023-04-10 VITALS — BP 141/76 | HR 95 | Resp 16 | Ht 63.0 in | Wt 184.0 lb

## 2023-04-10 DIAGNOSIS — E669 Obesity, unspecified: Secondary | ICD-10-CM | POA: Diagnosis not present

## 2023-04-10 DIAGNOSIS — E785 Hyperlipidemia, unspecified: Secondary | ICD-10-CM | POA: Diagnosis not present

## 2023-04-10 DIAGNOSIS — Z723 Lack of physical exercise: Secondary | ICD-10-CM | POA: Diagnosis not present

## 2023-04-10 DIAGNOSIS — I10 Essential (primary) hypertension: Secondary | ICD-10-CM | POA: Diagnosis not present

## 2023-04-10 DIAGNOSIS — E1169 Type 2 diabetes mellitus with other specified complication: Secondary | ICD-10-CM

## 2023-04-10 MED ORDER — OLMESARTAN MEDOXOMIL 20 MG PO TABS: 20.0000 mg | ORAL_TABLET | Freq: Every day | ORAL | 5 refills | Status: DC

## 2023-04-10 NOTE — Patient Instructions (Signed)
F/U in office re evaluate blood pressure and review labs May 3 or the week of May 6, with PCP, call if you need me sooner  Fasting lipid, cmp and EGFr , hBA1C on May1, 2024  Increase Ozempic dose to 0.5 mg weekly Decrease glipizide 5 mg tab to ONE daiily  Aklcohol WILL elevate blood sugar!!  New for blood pressure is Olmesartan 20 mg one daily  It is important that you exercise regularly at least 30 minutes 5 times a week. If you develop chest pain, have severe difficulty breathing, or feel very tired, stop exercising immediately and seek medical attention   Continue to limit white starchy foods and sweets  Thanks for choosing [facility], we consider it a privelige to serve you.

## 2023-04-13 ENCOUNTER — Encounter: Payer: Self-pay | Admitting: Family Medicine

## 2023-04-13 DIAGNOSIS — I10 Essential (primary) hypertension: Secondary | ICD-10-CM | POA: Insufficient documentation

## 2023-04-13 NOTE — Assessment & Plan Note (Signed)
Recommitting top more regular exercise

## 2023-04-13 NOTE — Assessment & Plan Note (Signed)
Stacey Smith is reminded of the importance of commitment to daily physical activity for 30 minutes or more, as able and the need to limit carbohydrate intake to 30 to 60 grams per meal to help with blood sugar control.   The need to take medication as prescribed, test blood sugar as directed, and to call between visits if there is a concern that blood sugar is uncontrolled is also discussed.   Stacey Smith is reminded of the importance of daily foot exam, annual eye examination, and good blood sugar, blood pressure and cholesterol control. Increase ozempic and reduce glipizide Updated lab needed at/ before next visit.      Latest Ref Rng & Units 01/24/2023   11:00 AM 09/04/2022   11:01 AM 04/21/2022   12:10 PM 01/26/2022   11:28 AM 06/16/2021    2:46 PM  Diabetic Labs  HbA1c 4.8 - 5.6 % 8.7  6.8  7.1  8.5    Micro/Creat Ratio 0 - 29 mg/g creat  5    9   Chol 100 - 199 mg/dL   161     HDL >09 mg/dL   49     Calc LDL 0 - 99 mg/dL   56     Triglycerides 0 - 149 mg/dL   604     Creatinine 5.40 - 1.00 mg/dL 9.81  1.91  4.78  2.95        04/10/2023   10:43 AM 02/07/2023    4:49 PM 02/07/2023    4:38 PM 02/07/2023    4:19 PM 09/29/2022    5:56 PM 09/29/2022    2:53 PM 09/29/2022    2:52 PM  BP/Weight  Systolic BP 141 130 138 139 145 151   Diastolic BP 76 78 78 80 75 72   Wt. (Lbs) 184   191   188.93  BMI 32.59 kg/m2   33.83 kg/m2   33.47 kg/m2      Latest Ref Rng & Units 12/08/2020   10:20 AM 10/15/2020   12:00 AM  Foot/eye exam completion dates  Eye Exam No Retinopathy  No Retinopathy      Foot Form Completion  Done      This result is from an external source.

## 2023-04-13 NOTE — Assessment & Plan Note (Signed)
Hyperlipidemia:Low fat diet discussed and encouraged.   Lipid Panel  Lab Results  Component Value Date   CHOL 132 04/21/2022   HDL 49 04/21/2022   LDLCALC 56 04/21/2022   TRIG 162 (H) 04/21/2022   CHOLHDL 2.7 04/21/2022     Updated lab needed at/ before next visit. Needs to reduce fat intake

## 2023-04-13 NOTE — Assessment & Plan Note (Signed)
Improved  Patient re-educated about  the importance of commitment to a  minimum of 150 minutes of exercise per week as able.  The importance of healthy food choices with portion control discussed, as well as eating regularly and within a 12 hour window most days. The need to choose "clean , green" food 50 to 75% of the time is discussed, as well as to make water the primary drink and set a goal of 64 ounces water daily.       04/10/2023   10:43 AM 02/07/2023    4:19 PM 09/29/2022    2:52 PM  Weight /BMI  Weight 184 lb 191 lb 188 lb 15 oz  Height  (1.6 m)  (1.6 m)  (1.6 m)  BMI 32.59 kg/m2 33.83 kg/m2 33.47 kg/m2

## 2023-04-13 NOTE — Progress Notes (Signed)
Stacey Smith     MRN: [id number]      DOB: 1951/10/18   HPI Stacey Smith is here for follow up and re-evaluation of chronic medical conditions, medication management and review of any available recent lab and radiology data.  Preventive health is updated, specifically  Cancer screening and Immunization.   Questions or concerns regarding consultations or procedures which the PT has had in the interim are  addressed. The PT denies any adverse reactions to current medications since the last visit. Doing well with ozempic, no adverse s/e pleased with weight loss, at times has had low blood sugar levels Does state she drinks alcohol and this improves blood sugar!!I advised quite the opposite and encouraged her to LIMIT intake, denies excess use   ROS Denies recent fever or chills. Denies sinus pressure, nasal congestion, ear pain or sore throat. Denies chest congestion, productive cough or wheezing. Denies chest pains, palpitations and leg swelling Denies abdominal pain, nausea, vomiting,diarrhea or constipation.   Denies dysuria, frequency, hesitancy or incontinence. Denies joint pain, swelling and limitation in mobility. Denies headaches, seizures, numbness, or tingling. Denies depression, anxiety or insomnia. Denies skin break down or rash.   PE  BP (!) 141/76   Pulse 95   Resp 16   Ht  (1.6 m)   Wt 184 lb (83.5 kg)   SpO2 97%   BMI 32.59 kg/m   Patient alert and oriented and in no cardiopulmonary distress.  HEENT: No facial asymmetry, EOMI,     Neck supple .  Chest: Clear to auscultation bilaterally.  CVS: S1, S2 no murmurs, no S3.Regular rate.  ABD: Soft non tender.   Ext: No edema  MS: Adequate ROM spine, shoulders, hips and knees.  Skin: Intact, no ulcerations or rash noted.  Psych: Good eye contact, normal affect. Memory intact not anxious or depressed appearing.  CNS: CN 2-12 intact, power,  normal throughout.no focal deficits noted.   Assessment &  Plan  Essential hypertension Start olmesartan 20 mg daily DASH diet and commitment to daily physical activity for a minimum of 30 minutes discussed and encouraged, as a part of hypertension management. The importance of attaining a healthy weight is also discussed.     04/10/2023   10:43 AM 02/07/2023    4:49 PM 02/07/2023    4:38 PM 02/07/2023    4:19 PM 09/29/2022    5:56 PM 09/29/2022    2:53 PM 09/29/2022    2:52 PM  BP/Weight  Systolic BP 141 130 138 139 145 151   Diastolic BP 76 78 78 80 75 72   Wt. (Lbs) 184   191   188.93  BMI 32.59 kg/m2   33.83 kg/m2   33.47 kg/m2       Type 2 diabetes mellitus with other specified complication [facility]) Stacey Smith is reminded of the importance of commitment to daily physical activity for 30 minutes or more, as able and the need to limit carbohydrate intake to 30 to 60 grams per meal to help with blood sugar control.   The need to take medication as prescribed, test blood sugar as directed, and to call between visits if there is a concern that blood sugar is uncontrolled is also discussed.   Stacey Smith is reminded of the importance of daily foot exam, annual eye examination, and good blood sugar, blood pressure and cholesterol control. Increase ozempic and reduce glipizide Updated lab needed at/ before next visit.      Latest Ref  Rng & Units 01/24/2023   11:00 AM 09/04/2022   11:01 AM 04/21/2022   12:10 PM 01/26/2022   11:28 AM 06/16/2021    2:46 PM  Diabetic Labs  HbA1c 4.8 - 5.6 % 8.7  6.8  7.1  8.5    Micro/Creat Ratio 0 - 29 mg/g creat  5    9   Chol 100 - 199 mg/dL   470     HDL >96 mg/dL   49     Calc LDL 0 - 99 mg/dL   56     Triglycerides 0 - 149 mg/dL   283     Creatinine 6.62 - 1.00 mg/dL 9.47  6.54  6.50  3.54        04/10/2023   10:43 AM 02/07/2023    4:49 PM 02/07/2023    4:38 PM 02/07/2023    4:19 PM 09/29/2022    5:56 PM 09/29/2022    2:53 PM 09/29/2022    2:52 PM  BP/Weight  Systolic BP 141 130 138 139 145 151    Diastolic BP 76 78 78 80 75 72   Wt. (Lbs) 184   191   188.93  BMI 32.59 kg/m2   33.83 kg/m2   33.47 kg/m2      Latest Ref Rng & Units 12/08/2020   10:20 AM 10/15/2020   12:00 AM  Foot/eye exam completion dates  Eye Exam No Retinopathy  No Retinopathy      Foot Form Completion  Done      This result is from an external source.        Hyperlipidemia associated with type 2 diabetes mellitus (HCC) Hyperlipidemia:Low fat diet discussed and encouraged.   Lipid Panel  Lab Results  Component Value Date   CHOL 132 04/21/2022   HDL 49 04/21/2022   LDLCALC 56 04/21/2022   TRIG 162 (H) 04/21/2022   CHOLHDL 2.7 04/21/2022     Updated lab needed at/ before next visit. Needs to reduce fat intake  Obesity (BMI 30.0-34.9) Improved  Patient re-educated about  the importance of commitment to a  minimum of 150 minutes of exercise per week as able.  The importance of healthy food choices with portion control discussed, as well as eating regularly and within a 12 hour window most days. The need to choose "clean , green" food 50 to 75% of the time is discussed, as well as to make water the primary drink and set a goal of 64 ounces water daily.       04/10/2023   10:43 AM 02/07/2023    4:19 PM 09/29/2022    2:52 PM  Weight /BMI  Weight 184 lb 191 lb 188 lb 15 oz  Height  (1.6 m)  (1.6 m)  (1.6 m)  BMI 32.59 kg/m2 33.83 kg/m2 33.47 kg/m2      Lack of physical exercise Recommitting top more regular exercise

## 2023-04-13 NOTE — Assessment & Plan Note (Signed)
Start olmesartan 20 mg daily DASH diet and commitment to daily physical activity for a minimum of 30 minutes discussed and encouraged, as a part of hypertension management. The importance of attaining a healthy weight is also discussed.     04/10/2023   10:43 AM 02/07/2023    4:49 PM 02/07/2023    4:38 PM 02/07/2023    4:19 PM 09/29/2022    5:56 PM 09/29/2022    2:53 PM 09/29/2022    2:52 PM  BP/Weight  Systolic BP 141 130 138 139 145 151   Diastolic BP 76 78 78 80 75 72   Wt. (Lbs) 184   191   188.93  BMI 32.59 kg/m2   33.83 kg/m2   33.47 kg/m2

## 2023-04-17 DIAGNOSIS — L430 Hypertrophic lichen planus: Secondary | ICD-10-CM | POA: Diagnosis not present

## 2023-04-24 ENCOUNTER — Other Ambulatory Visit: Payer: Self-pay | Admitting: Family Medicine

## 2023-04-26 ENCOUNTER — Telehealth: Payer: Self-pay | Admitting: Family Medicine

## 2023-04-26 ENCOUNTER — Other Ambulatory Visit: Payer: Self-pay

## 2023-04-26 MED ORDER — GLIPIZIDE ER 5 MG PO TB24: ORAL_TABLET | ORAL | 1 refills | Status: DC

## 2023-04-26 NOTE — Telephone Encounter (Signed)
Refill sent.

## 2023-04-26 NOTE — Telephone Encounter (Signed)
MED will not refill before 5/27 pt has only 5 pills left . Is requesting new script be sent in to reflect recent change back to 1x daily    glipiZIDE (GLUCOTROL XL) 5 MG 24 hr tablet

## 2023-04-27 DIAGNOSIS — E785 Hyperlipidemia, unspecified: Secondary | ICD-10-CM | POA: Diagnosis not present

## 2023-04-27 DIAGNOSIS — E1169 Type 2 diabetes mellitus with other specified complication: Secondary | ICD-10-CM | POA: Diagnosis not present

## 2023-04-28 LAB — CMP14+EGFR
ALT: 18 IU/L (ref 0–32)
AST: 19 IU/L (ref 0–40)
Albumin/Globulin Ratio: 1.6 (ref 1.2–2.2)
Albumin: 4.1 g/dL (ref 3.9–4.9)
Alkaline Phosphatase: 167 IU/L — ABNORMAL HIGH (ref 44–121)
BUN/Creatinine Ratio: 17 (ref 12–28)
BUN: 15 mg/dL (ref 8–27)
Bilirubin Total: 0.8 mg/dL (ref 0.0–1.2)
CO2: 22 mmol/L (ref 20–29)
Calcium: 9.6 mg/dL (ref 8.7–10.3)
Chloride: 103 mmol/L (ref 96–106)
Creatinine, Ser: 0.86 mg/dL (ref 0.57–1.00)
Globulin, Total: 2.5 g/dL (ref 1.5–4.5)
Glucose: 93 mg/dL (ref 70–99)
Potassium: 4.1 mmol/L (ref 3.5–5.2)
Sodium: 141 mmol/L (ref 134–144)
Total Protein: 6.6 g/dL (ref 6.0–8.5)
eGFR: 73 mL/min/{1.73_m2} (ref >59)

## 2023-04-28 LAB — LIPID PANEL
Chol/HDL Ratio: 2.7 ratio (ref 0.0–4.4)
Cholesterol, Total: 114 mg/dL (ref 100–199)
HDL: 43 mg/dL (ref >39)
LDL Chol Calc (NIH): 48 mg/dL (ref 0–99)
Triglycerides: 128 mg/dL (ref 0–149)
VLDL Cholesterol Cal: 23 mg/dL (ref 5–40)

## 2023-04-28 LAB — HEMOGLOBIN A1C
Est. average glucose Bld gHb Est-mCnc: 151 mg/dL
Hgb A1c MFr Bld: 6.9 % — ABNORMAL HIGH (ref 4.8–5.6)

## 2023-05-02 ENCOUNTER — Ambulatory Visit (INDEPENDENT_AMBULATORY_CARE_PROVIDER_SITE_OTHER): Payer: Medicare HMO | Admitting: Family Medicine

## 2023-05-02 ENCOUNTER — Encounter: Payer: Self-pay | Admitting: Family Medicine

## 2023-05-02 VITALS — BP 110/70 | HR 85 | Ht 63.0 in | Wt 180.0 lb

## 2023-05-02 DIAGNOSIS — H9319 Tinnitus, unspecified ear: Secondary | ICD-10-CM | POA: Diagnosis not present

## 2023-05-02 DIAGNOSIS — E669 Obesity, unspecified: Secondary | ICD-10-CM

## 2023-05-02 DIAGNOSIS — H9202 Otalgia, left ear: Secondary | ICD-10-CM

## 2023-05-02 DIAGNOSIS — Z7984 Long term (current) use of oral hypoglycemic drugs: Secondary | ICD-10-CM

## 2023-05-02 DIAGNOSIS — E1169 Type 2 diabetes mellitus with other specified complication: Secondary | ICD-10-CM

## 2023-05-02 DIAGNOSIS — H6123 Impacted cerumen, bilateral: Secondary | ICD-10-CM

## 2023-05-02 DIAGNOSIS — I10 Essential (primary) hypertension: Secondary | ICD-10-CM

## 2023-05-02 DIAGNOSIS — E785 Hyperlipidemia, unspecified: Secondary | ICD-10-CM | POA: Diagnosis not present

## 2023-05-02 DIAGNOSIS — L439 Lichen planus, unspecified: Secondary | ICD-10-CM | POA: Diagnosis not present

## 2023-05-02 DIAGNOSIS — E1159 Type 2 diabetes mellitus with other circulatory complications: Secondary | ICD-10-CM | POA: Diagnosis not present

## 2023-05-02 DIAGNOSIS — Z1231 Encounter for screening mammogram for malignant neoplasm of breast: Secondary | ICD-10-CM

## 2023-05-02 DIAGNOSIS — H9190 Unspecified hearing loss, unspecified ear: Secondary | ICD-10-CM

## 2023-05-02 MED ORDER — SEMAGLUTIDE (1 MG/DOSE) 4 MG/3ML ~~LOC~~ SOPN: 1.0000 mg | PEN_INJECTOR | SUBCUTANEOUS | 3 refills | Status: DC

## 2023-05-02 MED ORDER — OLMESARTAN MEDOXOMIL 20 MG PO TABS: 20.0000 mg | ORAL_TABLET | Freq: Every day | ORAL | 3 refills | Status: DC

## 2023-05-02 MED ORDER — CIPROFLOXACIN-DEXAMETHASONE 0.3-0.1 % OT SUSP: 4.0000 [drp] | Freq: Two times a day (BID) | OTIC | 0 refills | Status: DC

## 2023-05-02 NOTE — Patient Instructions (Addendum)
F/U 2nd week in September, call if you need me before  Ear drop prescribed for left ear and you are referred to ENT.  It is important that you exercise regularly at least 30 minutes 5 times a week. If you develop chest pain, have severe difficulty breathing, or feel very tired, stop exercising immediately and seek medical attention    Congrats on excellent blood sugar.  When next due your Ozempic dose is increased to 1 mg, at that time I want you to stop glipizide.  Nurse please send in for twice daily testing.  Fasting lipid CMP and EGFR and HbA1c 3 to 5 days before next visit.  Please schedule mammogram at checkout.  Nurse pls send for eye exam done in [place] in [address]"   Thanks for choosing [facility], we consider it a privelige to serve you.

## 2023-05-03 ENCOUNTER — Other Ambulatory Visit: Payer: Self-pay

## 2023-05-03 ENCOUNTER — Telehealth: Payer: Self-pay | Admitting: Family Medicine

## 2023-05-03 MED ORDER — ONETOUCH VERIO VI STRP: ORAL_STRIP | 5 refills | Status: AC

## 2023-05-03 NOTE — Telephone Encounter (Signed)
Pt called in needs new script for [facility] VERIO test strip   Since testing frequency has change pt weill be out of strips before refill date

## 2023-05-03 NOTE — Telephone Encounter (Signed)
Script sent for twice daily.

## 2023-05-06 ENCOUNTER — Encounter: Payer: Self-pay | Admitting: Family Medicine

## 2023-05-06 DIAGNOSIS — H9202 Otalgia, left ear: Secondary | ICD-10-CM | POA: Insufficient documentation

## 2023-05-06 NOTE — Assessment & Plan Note (Signed)
Controlled, no change in medication DASH diet and commitment to daily physical activity for a minimum of 30 minutes discussed and encouraged, as a part of hypertension management. The importance of attaining a healthy weight is also discussed.     05/02/2023    3:16 PM 05/02/2023    2:40 PM 04/10/2023   10:43 AM 02/07/2023    4:49 PM 02/07/2023    4:38 PM 02/07/2023    4:19 PM 09/29/2022    5:56 PM  BP/Weight  Systolic BP 110 123 141 130 138 139 145  Diastolic BP 70 76 76 78 78 80 75  Wt. (Lbs)  180 184   191   BMI  31.89 kg/m2 32.59 kg/m2   33.83 kg/m2

## 2023-05-06 NOTE — Assessment & Plan Note (Signed)
Controlled, no change in medication Ms. Oshinski is reminded of the importance of commitment to daily physical activity for 30 minutes or more, as able and the need to limit carbohydrate intake to 30 to 60 grams per meal to help with blood sugar control.   The need to take medication as prescribed, test blood sugar as directed, and to call between visits if there is a concern that blood sugar is uncontrolled is also discussed.   Ms. Cohill is reminded of the importance of daily foot exam, annual eye examination, and good blood sugar, blood pressure and cholesterol control.     Latest Ref Rng & Units 04/27/2023    9:26 AM 01/24/2023   11:00 AM 09/04/2022   11:01 AM 04/21/2022   12:10 PM 01/26/2022   11:28 AM  Diabetic Labs  HbA1c 4.8 - 5.6 % 6.9  8.7  6.8  7.1  8.5   Micro/Creat Ratio 0 - 29 mg/g creat   5     Chol 100 - 199 mg/dL 161    096    HDL >04 mg/dL 43    49    Calc LDL 0 - 99 mg/dL 48    56    Triglycerides 0 - 149 mg/dL 540    981    Creatinine 0.57 - 1.00 mg/dL 1.91  4.78  2.95  6.21  0.88       05/02/2023    3:16 PM 05/02/2023    2:40 PM 04/10/2023   10:43 AM 02/07/2023    4:49 PM 02/07/2023    4:38 PM 02/07/2023    4:19 PM 09/29/2022    5:56 PM  BP/Weight  Systolic BP 110 123 141 130 138 139 145  Diastolic BP 70 76 76 78 78 80 75  Wt. (Lbs)  180 184   191   BMI  31.89 kg/m2 32.59 kg/m2   33.83 kg/m2       Latest Ref Rng & Units 12/08/2020   10:20 AM 10/15/2020   12:00 AM  Foot/eye exam completion dates  Eye Exam No Retinopathy  No Retinopathy      Foot Form Completion  Done      This result is from an external source.

## 2023-05-06 NOTE — Progress Notes (Signed)
Stacey Smith     MRN: [id number]      DOB: Oct 20, 1951  Chief Complaint  Patient presents with   Hypertension    Patient is here to f/u for HTN.    Ear Fullness    Patient states her L ear feels full, like there is fluid in it for a couple days.     HPI Stacey Smith is here for follow up and re-evaluation of chronic medical conditions, medication management and review of any available recent lab and radiology data.  Preventive health is updated, specifically  Cancer screening and Immunization.   Questions or concerns regarding consultations or procedures which the PT has had in the interim are  addressed. The PT denies any adverse reactions to current medications since the last visit.  Denies polyuria, polydipsia, blurred vision , or hypoglycemic episodes. Left ear fullness x 3 to 5 days, no loss of hearing, c/o chronic tinnitus  and hearing loss ROS Denies recent fever or chills. Denies sinus pressure, nasal congestion,  or sore throat. Denies chest congestion, productive cough or wheezing. Denies chest pains, palpitations and leg swelling Denies abdominal pain, nausea, vomiting,diarrhea or constipation.   Denies dysuria, frequency, hesitancy or incontinence. Denies joint pain, swelling and limitation in mobility. Denies headaches, seizures, numbness, or tingling. Denies depression, anxiety or insomnia. Denies skin break down or rash.   PE  BP 110/70   Pulse 85   Ht 5\' 3"  (1.6 m)   Wt 180 lb (81.6 kg)   SpO2 93%   BMI 31.89 kg/m   Patient alert and oriented and in no cardiopulmonary distress.  HEENT: No facial asymmetry, EOMI,     Neck supple .Right TM clear with good light reflex and no cerumen, left External canal debris in cerumen also reduced light reflex and TM  mildly erythematous  Chest: Clear to auscultation bilaterally.  CVS: S1, S2 no murmurs, no S3.Regular rate.  ABD: Soft non tender.   Ext: No edema  MS: Adequate ROM spine, shoulders, hips and  knees.  Skin: Intact, no ulcerations or rash noted.  Psych: Good eye contact, normal affect. Memory intact not anxious or depressed appearing.  CNS: CN 2-12 intact, power,  normal throughout.no focal deficits noted.   Assessment & Plan  Otalgia, left Cirpodex prescribed, chronic tinnitus, hearing loss and pain, refer ENT  Type 2 diabetes mellitus with other specified complication (HCC) Controlled, no change in medication Stacey Smith is reminded of the importance of commitment to daily physical activity for 30 minutes or more, as able and the need to limit carbohydrate intake to 30 to 60 grams per meal to help with blood sugar control.   The need to take medication as prescribed, test blood sugar as directed, and to call between visits if there is a concern that blood sugar is uncontrolled is also discussed.   Stacey Smith is reminded of the importance of daily foot exam, annual eye examination, and good blood sugar, blood pressure and cholesterol control.     Latest Ref Rng & Units 04/27/2023    9:26 AM 01/24/2023   11:00 AM 09/04/2022   11:01 AM 04/21/2022   12:10 PM 01/26/2022   11:28 AM  Diabetic Labs  HbA1c 4.8 - 5.6 % 6.9  8.7  6.8  7.1  8.5   Micro/Creat Ratio 0 - 29 mg/g creat   5     Chol 100 - 199 mg/dL 409    811    HDL >91 mg/dL 43  49    Calc LDL 0 - 99 mg/dL 48    56    Triglycerides 0 - 149 mg/dL 161    096    Creatinine 0.57 - 1.00 mg/dL 0.45  4.09  8.11  9.14  0.88       05/02/2023    3:16 PM 05/02/2023    2:40 PM 04/10/2023   10:43 AM 02/07/2023    4:49 PM 02/07/2023    4:38 PM 02/07/2023    4:19 PM 09/29/2022    5:56 PM  BP/Weight  Systolic BP 110 123 141 130 138 139 145  Diastolic BP 70 76 76 78 78 80 75  Wt. (Lbs)  180 184   191   BMI  31.89 kg/m2 32.59 kg/m2   33.83 kg/m2       Latest Ref Rng & Units 12/08/2020   10:20 AM 10/15/2020   12:00 AM  Foot/eye exam completion dates  Eye Exam No Retinopathy  No Retinopathy      Foot Form Completion  Done       This result is from an external source.        Hyperlipidemia associated with type 2 diabetes mellitus (HCC) Hyperlipidemia:Low fat diet discussed and encouraged.   Lipid Panel  Lab Results  Component Value Date   CHOL 114 04/27/2023   HDL 43 04/27/2023   LDLCALC 48 04/27/2023   TRIG 128 04/27/2023   CHOLHDL 2.7 04/27/2023     Controlled, no change in medication   Essential hypertension Controlled, no change in medication DASH diet and commitment to daily physical activity for a minimum of 30 minutes discussed and encouraged, as a part of hypertension management. The importance of attaining a healthy weight is also discussed.     05/02/2023    3:16 PM 05/02/2023    2:40 PM 04/10/2023   10:43 AM 02/07/2023    4:49 PM 02/07/2023    4:38 PM 02/07/2023    4:19 PM 09/29/2022    5:56 PM  BP/Weight  Systolic BP 110 123 141 130 138 139 145  Diastolic BP 70 76 76 78 78 80 75  Wt. (Lbs)  180 184   191   BMI  31.89 kg/m2 32.59 kg/m2   33.83 kg/m2        Obesity (BMI 30.0-34.9) Improving , with gradual weight loss, Stacey Smith is applauded on this and encouraged to continue same  Patient re-educated about  the importance of commitment to a  minimum of 150 minutes of exercise per week as able.  The importance of healthy food choices with portion control discussed, as well as eating regularly and within a 12 hour window most days. The need to choose "clean , green" food 50 to 75% of the time is discussed, as well as to make water the primary drink and set a goal of 64 ounces water daily.       05/02/2023    2:40 PM 04/10/2023   10:43 AM 02/07/2023    4:19 PM  Weight /BMI  Weight 180 lb 184 lb 191 lb  Height 5\' 3"  (1.6 m) 5\' 3"  (1.6 m) 5\' 3"  (1.6 m)  BMI 31.89 kg/m2 32.59 kg/m2 33.83 kg/m2

## 2023-05-06 NOTE — Assessment & Plan Note (Signed)
Cirpodex prescribed, chronic tinnitus, hearing loss and pain, refer ENT

## 2023-05-06 NOTE — Assessment & Plan Note (Signed)
Hyperlipidemia:Low fat diet discussed and encouraged.   Lipid Panel  Lab Results  Component Value Date   CHOL 114 04/27/2023   HDL 43 04/27/2023   LDLCALC 48 04/27/2023   TRIG 128 04/27/2023   CHOLHDL 2.7 04/27/2023     Controlled, no change in medication

## 2023-05-06 NOTE — Assessment & Plan Note (Signed)
Improving , with gradual weight loss, she is applauded on this and encouraged to continue same  Patient re-educated about  the importance of commitment to a  minimum of 150 minutes of exercise per week as able.  The importance of healthy food choices with portion control discussed, as well as eating regularly and within a 12 hour window most days. The need to choose "clean , green" food 50 to 75% of the time is discussed, as well as to make water the primary drink and set a goal of 64 ounces water daily.       05/02/2023    2:40 PM 04/10/2023   10:43 AM 02/07/2023    4:19 PM  Weight /BMI  Weight 180 lb 184 lb 191 lb  Height 5\' 3"  (1.6 m) 5\' 3"  (1.6 m) 5\' 3"  (1.6 m)  BMI 31.89 kg/m2 32.59 kg/m2 33.83 kg/m2

## 2023-05-30 ENCOUNTER — Telehealth: Payer: Self-pay | Admitting: Family Medicine

## 2023-05-30 NOTE — Telephone Encounter (Signed)
Patient assistance forms   Noted  Copied Sleeved  Original in PCP box Copy front desk folder

## 2023-05-31 DIAGNOSIS — L438 Other lichen planus: Secondary | ICD-10-CM | POA: Diagnosis not present

## 2023-06-07 ENCOUNTER — Telehealth: Payer: Self-pay

## 2023-06-07 NOTE — Telephone Encounter (Signed)
Patient came into the office to pick up ozempic which we do not have we have sent off the documents for patient assistance and are waiting to here back from them patient aware please advise which medication she should take in the mean time.

## 2023-06-12 NOTE — Telephone Encounter (Signed)
Patient aware.

## 2023-06-18 ENCOUNTER — Telehealth: Payer: Self-pay | Admitting: Family Medicine

## 2023-06-18 NOTE — Telephone Encounter (Signed)
Patient called to follow up on recent script for [facility]; forms completed for patient assistance through [employer]. Patient stated quantity not specified (default amount on form is 120 days). Patient stated she's almost out of pens.  Please advise at [phone].

## 2023-06-21 NOTE — Telephone Encounter (Signed)
Received fax from [employer] today requesting a copy of patient's insurance card faxed to them

## 2023-07-11 ENCOUNTER — Other Ambulatory Visit: Payer: Self-pay

## 2023-07-11 MED ORDER — IBUPROFEN 800 MG PO TABS: 800.0000 mg | ORAL_TABLET | Freq: Three times a day (TID) | ORAL | 1 refills | Status: DC | PRN

## 2023-07-30 ENCOUNTER — Other Ambulatory Visit (HOSPITAL_COMMUNITY): Payer: Self-pay | Admitting: Family Medicine

## 2023-07-30 DIAGNOSIS — Z1231 Encounter for screening mammogram for malignant neoplasm of breast: Secondary | ICD-10-CM

## 2023-08-01 ENCOUNTER — Encounter (HOSPITAL_COMMUNITY): Payer: Self-pay

## 2023-08-01 ENCOUNTER — Ambulatory Visit (HOSPITAL_COMMUNITY)
Admission: RE | Admit: 2023-08-01 | Discharge: 2023-08-01 | Disposition: A | Discharge status: Home / Self Care | Payer: Medicare HMO | Source: Ambulatory Visit | Attending: Family Medicine | Admitting: Family Medicine

## 2023-08-01 ENCOUNTER — Inpatient Hospital Stay (HOSPITAL_COMMUNITY): Admission: RE | Admit: 2023-08-01 | Payer: Medicare HMO | Source: Ambulatory Visit

## 2023-08-01 DIAGNOSIS — Z1231 Encounter for screening mammogram for malignant neoplasm of breast: Secondary | ICD-10-CM

## 2023-09-04 ENCOUNTER — Ambulatory Visit (INDEPENDENT_AMBULATORY_CARE_PROVIDER_SITE_OTHER): Payer: Medicare HMO | Admitting: Family Medicine

## 2023-09-04 ENCOUNTER — Ambulatory Visit: Payer: Medicare HMO | Admitting: Family Medicine

## 2023-09-04 ENCOUNTER — Encounter: Payer: Self-pay | Admitting: Family Medicine

## 2023-09-04 VITALS — BP 128/72 | HR 77 | Ht 63.0 in | Wt 173.1 lb

## 2023-09-04 DIAGNOSIS — R748 Abnormal levels of other serum enzymes: Secondary | ICD-10-CM | POA: Diagnosis not present

## 2023-09-04 DIAGNOSIS — R6884 Jaw pain: Secondary | ICD-10-CM | POA: Diagnosis not present

## 2023-09-04 DIAGNOSIS — I10 Essential (primary) hypertension: Secondary | ICD-10-CM

## 2023-09-04 DIAGNOSIS — E559 Vitamin D deficiency, unspecified: Secondary | ICD-10-CM | POA: Diagnosis not present

## 2023-09-04 DIAGNOSIS — E1169 Type 2 diabetes mellitus with other specified complication: Secondary | ICD-10-CM | POA: Diagnosis not present

## 2023-09-04 DIAGNOSIS — E785 Hyperlipidemia, unspecified: Secondary | ICD-10-CM | POA: Diagnosis not present

## 2023-09-04 DIAGNOSIS — E669 Obesity, unspecified: Secondary | ICD-10-CM | POA: Diagnosis not present

## 2023-09-04 DIAGNOSIS — K8050 Calculus of bile duct without cholangitis or cholecystitis without obstruction: Secondary | ICD-10-CM

## 2023-09-04 DIAGNOSIS — E1159 Type 2 diabetes mellitus with other circulatory complications: Secondary | ICD-10-CM | POA: Diagnosis not present

## 2023-09-04 DIAGNOSIS — Z23 Encounter for immunization: Secondary | ICD-10-CM | POA: Diagnosis not present

## 2023-09-04 NOTE — Assessment & Plan Note (Signed)
Hyperlipidemia:Low fat diet discussed and encouraged.   Lipid Panel  Lab Results  Component Value Date   CHOL 114 04/27/2023   HDL 43 04/27/2023   LDLCALC 48 04/27/2023   TRIG 128 04/27/2023   CHOLHDL 2.7 04/27/2023     Controlled, no change in medication

## 2023-09-04 NOTE — Assessment & Plan Note (Signed)
Has been evaluated by GI in the past will refer again if still elevated / worsened

## 2023-09-04 NOTE — Patient Instructions (Addendum)
Annual exam in February, call if you need me sooner  hBA1C, cmp and EGFr, cBC, tSH and vit D   Flu vaccine today  Congrats on weight loss  It is important that you exercise regularly at least 30 minutes 5 times a week. If you develop chest pain, have severe difficulty breathing, or feel very tired, stop exercising immediately and seek medical attention    Urine ACR in next 1 to 2 weeks   Use ibuprofen 800 mg sparingly as needed for right jaw pain and cool compresses, if persists then dental eval may be warranted  Thanks for choosing [facility], we consider it a privelige to serve you.

## 2023-09-04 NOTE — Assessment & Plan Note (Signed)
Intermittent right jaw pain x 2 to 3 weeks, ibuprofen as needed and cool compress, dental eval if worsens or fails to resolve

## 2023-09-04 NOTE — Assessment & Plan Note (Signed)
asymptomatic

## 2023-09-04 NOTE — Progress Notes (Signed)
Stacey Smith     MRN: [id number]      DOB: [birth year]  Chief Complaint  Patient presents with   Hypertension    F/u due for diabetic kidney evaluation.    Immunizations    Pt. Consented to flu vaccination     HPI Stacey Smith is here for follow up and re-evaluation of chronic medical conditions, medication management and review of any available recent lab and radiology data.  Preventive health is updated, specifically  Cancer screening and Immunization.   Questions or concerns regarding consultations or procedures which the PT has had in the interim are  addressed. The PT denies any adverse reactions to current medications since the last visit.  2 week h/o right jaw painhas had left  TMJ surgery in the past, sporadic  ROS Denies recent fever or chills. Denies sinus pressure, nasal congestion, ear pain or sore throat. Denies chest congestion, productive cough or wheezing. Denies chest pains, palpitations and leg swelling Denies abdominal pain, nausea, vomiting,diarrhea or constipation.   Denies dysuria, frequency, hesitancy or incontinence.  Denies headaches, seizures, numbness, or tingling. Denies depression, anxiety or insomnia. Denies skin break down or rash.   PE  BP 128/72   Pulse 77   Ht 5\' 3"  (1.6 m)   Wt 173 lb 1.9 oz (78.5 kg)   SpO2 93%   BMI 30.67 kg/m    Patient alert and oriented and in no cardiopulmonary distress.  HEENT: No facial asymmetry, EOMI,     Neck supple .No tenderness over TMJ currently  Chest: Clear to auscultation bilaterally.  CVS: S1, S2 no murmurs, no S3.Regular rate.  ABD: Soft non tender.   Ext: No edema  MS: Adequate ROM spine, shoulders, hips and knees.  Skin: Intact, no ulcerations or rash noted.  Psych: Good eye contact, normal affect. Memory intact not anxious or depressed appearing.  CNS: CN 2-12 intact, power,  normal throughout.no focal deficits noted.   Assessment & Plan  Cholelithiasis asymptomatic  Elevated  alkaline phosphatase level Has been evaluated by GI in the past will refer again if still elevated / worsened  Essential hypertension Controlled, no change in medication DASH diet and commitment to daily physical activity for a minimum of 30 minutes discussed and encouraged, as a part of hypertension management. The importance of attaining a healthy weight is also discussed.     09/04/2023   12:13 PM 09/04/2023   11:00 AM 09/04/2023   10:51 AM 05/02/2023    3:16 PM 05/02/2023    2:40 PM 04/10/2023   10:43 AM 02/07/2023    4:49 PM  BP/Weight  Systolic BP 128 138 145 110 123 141 130  Diastolic BP 72 52 82 70 76 76 78  Wt. (Lbs)   173.12  180 184   BMI   30.67 kg/m2  31.89 kg/m2 32.59 kg/m2        Hyperlipidemia associated with type 2 diabetes mellitus (HCC) Hyperlipidemia:Low fat diet discussed and encouraged.   Lipid Panel  Lab Results  Component Value Date   CHOL 114 04/27/2023   HDL 43 04/27/2023   LDLCALC 48 04/27/2023   TRIG 128 04/27/2023   CHOLHDL 2.7 04/27/2023     Controlled, no change in medication   Type 2 diabetes mellitus with other specified complication [facility]) Stacey Smith is reminded of the importance of commitment to daily physical activity for 30 minutes or more, as able and the need to limit carbohydrate intake to 30 to 60 grams per  meal to help with blood sugar control.   The need to take medication as prescribed, test blood sugar as directed, and to call between visits if there is a concern that blood sugar is uncontrolled is also discussed.   Stacey Smith is reminded of the importance of daily foot exam, annual eye examination, and good blood sugar, blood pressure and cholesterol control.     Latest Ref Rng & Units 04/27/2023    9:26 AM 01/24/2023   11:00 AM 09/04/2022   11:01 AM 04/21/2022   12:10 PM 01/26/2022   11:28 AM  Diabetic Labs  HbA1c 4.8 - 5.6 % 6.9  8.7  6.8  7.1  8.5   Micro/Creat Ratio 0 - 29 mg/g creat   5     Chol 100 - 199 mg/dL 956    213     HDL >08 mg/dL 43    49    Calc LDL 0 - 99 mg/dL 48    56    Triglycerides 0 - 149 mg/dL 657    846    Creatinine 0.57 - 1.00 mg/dL 9.62  9.52  8.41  3.24  0.88       09/04/2023   12:13 PM 09/04/2023   11:00 AM 09/04/2023   10:51 AM 05/02/2023    3:16 PM 05/02/2023    2:40 PM 04/10/2023   10:43 AM 02/07/2023    4:49 PM  BP/Weight  Systolic BP 128 138 145 110 123 141 130  Diastolic BP 72 52 82 70 76 76 78  Wt. (Lbs)   173.12  180 184   BMI   30.67 kg/m2  31.89 kg/m2 32.59 kg/m2       Latest Ref Rng & Units 12/08/2020   10:20 AM 10/15/2020   12:00 AM  Foot/eye exam completion dates  Eye Exam No Retinopathy  No Retinopathy      Foot Form Completion  Done      This result is from an external source.      Updated lab needed    Jaw pain Intermittent right jaw pain x 2 to 3 weeks, ibuprofen as needed and cool compress, dental eval if worsens or fails to resolve

## 2023-09-04 NOTE — Assessment & Plan Note (Signed)
[name] is reminded of the importance of commitment to daily physical activity for 30 minutes or more, as able and the need to limit carbohydrate intake to 30 to 60 grams per meal to help with blood sugar control.   The need to take medication as prescribed, test blood sugar as directed, and to call between visits if there is a concern that blood sugar is uncontrolled is also discussed.   [name] is reminded of the importance of daily foot exam, annual eye examination, and good blood sugar, blood pressure and cholesterol control.     Latest Ref Rng & Units 04/27/2023    9:26 AM 01/24/2023   11:00 AM 09/04/2022   11:01 AM 04/21/2022   12:10 PM 01/26/2022   11:28 AM  Diabetic Labs  HbA1c 4.8 - 5.6 % 6.9  8.7  6.8  7.1  8.5   Micro/Creat Ratio 0 - 29 mg/g creat   5     Chol 100 - 199 mg/dL 027    253    HDL >66 mg/dL 43    49    Calc LDL 0 - 99 mg/dL 48    56    Triglycerides 0 - 149 mg/dL 440    347    Creatinine 0.57 - 1.00 mg/dL 4.25  9.56  3.87  5.64  0.88       09/04/2023   12:13 PM 09/04/2023   11:00 AM 09/04/2023   10:51 AM 05/02/2023    3:16 PM 05/02/2023    2:40 PM 04/10/2023   10:43 AM 02/07/2023    4:49 PM  BP/Weight  Systolic BP 128 138 145 110 123 141 130  Diastolic BP 72 52 82 70 76 76 78  Wt. (Lbs)   173.12  180 184   BMI   30.67 kg/m2  31.89 kg/m2 32.59 kg/m2       Latest Ref Rng & Units 12/08/2020   10:20 AM 10/15/2020   12:00 AM  Foot/eye exam completion dates  Eye Exam No Retinopathy  No Retinopathy      Foot Form Completion  Done      This result is from an external source.      Updated lab needed

## 2023-09-04 NOTE — Assessment & Plan Note (Signed)
Controlled, no change in medication DASH diet and commitment to daily physical activity for a minimum of 30 minutes discussed and encouraged, as a part of hypertension management. The importance of attaining a healthy weight is also discussed.     09/04/2023   12:13 PM 09/04/2023   11:00 AM 09/04/2023   10:51 AM 05/02/2023    3:16 PM 05/02/2023    2:40 PM 04/10/2023   10:43 AM 02/07/2023    4:49 PM  BP/Weight  Systolic BP 128 138 145 110 123 141 130  Diastolic BP 72 52 82 70 76 76 78  Wt. (Lbs)   173.12  180 184   BMI   30.67 kg/m2  31.89 kg/m2 32.59 kg/m2

## 2023-09-05 LAB — CMP14+EGFR
ALT: 17 IU/L (ref 0–32)
AST: 16 IU/L (ref 0–40)
Albumin: 4 g/dL (ref 3.8–4.8)
Alkaline Phosphatase: 184 IU/L — ABNORMAL HIGH (ref 44–121)
BUN/Creatinine Ratio: 14 (ref 12–28)
BUN: 13 mg/dL (ref 8–27)
Bilirubin Total: 0.9 mg/dL (ref 0.0–1.2)
CO2: 26 mmol/L (ref 20–29)
Calcium: 9.4 mg/dL (ref 8.7–10.3)
Chloride: 103 mmol/L (ref 96–106)
Creatinine, Ser: 0.95 mg/dL (ref 0.57–1.00)
Globulin, Total: 2.4 g/dL (ref 1.5–4.5)
Glucose: 94 mg/dL (ref 70–99)
Potassium: 3.9 mmol/L (ref 3.5–5.2)
Sodium: 143 mmol/L (ref 134–144)
Total Protein: 6.4 g/dL (ref 6.0–8.5)
eGFR: 64 mL/min/{1.73_m2} (ref >59)

## 2023-09-05 LAB — CBC
Hematocrit: 36.4 % (ref 34.0–46.6)
Hemoglobin: 11.1 g/dL (ref 11.1–15.9)
MCH: 23.5 pg — ABNORMAL LOW (ref 26.6–33.0)
MCHC: 30.5 g/dL — ABNORMAL LOW (ref 31.5–35.7)
MCV: 77 fL — ABNORMAL LOW (ref 79–97)
Platelets: 311 10*3/uL (ref 150–450)
RBC: 4.72 x10E6/uL (ref 3.77–5.28)
RDW: 15.5 % — ABNORMAL HIGH (ref 11.7–15.4)
WBC: 7.1 10*3/uL (ref 3.4–10.8)

## 2023-09-05 LAB — HEMOGLOBIN A1C
Est. average glucose Bld gHb Est-mCnc: 146 mg/dL
Hgb A1c MFr Bld: 6.7 % — ABNORMAL HIGH (ref 4.8–5.6)

## 2023-09-05 LAB — VITAMIN D 25 HYDROXY (VIT D DEFICIENCY, FRACTURES): Vit D, 25-Hydroxy: 37.4 ng/mL (ref 30.0–100.0)

## 2023-09-05 LAB — TSH: TSH: 1.12 u[IU]/mL (ref 0.450–4.500)

## 2023-09-05 MED ORDER — SEMAGLUTIDE (1 MG/DOSE) 4 MG/3ML ~~LOC~~ SOPN: 1.0000 mg | PEN_INJECTOR | SUBCUTANEOUS | 3 refills | Status: DC

## 2023-09-05 NOTE — Addendum Note (Signed)
Addended by: Syliva Overman E on: 09/05/2023 07:50 AM   Modules accepted: Orders

## 2023-09-06 ENCOUNTER — Ambulatory Visit (HOSPITAL_COMMUNITY)
Admission: RE | Admit: 2023-09-06 | Discharge: 2023-09-06 | Disposition: A | Discharge status: Home / Self Care | Payer: Medicare HMO | Source: Ambulatory Visit | Attending: Family Medicine | Admitting: Family Medicine

## 2023-09-06 DIAGNOSIS — R748 Abnormal levels of other serum enzymes: Secondary | ICD-10-CM | POA: Diagnosis not present

## 2023-09-06 DIAGNOSIS — K8020 Calculus of gallbladder without cholecystitis without obstruction: Secondary | ICD-10-CM | POA: Diagnosis not present

## 2023-09-07 NOTE — Progress Notes (Unsigned)
Referring Provider: Kerri Perches, MD Primary Care Physician:  Kerri Perches, MD Primary GI Physician: Dr. Jena Gauss  No chief complaint on file.   HPI:   Stacey Smith is a 72 y.o. female presenting today with a history of   We have previously seen patient elevated alkaline phosphatase.  Prior evaluation in October 2021 with mitochondrial antibodies negative, GGT normal, alk phos isoenzymes upper limit normal for liver fraction (85%), normal bone and intestinal fraction.   RUQ ultrasound January 2022 with cholelithiasis without cholecystitis, hepatic steatosis, no focal hepatic lesion.  At her last visit with [place] in April 2022, it was advised to continue to monitor and consider liver biopsy if no other causes identified to ensure no liver cause.  Most recent labs on file 09/04/2023 with alkaline phosphatase 184, other LFTs within normal limits. Hemoglobin A1c 6.7.  RUQ ultrasound 09/06/2023 with cholelithiasis, normal CBD, hepatic steatosis.    Today:       Colonoscopy 03/09/2022: Pancolonic diverticulosis, two 3-6 mm polyps in the descending colon and hepatic flexure resected and retrieved.  Pathology with tubular adenomas.  Recommended 5-year surveillance.    Past Medical History:  Diagnosis Date   Allergy    seasonal intermittently   Arthritis    spine   Back pain    Colon polyps    Diabetes mellitus without complication (HCC)    GERD (gastroesophageal reflux disease)    Headache(784.0)    Hyperlipidemia    well controlled   Obesity    S/P colonoscopy Oct 2007   Normal rectum, diminutive polyps in the left colon cold: TUBULAR ADENOMA   TMJ (dislocation of temporomandibular joint)     Past Surgical History:  Procedure Laterality Date   ABDOMINAL HYSTERECTOMY N/A    [employer] 03/04/2021   COLONOSCOPY  10/03/2011   Procedure: COLONOSCOPY;  Surgeon: Corbin Ade, MD;  [facility];  Service: Endoscopy;  Laterality: N/A;  10:30    COLONOSCOPY N/A 11/01/2016   rourk: Diverticulosis, multiple colon polyps removed, tubular adenomas.  Next colonoscopy in November 2022.   COLONOSCOPY WITH PROPOFOL N/A 03/09/2022   Procedure: COLONOSCOPY WITH PROPOFOL;  Surgeon: Corbin Ade, MD;  Location: [facility];  Service: Endoscopy;  Laterality: N/A;  9:00 / ASA 2   CORONARY CT ANGIOGRAM  12/2018   Coronary calcium score of 0. This was 0 percentile for age & sex matched control. Normal coronary origin with right dominance. No evidence of CAD.    excision of cyst for thyroid gland non cancer  1988   POLYPECTOMY  11/01/2016   Procedure: POLYPECTOMY;  Surgeon: Corbin Ade, MD;  [facility]: [facility];  Service: Endoscopy;;  colon    POLYPECTOMY  03/09/2022   Procedure: POLYPECTOMY;  Surgeon: Corbin Ade, MD;  [facility]: [facility];  Service: Endoscopy;;   TMJ ARTHROPLASTY  09/23/2012   Procedure: TEMPOROMANDIBULAR JOINT (TMJ) ARTHROPLASTY;  Surgeon: Georgia Lopes, DDS;  Location: [facility] OR;  Service: Oral Surgery;  Laterality: Left;  Temporomandibular joint arthrotomy and meniscectomy   TONSILLECTOMY  1960   TOTAL ABDOMINAL HYSTERECTOMY  1985   right ovary fibroids    TUBAL LIGATION N/A    [employer]reesia 03/04/2021    Current Outpatient Medications  Medication Sig Dispense Refill   aspirin EC 81 MG tablet Take 1 tablet (81 mg total) by mouth daily. Swallow whole. 30 tablet 11   atorvastatin (LIPITOR) 80 MG tablet TAKE 1 TABLET BY MOUTH EVERY DAY 90  tablet 3   Cholecalciferol (VITAMIN D3) 50 MCG (2000 UT) TABS Take 2,000 Units by mouth daily.     famotidine (PEPCID) 40 MG tablet TAKE 1 TABLET BY MOUTH EVERY DAY AS NEEDED FOR HEARTBURN 90 tablet 1   fluticasone (FLONASE) 50 MCG/ACT nasal spray Place 2 sprays into both nostrils daily as needed. For allergies 48 mL 1   ibuprofen (ADVIL) 800 MG tablet Take 1 tablet (800 mg total) by mouth every 8 (eight) hours as needed. 30 tablet 1   Lancets (ONETOUCH DELICA PLUS LANCET30G) MISC USE  AS DIRECTED 100 each 2   olmesartan (BENICAR) 20 MG tablet Take 1 tablet (20 mg total) by mouth daily. 90 tablet 3   ONETOUCH VERIO test strip Use as instructed twice daily dx e11.65 200 strip 5   Semaglutide, 1 MG/DOSE, 4 MG/3ML SOPN Inject 1 mg as directed once a week. 3 mL 3   No current facility-administered medications for this visit.    Allergies as of 09/10/2023 - Review Complete 09/04/2023  Allergen Reaction Noted   Metformin and related Other (See Comments) 11/03/2014   Tessalon perles [benzonatate] Dermatitis 07/12/2015    Family History  Problem Relation Age of Onset   Stroke Mother    Hypertension Mother    Cancer Mother        breast    Glaucoma Mother    Asthma Mother    Coronary artery disease Father    Diabetes Father    Heart failure Father    Kidney disease Father    Diabetes Sister    Hypertension Sister    Diabetes Sister    Diabetes Sister    Depression Sister    Colon cancer Sister    Cancer - Colon Sister        colon    Thyroid disease Sister    Bipolar disorder Sister     Social History   Socioeconomic History   Marital status: Divorced    Spouse name: Not on file   Number of children: 2   Years of education: Not on file   Highest education level: 12th grade  Occupational History   Occupation: Engineer, manufacturing systems: [employer]    Comment: Armed forces operational officer  Tobacco Use   Smoking status: Never   Smokeless tobacco: Never  Vaping Use   Vaping status: Never Used  Substance and Sexual Activity   Alcohol use: Yes    Comment: occasionally    Drug use: No   Sexual activity: Not Currently  Other Topics Concern   Not on file  Social History Narrative   Recently lost her long-term partner (2019)   Social Determinants of Health   Financial Resource Strain: Low Risk  (04/06/2023)   Overall Financial Resource Strain (CARDIA)    Difficulty of Paying Living Expenses: Not hard at all  Food Insecurity: No Food Insecurity (04/06/2023)    Hunger Vital Sign    Worried About Running Out of Food in the Last Year: Never true    Ran Out of Food in the Last Year: Never true  Transportation Needs: No Transportation Needs (04/06/2023)   PRAPARE - Administrator, Civil Service (Medical): No    Lack of Transportation (Non-Medical): No  Physical Activity: Insufficiently Active (04/06/2023)   Exercise Vital Sign    Days of Exercise per Week: 3 days    Minutes of Exercise per Session: 20 min  Stress: No Stress Concern Present (04/06/2023)   [place]  Institute of Occupational Health - Occupational Stress Questionnaire    Feeling of Stress : Not at all  Social Connections: Moderately Integrated (04/06/2023)   Social Connection and Isolation Panel [NHANES]    Frequency of Communication with Friends and Family: More than three times a week    Frequency of Social Gatherings with Friends and Family: More than three times a week    Attends Religious Services: More than 4 times per year    Active Member of [employer] or Organizations: Yes    Attends Engineer, structural: More than 4 times per year    Marital Status: Divorced    Review of Systems: Gen: Denies fever, chills, anorexia. Denies fatigue, weakness, weight loss.  CV: Denies chest pain, palpitations, syncope, peripheral edema, and claudication. Resp: Denies dyspnea at rest, cough, wheezing, coughing up blood, and pleurisy. GI: Denies vomiting blood, jaundice, and fecal incontinence.   Denies dysphagia or odynophagia. Derm: Denies rash, itching, dry skin Psych: Denies depression, anxiety, memory loss, confusion. No homicidal or suicidal ideation.  Heme: Denies bruising, bleeding, and enlarged lymph nodes.  Physical Exam: There were no vitals taken for this visit. General:   Alert and oriented. No distress noted. Pleasant and cooperative.  Head:  Normocephalic and atraumatic. Eyes:  Conjuctiva clear without scleral icterus. Heart:  S1, S2 present without murmurs  appreciated. Lungs:  Clear to auscultation bilaterally. No wheezes, rales, or rhonchi. No distress.  Abdomen:  +BS, soft, non-tender and non-distended. No rebound or guarding. No HSM or masses noted. Msk:  Symmetrical without gross deformities. Normal posture. Extremities:  Without edema. Neurologic:  Alert and  oriented x4 Psych:  Normal mood and affect.    Assessment:     Plan:  ***   Ermalinda Memos, PA-C [facility] 09/10/2023

## 2023-09-10 ENCOUNTER — Telehealth: Payer: Self-pay | Admitting: *Deleted

## 2023-09-10 ENCOUNTER — Encounter: Payer: Self-pay | Admitting: *Deleted

## 2023-09-10 ENCOUNTER — Encounter: Payer: Self-pay | Admitting: Gastroenterology

## 2023-09-10 ENCOUNTER — Ambulatory Visit: Payer: Medicare HMO | Admitting: Gastroenterology

## 2023-09-10 VITALS — BP 127/77 | HR 77 | Temp 98.0°F | Ht 63.0 in | Wt 176.6 lb

## 2023-09-10 DIAGNOSIS — K760 Fatty (change of) liver, not elsewhere classified: Secondary | ICD-10-CM

## 2023-09-10 DIAGNOSIS — R748 Abnormal levels of other serum enzymes: Secondary | ICD-10-CM | POA: Diagnosis not present

## 2023-09-10 NOTE — Patient Instructions (Signed)
Please have blood work completed at [facility].  We will arrange for you to have an MRI of your abdomen at [facility].  We will call you with results and further recommendations.   As we discussed, you do have evidence of fatty liver which could be the cause of your elevated alkaline phosphatase.  Instructions for fatty liver: Recommend 1-2# weight loss per week until ideal body weight through exercise & diet. Low fat/cholesterol diet.   Avoid sweets, sodas, fruit juices, sweetened beverages like tea, etc. Gradually increase exercise from 15 min daily up to 1 hr per day 5 days/week. Avoid alcohol use.   It was nice to meet you today!  Ermalinda Memos, PA-C [facility]

## 2023-09-10 NOTE — Telephone Encounter (Signed)
[employer] PA for MRI/MRCP: approval # [id number] DOS: 09/10/23-03/08/24

## 2023-09-11 DIAGNOSIS — R748 Abnormal levels of other serum enzymes: Secondary | ICD-10-CM | POA: Diagnosis not present

## 2023-09-13 LAB — MITOCHONDRIAL ANTIBODIES: Mitochondrial Ab: <20 Units (ref 0.0–20.0)

## 2023-09-13 LAB — ALKALINE PHOSPHATASE, ISOENZYMES
Alkaline Phosphatase: 172 IU/L — ABNORMAL HIGH (ref 44–121)
BONE FRACTION: 12 % — ABNORMAL LOW (ref 14–68)
INTESTINAL FRAC.: 12 % (ref 0–18)
LIVER FRACTION: 76 % (ref 18–85)

## 2023-09-13 LAB — ANA W/REFLEX IF POSITIVE: Anti Nuclear Antibody (ANA): NEGATIVE

## 2023-09-13 LAB — ANTI-SMOOTH MUSCLE ANTIBODY, IGG: Smooth Muscle Ab: 7 Units (ref 0–19)

## 2023-09-13 LAB — GAMMA GT: GGT: 17 IU/L (ref 0–60)

## 2023-09-17 ENCOUNTER — Ambulatory Visit (HOSPITAL_COMMUNITY): Admission: RE | Admit: 2023-09-17 | Payer: Medicare HMO | Source: Ambulatory Visit

## 2023-10-01 ENCOUNTER — Telehealth: Payer: Self-pay | Admitting: Family Medicine

## 2023-10-01 NOTE — Telephone Encounter (Signed)
Patient calling needing her Ozepmic called in- says she only has one pen left.Please advise Thank you

## 2023-10-02 ENCOUNTER — Other Ambulatory Visit: Payer: Self-pay

## 2023-10-02 MED ORDER — SEMAGLUTIDE (1 MG/DOSE) 4 MG/3ML ~~LOC~~ SOPN: 1.0000 mg | PEN_INJECTOR | SUBCUTANEOUS | 3 refills | Status: DC

## 2023-10-02 NOTE — Telephone Encounter (Signed)
Refills sent

## 2023-10-12 ENCOUNTER — Telehealth: Payer: Self-pay | Admitting: Family Medicine

## 2023-10-12 NOTE — Telephone Encounter (Signed)
Pt called back in regard to refill on Semaglutide, 1 MG/DOSE, 4 MG/3ML SOPN [[id number]]  Pt needs med sent in to [facility] not to [facility] , she is on  patient assistance with [facility]  Wants a call back when medication is sent   This is the 3rd week pt has not had medication

## 2023-10-15 ENCOUNTER — Ambulatory Visit (INDEPENDENT_AMBULATORY_CARE_PROVIDER_SITE_OTHER): Payer: Medicare HMO

## 2023-10-15 VITALS — Ht 63.0 in | Wt 171.0 lb

## 2023-10-15 DIAGNOSIS — Z0000 Encounter for general adult medical examination without abnormal findings: Secondary | ICD-10-CM | POA: Diagnosis not present

## 2023-10-15 NOTE — Progress Notes (Signed)
Because this visit was a virtual/telehealth visit,  certain criteria was not obtained, such a blood pressure, CBG if applicable, and timed get up and go. Any medications not marked as "taking" were not mentioned during the medication reconciliation part of the visit. Any vitals not documented were not able to be obtained due to this being a telehealth visit or patient was unable to self-report a recent blood pressure reading due to a lack of equipment at home via telehealth. Vitals that have been documented are verbally provided by the patient.   Subjective:   Stacey Smith is a 72 y.o. female who presents for Medicare Annual (Subsequent) preventive examination.  Visit Complete: Virtual I connected with  Stacey Smith on 10/15/23 by a audio enabled telemedicine application and verified that I am speaking with the correct person using two identifiers.  Patient Location: Home  Provider Location: Home Office  I discussed the limitations of evaluation and management by telemedicine. The patient expressed understanding and agreed to proceed.  Vital Signs: Because this visit was a virtual/telehealth visit, some criteria may be missing or patient reported. Any vitals not documented were not able to be obtained and vitals that have been documented are patient reported.  Patient Medicare AWV questionnaire was completed by the patient on 10/13/2023; I have confirmed that all information answered by patient is correct and no changes since this date.  Cardiac Risk Factors include: advanced age (>32men, >73 women);diabetes mellitus;dyslipidemia;hypertension;sedentary lifestyle;obesity (BMI >30kg/m2)     Objective:    Today's Vitals   10/11/23 1432 10/15/23 1059  Weight:  171 lb (77.6 kg)  Height:  5\' 3"  (1.6 m)  PainSc: 0-No pain    Body mass index is 30.29 kg/m.     10/15/2023   10:58 AM 09/29/2022    2:53 PM 09/20/2022   10:10 AM 03/09/2022    7:43 AM 08/25/2021    2:20 PM 08/19/2020   10:12  AM 06/04/2018   12:23 PM  Advanced Directives  Does Patient Have a Medical Advance Directive? Yes No Yes Yes Yes Yes No  Type of Estate agent of [place];Living will  Healthcare Power of Stacey Smith;Out of facility DNR (pink MOST or yellow form);Living will Healthcare Power of Stacey Smith;Living will Healthcare Power of Stacey Smith;Living will    Does patient want to make changes to medical advance directive?      No - Patient declined   Copy of Healthcare Power of Attorney in Chart? No - copy requested  No - copy requested No - copy requested     Would patient like information on creating a medical advance directive?  No - Patient declined     Yes (MAU/Ambulatory/Procedural Areas - Information given)    Current Medications (verified) Outpatient Encounter Medications as of 10/15/2023  Medication Sig   aspirin EC 81 MG tablet Take 1 tablet (81 mg total) by mouth daily. Swallow whole.   atorvastatin (LIPITOR) 80 MG tablet TAKE 1 TABLET BY MOUTH EVERY DAY   Cholecalciferol (VITAMIN D3) 50 MCG (2000 UT) TABS Take 2,000 Units by mouth daily.   famotidine (PEPCID) 40 MG tablet TAKE 1 TABLET BY MOUTH EVERY DAY AS NEEDED FOR HEARTBURN   fluticasone (FLONASE) 50 MCG/ACT nasal spray Place 2 sprays into both nostrils daily as needed. For allergies   ibuprofen (ADVIL) 800 MG tablet Take 1 tablet (800 mg total) by mouth every 8 (eight) hours as needed.   Lancets (ONETOUCH DELICA PLUS LANCET30G) MISC USE AS DIRECTED   olmesartan (  BENICAR) 20 MG tablet Take 1 tablet (20 mg total) by mouth daily.   ONETOUCH VERIO test strip Use as instructed twice daily dx e11.65   Semaglutide, 1 MG/DOSE, 4 MG/3ML SOPN Inject 1 mg as directed once a week.   No facility-administered encounter medications on file as of 10/15/2023.    Allergies (verified) Metformin and related and Tessalon perles [benzonatate]   History: Past Medical History:  Diagnosis Date   Allergy    seasonal intermittently    Arthritis    spine   Back pain    Colon polyps    Diabetes mellitus without complication (HCC)    GERD (gastroesophageal reflux disease)    Headache(784.0)    Hyperlipidemia    well controlled   Obesity    S/P colonoscopy 09/2006   Normal rectum, diminutive polyps in the left colon cold: TUBULAR ADENOMA   TMJ (dislocation of temporomandibular joint)    Past Surgical History:  Procedure Laterality Date   ABDOMINAL HYSTERECTOMY N/A    [employer] 03/04/2021   COLONOSCOPY  10/03/2011   Procedure: COLONOSCOPY;  Surgeon: Corbin Ade, MD;  Location: [facility];  Service: Endoscopy;  Laterality: N/A;  10:30   COLONOSCOPY N/A 11/01/2016   rourk: Diverticulosis, multiple colon polyps removed, tubular adenomas.  Next colonoscopy in November 2022.   COLONOSCOPY WITH PROPOFOL N/A 03/09/2022   Procedure: COLONOSCOPY WITH PROPOFOL;  Surgeon: Corbin Ade, MD;  [facility]: [facility];  Service: Endoscopy;  Laterality: N/A;  9:00 / ASA 2   CORONARY CT ANGIOGRAM  12/2018   Coronary calcium score of 0. This was 0 percentile for age & sex matched control. Normal coronary origin with right dominance. No evidence of CAD.    excision of cyst for thyroid gland non cancer  1988   POLYPECTOMY  11/01/2016   Procedure: POLYPECTOMY;  Surgeon: Corbin Ade, MD;  [facility]: [facility];  Service: Endoscopy;;  colon    POLYPECTOMY  03/09/2022   Procedure: POLYPECTOMY;  Surgeon: Corbin Ade, MD;  [facility];  Service: Endoscopy;;   TMJ ARTHROPLASTY  09/23/2012   Procedure: TEMPOROMANDIBULAR JOINT (TMJ) ARTHROPLASTY;  Surgeon: Georgia Lopes, DDS;  Location: [facility] OR;  Service: Oral Surgery;  Laterality: Left;  Temporomandibular joint arthrotomy and meniscectomy   TONSILLECTOMY  1960   TOTAL ABDOMINAL HYSTERECTOMY  1985   right ovary fibroids    TUBAL LIGATION N/A    [employer]reesia 03/04/2021   Family History  Problem Relation Age of Onset   Stroke Mother    Hypertension Mother    Cancer  Mother        breast    Glaucoma Mother    Asthma Mother    Coronary artery disease Father    Diabetes Father    Heart failure Father    Kidney disease Father    Diabetes Sister    Hypertension Sister    Diabetes Sister    Depression Sister    Diabetes Sister    Depression Sister    Colon cancer Sister    Cancer - Colon Sister        colon    Cancer Sister    Thyroid disease Sister    Bipolar disorder Sister    Social History   Socioeconomic History   Marital status: Divorced    Spouse name: Not on file   Number of children: 2   Years of education: Not on file   Highest education level: 12th grade  Occupational  History   Occupation: Engineer, manufacturing systems: [employer]    Comment: [employer]  Tobacco Use   Smoking status: Never   Smokeless tobacco: Never  Vaping Use   Vaping status: Never Used  Substance and Sexual Activity   Alcohol use: Yes    Alcohol/week: 2.0 standard drinks of alcohol    Types: 2 Shots of liquor per week    Comment: Occasionally   Drug use: No   Sexual activity: Not Currently    Birth control/protection: None  Other Topics Concern   Not on file  Social History Narrative   Recently lost her long-term partner (2019)   Social Determinants of Health   Financial Resource Strain: Low Risk  (10/11/2023)   Overall Financial Resource Strain (CARDIA)    Difficulty of Paying Living Expenses: Not hard at all  Food Insecurity: No Food Insecurity (10/11/2023)   Hunger Vital Sign    Worried About Running Out of Food in the Last Year: Never true    Ran Out of Food in the Last Year: Never true  Transportation Needs: No Transportation Needs (10/11/2023)   PRAPARE - Administrator, Civil Service (Medical): No    Lack of Transportation (Non-Medical): No  Physical Activity: Insufficiently Active (10/11/2023)   Exercise Vital Sign    Days of Exercise per Week: 3 days    Minutes of Exercise per Session: 30 min  Stress: No Stress  Concern Present (10/11/2023)   [employer] of Occupational Health - Occupational Stress Questionnaire    Feeling of Stress : Not at all  Social Connections: Moderately Integrated (10/11/2023)   Social Connection and Isolation Panel [NHANES]    Frequency of Communication with Friends and Family: More than three times a week    Frequency of Social Gatherings with Friends and Family: More than three times a week    Attends Religious Services: More than 4 times per year    Active Member of [employer] or Organizations: Yes    Attends Engineer, structural: More than 4 times per year    Marital Status: Divorced    Tobacco Counseling Counseling given: Yes   Clinical Intake:  Pre-visit preparation completed: Yes  Pain : No/denies pain Pain Score: 0-No pain     BMI - recorded: 30.29 Nutritional Status: BMI > 30  Obese Nutritional Risks: None Diabetes: Yes CBG done?: No (telehealth visit.) Did pt. bring in CBG monitor from home?: No  How often do you need to have someone help you when you read instructions, pamphlets, or other written materials from your doctor or pharmacy?: 1 - Never  Interpreter Needed?: No  Information entered by :: Abby Crissie Aloi, CMA   Activities of Daily Living    10/11/2023    2:32 PM  In your present state of health, do you have any difficulty performing the following activities:  Hearing? 0  Vision? 0  Difficulty concentrating or making decisions? 0  Walking or climbing stairs? 0  Dressing or bathing? 0  Doing errands, shopping? 0  Preparing Food and eating ? N  Using the Toilet? N  In the past six months, have you accidently leaked urine? N  Do you have problems with loss of bowel control? N  Managing your Medications? N  Managing your Finances? N  Housekeeping or managing your Housekeeping? N    Patient Care Team: Kerri Perches, MD as PCP - General Herbie [place] Piedad Climes, MD as PCP - Cardiology (Cardiology) Jena Gauss,  Gerrit Friends, MD  (Gastroenterology) Gavin Pound, [facility] (Inactive) (Pharmacist)  Indicate any recent Medical Services you may have received from other than [facility] providers in the past year (date may be approximate).     Assessment:   This is a routine wellness examination for Mikayela.  Hearing/Vision screen Hearing Screening - Comments:: Patient denies any hearing difficulties.   Vision Screening - Comments:: Patient is up to date on yearly exams and sees [facility] in [place]   Goals Addressed             This Visit's Progress    Patient Stated       Remain healthy and active        Depression Screen    10/15/2023   11:01 AM 09/04/2023   10:56 AM 05/02/2023    2:41 PM 02/07/2023    4:21 PM 09/20/2022   10:11 AM 09/08/2022   11:03 AM 04/26/2022    1:39 PM  PHQ 2/9 Scores  PHQ - 2 Score 0 0 0 0 0 0 0  PHQ- 9 Score 0 1 1 3        Fall Risk    10/11/2023    2:32 PM 09/04/2023   11:00 AM 09/04/2023   10:56 AM 05/02/2023    2:41 PM 02/07/2023    4:21 PM  Fall Risk   Falls in the past year? 0 0 0 0 1  Number falls in past yr: 0   0 0  Injury with Fall? 1   0 1  Risk for fall due to : No Fall Risks   No Fall Risks History of fall(s);Impaired balance/gait  Follow up Falls prevention discussed   Falls evaluation completed Falls evaluation completed    MEDICARE RISK AT HOME: Medicare Risk at Home Any stairs in or around the home?: No If so, are there any without handrails?: No Home free of loose throw rugs in walkways, pet beds, electrical cords, etc?: Yes Adequate lighting in your home to reduce risk of falls?: Yes Life alert?: No Use of a cane, walker or w/c?: No Grab bars in the bathroom?: Yes Shower chair or bench in shower?: Yes Elevated toilet seat or a handicapped toilet?: No  TIMED UP AND GO:  Was the test performed?  No    Cognitive Function:        10/15/2023   11:00 AM 09/20/2022   10:14 AM 08/25/2021    2:22 PM 08/19/2020   10:14 AM 07/08/2019    2:50 PM   6CIT Screen  What Year? 0 points 0 points 0 points 0 points 0 points  What month? 0 points 0 points 0 points 0 points 0 points  What time? 0 points 0 points 0 points 0 points 0 points  Count back from 20 0 points 0 points 0 points 0 points 0 points  Months in reverse 0 points 0 points 0 points 0 points 0 points  Repeat phrase 0 points 0 points 0 points 0 points 0 points  Total Score 0 points 0 points 0 points 0 points 0 points    Immunizations Immunization History  Administered Date(s) Administered   Fluad Quad(high Dose 65+) 09/08/2019, 10/04/2021, 09/08/2022   Fluad Trivalent(High Dose 65+) 09/04/2023   Influenza Split 11/23/2011, 10/23/2014   Influenza Whole 01/30/2008, 09/27/2010, 09/24/2012   Influenza, High Dose Seasonal PF 11/06/2018   Influenza,inj,Quad PF,6+ Mos 11/03/2014, 09/10/2015, 08/25/2016, 11/05/2017, 08/19/2020   Influenza,inj,quad, With Preservative 10/25/2017   PFIZER Comirnaty(Gray Top)Covid-19 Tri-Sucrose Vaccine  05/10/2021   PFIZER(Purple Top)SARS-COV-2 Vaccination 01/22/2020, 02/13/2020, 09/20/2020   Pfizer Covid-19 Vaccine Bivalent Booster 47yrs & up 10/04/2021   Pfizer(Comirnaty)Fall Seasonal Vaccine 12 years and older 10/17/2022, 10/04/2023   Pneumococcal Conjugate PCV 7 10/25/2017   Pneumococcal Conjugate-13 03/04/2015, 10/25/2017, 11/01/2017   Pneumococcal Polysaccharide-23 11/25/2018   Pneumococcal-Unspecified 10/25/2017   Td 05/24/2004   Td (Adult), 2 Lf Tetanus Toxid, Preservative Free 05/24/2004   Tdap 07/01/2014   Zoster Recombinant(Shingrix) 10/25/2021, 01/26/2022   Zoster, Live 01/16/2013    TDAP status: Up to date  Flu Vaccine status: Up to date  Pneumococcal vaccine status: Up to date  Covid-19 vaccine status: Completed vaccines  Qualifies for Shingles Vaccine? Yes   Zostavax completed Yes   Shingrix Completed?: Yes  Screening Tests Health Maintenance  Topic Date Due   OPHTHALMOLOGY EXAM  10/12/2022   Diabetic kidney  evaluation - Urine ACR  09/05/2023   FOOT EXAM  02/13/2024   Medicare Annual Wellness (AWV)  02/13/2024   HEMOGLOBIN A1C  03/03/2024   DTaP/Tdap/Td (4 - Td or Tdap) 07/01/2024   Diabetic kidney evaluation - eGFR measurement  09/03/2024   MAMMOGRAM  07/31/2025   Colonoscopy  03/10/2027   Pneumonia Vaccine 33+ Years old  Completed   INFLUENZA VACCINE  Completed   DEXA SCAN  Completed   COVID-19 Vaccine  Completed   Hepatitis C Screening  Completed   Zoster Vaccines- Shingrix  Completed   HPV VACCINES  Aged Out    Health Maintenance  Health Maintenance Due  Topic Date Due   OPHTHALMOLOGY EXAM  10/12/2022   Diabetic kidney evaluation - Urine ACR  09/05/2023    Colorectal cancer screening: Type of screening: Colonoscopy. Completed 03/09/2022. Repeat every 5 years  Mammogram status: Completed 08/01/2023. Repeat every year  Bone Density status: Completed 12/09/2018. Results reflect: Bone density results: NORMAL. Repeat every 5 years.  Lung Cancer Screening: (Low Dose CT Chest recommended if Age 53-80 years, 20 pack-year currently smoking OR have quit w/in 15years.) does not qualify.   Lung Cancer Screening Referral: na  Additional Screening:  Hepatitis C Screening: does not qualify; Completed 12/10/2015  Vision Screening: Recommended annual ophthalmology exams for early detection of glaucoma and other disorders of the eye. Is the patient up to date with their annual eye exam?  Yes  Who is the provider or what is the name of the office in which the patient attends annual eye exams? [facility] [place] If pt is not established with a provider, would they like to be referred to a provider to establish care? No .   Dental Screening: Recommended annual dental exams for proper oral hygiene  Diabetic Foot Exam: Diabetic Foot Exam: Completed 02/12/2023  Community Resource Referral / Chronic Care Management: CRR required this visit?  No   CCM required this visit?  No     Plan:      I have personally reviewed and noted the following in the patient's chart:   Medical and social history Use of alcohol, tobacco or illicit drugs  Current medications and supplements including opioid prescriptions. Patient is not currently taking opioid prescriptions. Functional ability and status Nutritional status Physical activity Advanced directives List of other physicians Hospitalizations, surgeries, and ER visits in previous 12 months Vitals Screenings to include cognitive, depression, and falls Referrals and appointments  In addition, I have reviewed and discussed with patient certain preventive protocols, quality metrics, and best practice recommendations. A written personalized care plan for preventive services as well as general  preventive health recommendations were provided to patient.     Jordan Hawks Jubal Rademaker, CMA   10/15/2023   After Visit Summary: (MyChart) Due to this being a telephonic visit, the after visit summary with patients personalized plan was offered to patient via MyChart

## 2023-10-15 NOTE — Patient Instructions (Signed)
Stacey Smith , Thank you for taking time to come for your Medicare Wellness Visit. I appreciate your ongoing commitment to your health goals. Please review the following plan we discussed and let me know if I can assist you in the future.   Referrals/Orders/Follow-Ups/Clinician Recommendations:  Next Medicare Annual Wellness Visit: October 15, 2024 at 10:40 am virtual visit  This is a list of the screening recommended for you and due dates:  Health Maintenance  Topic Date Due   Eye exam for diabetics  10/12/2022   Yearly kidney health urinalysis for diabetes  09/05/2023   DEXA scan (bone density measurement)  12/10/2023   Complete foot exam   02/13/2024   Hemoglobin A1C  03/03/2024   DTaP/Tdap/Td vaccine (4 - Td or Tdap) 07/01/2024   Mammogram  07/31/2024   Yearly kidney function blood test for diabetes  09/03/2024   Medicare Annual Wellness Visit  10/14/2024   Colon Cancer Screening  03/10/2027   Pneumonia Vaccine  Completed   Flu Shot  Completed   COVID-19 Vaccine  Completed   Hepatitis C Screening  Completed   Zoster (Shingles) Vaccine  Completed   HPV Vaccine  Aged Out    Advanced directives: (Copy Requested) Please bring a copy of your health care power of attorney and living will to the office to be added to your chart at your convenience.  Next Medicare Annual Wellness Visit scheduled for next year: Yes Preventive Care 15 Years and Older, Female Preventive care refers to lifestyle choices and visits with your health care provider that can promote health and wellness. Preventive care visits are also called wellness exams. What can I expect for my preventive care visit? Counseling Your health care provider may ask you questions about your: Medical history, including: Past medical problems. Family medical history. Pregnancy and menstrual history. History of falls. Current health, including: Memory and ability to understand (cognition). Emotional well-being. Home life and  relationship well-being. Sexual activity and sexual health. Lifestyle, including: Alcohol, nicotine or tobacco, and drug use. Access to firearms. Diet, exercise, and sleep habits. Work and work Astronomer. Sunscreen use. Safety issues such as seatbelt and bike helmet use. Physical exam Your health care provider will check your: Height and weight. These may be used to calculate your BMI (body mass index). BMI is a measurement that tells if you are at a healthy weight. Waist circumference. This measures the distance around your waistline. This measurement also tells if you are at a healthy weight and may help predict your risk of certain diseases, such as type 2 diabetes and high blood pressure. Heart rate and blood pressure. Body temperature. Skin for abnormal spots. What immunizations do I need?  Vaccines are usually given at various ages, according to a schedule. Your health care provider will recommend vaccines for you based on your age, medical history, and lifestyle or other factors, such as travel or where you work. What tests do I need? Screening Your health care provider may recommend screening tests for certain conditions. This may include: Lipid and cholesterol levels. Hepatitis C test. Hepatitis B test. HIV (human immunodeficiency virus) test. STI (sexually transmitted infection) testing, if you are at risk. Lung cancer screening. Colorectal cancer screening. Diabetes screening. This is done by checking your blood sugar (glucose) after you have not eaten for a while (fasting). Mammogram. Talk with your health care provider about how often you should have regular mammograms. BRCA-related cancer screening. This may be done if you have a family history of  breast, ovarian, tubal, or peritoneal cancers. Bone density scan. This is done to screen for osteoporosis. Talk with your health care provider about your test results, treatment options, and if necessary, the need for more  tests. Follow these instructions at home: Eating and drinking  Eat a diet that includes fresh fruits and vegetables, whole grains, lean protein, and low-fat dairy products. Limit your intake of foods with high amounts of sugar, saturated fats, and salt. Take vitamin and mineral supplements as recommended by your health care provider. Do not drink alcohol if your health care provider tells you not to drink. If you drink alcohol: Limit how much you have to 0-1 drink a day. Know how much alcohol is in your drink. In the [place] one drink equals one 12 oz bottle of beer (355 mL), one 5 oz glass of wine (148 mL), or one 1 oz glass of hard liquor (44 mL). Lifestyle Brush your teeth every morning and night with fluoride toothpaste. Floss one time each day. Exercise for at least 30 minutes 5 or more days each week. Do not use any products that contain nicotine or tobacco. These products include cigarettes, chewing tobacco, and vaping devices, such as e-cigarettes. If you need help quitting, ask your health care provider. Do not use drugs. If you are sexually active, practice safe sex. Use a condom or other form of protection in order to prevent STIs. Take aspirin only as told by your health care provider. Make sure that you understand how much to take and what form to take. Work with your health care provider to find out whether it is safe and beneficial for you to take aspirin daily. Ask your health care provider if you need to take a cholesterol-lowering medicine (statin). Find healthy ways to manage stress, such as: Meditation, yoga, or listening to music. Journaling. Talking to a trusted person. Spending time with friends and family. Minimize exposure to UV radiation to reduce your risk of skin cancer. Safety Always wear your seat belt while driving or riding in a vehicle. Do not drive: If you have been drinking alcohol. Do not ride with someone who has been drinking. When you are tired or  distracted. While texting. If you have been using any mind-altering substances or drugs. Wear a helmet and other protective equipment during sports activities. If you have firearms in your house, make sure you follow all gun safety procedures. What's next? Visit your health care provider once a year for an annual wellness visit. Ask your health care provider how often you should have your eyes and teeth checked. Stay up to date on all vaccines. This information is not intended to replace advice given to you by your health care provider. Make sure you discuss any questions you have with your health care provider. Document Revised: 06/08/2021 Document Reviewed: 06/08/2021 Elsevier Patient Education  2024 [employer]. Understanding Your Risk for Falls Millions of people have serious injuries from falls each year. It is important to understand your risk of falling. Talk with your health care provider about your risk and what you can do to lower it. If you do have a serious fall, make sure to tell your provider. Falling once raises your risk of falling again. How can falls affect me? Serious injuries from falls are common. These include: Broken bones, such as hip fractures. Head injuries, such as traumatic brain injuries (TBI) or concussions. A fear of falling can cause you to avoid activities and stay at home. This can  make your muscles weaker and raise your risk for a fall. What can increase my risk? There are a number of risk factors that increase your risk for falling. The more risk factors you have, the higher your risk of falling. Serious injuries from a fall happen most often to people who are older than 71 years old. Teenagers and young adults ages 73-29 are also at higher risk. Common risk factors include: Weakness in the lower body. Being generally weak or confused due to long-term (chronic) illness. Dizziness or balance problems. Poor vision. Medicines that cause dizziness or  drowsiness. These may include: Medicines for your blood pressure, heart, anxiety, insomnia, or swelling (edema). Pain medicines. Muscle relaxants. Other risk factors include: Drinking alcohol. Having had a fall in the past. Having foot pain or wearing improper footwear. Working at a dangerous job. Having any of the following in your home: Tripping hazards, such as floor clutter or loose rugs. Poor lighting. Pets. Having dementia or memory loss. What actions can I take to lower my risk of falling?     Physical activity Stay physically fit. Do strength and balance exercises. Consider taking a regular class to build strength and balance. Yoga and tai chi are good options. Vision Have your eyes checked every year and your prescription for glasses or contacts updated as needed. Shoes and walking aids Wear non-skid shoes. Wear shoes that have rubber soles and low heels. Do not wear high heels. Do not walk around the house in socks or slippers. Use a cane or walker as told by your provider. Home safety Attach secure railings on both sides of your stairs. Install grab bars for your bathtub, shower, and toilet. Use a non-skid mat in your bathtub or shower. Attach bath mats securely with double-sided, non-slip rug tape. Use good lighting in all rooms. Keep a flashlight near your bed. Make sure there is a clear path from your bed to the bathroom. Use night-lights. Do not use throw rugs. Make sure all carpeting is taped or tacked down securely. Remove all clutter from walkways and stairways, including extension cords. Repair uneven or broken steps and floors. Avoid walking on icy or slippery surfaces. Walk on the grass instead of on icy or slick sidewalks. Use ice melter to get rid of ice on walkways in the winter. Use a cordless phone. Questions to ask your health care provider Can you help me check my risk for a fall? Do any of my medicines make me more likely to fall? Should I take a  vitamin D supplement? What exercises can I do to improve my strength and balance? Should I make an appointment to have my vision checked? Do I need a bone density test to check for weak bones (osteoporosis)? Would it help to use a cane or a walker? Where to find more information Centers for Disease Control and Prevention, STEADI: TonerPromos.no Community-Based Fall Prevention Programs: TonerPromos.no [employer] on Aging: BaseRingTones.pl Contact a health care provider if: You fall at home. You are afraid of falling at home. You feel weak, drowsy, or dizzy. This information is not intended to replace advice given to you by your health care provider. Make sure you discuss any questions you have with your health care provider. Document Revised: 08/14/2022 Document Reviewed: 08/14/2022 Elsevier Patient Education  2024 [employer].

## 2023-10-19 ENCOUNTER — Telehealth: Payer: Self-pay | Admitting: Family Medicine

## 2023-10-19 NOTE — Telephone Encounter (Signed)
Patient aware it is here ready for pickup

## 2023-10-19 NOTE — Telephone Encounter (Signed)
Patient called checking to see if ozempic has arrived at our office for pick up call back # [phone].

## 2023-10-25 LAB — HM DIABETES EYE EXAM

## 2023-11-01 ENCOUNTER — Telehealth: Payer: Self-pay | Admitting: Family Medicine

## 2023-11-01 NOTE — Telephone Encounter (Signed)
Novo Nordisk assistance program application  Copied Noted Sleeved (put originally in provider box)

## 2023-11-16 ENCOUNTER — Telehealth: Payer: Self-pay | Admitting: Family Medicine

## 2023-11-16 NOTE — Telephone Encounter (Signed)
Pt had DRS with [facility] in [place]

## 2023-11-20 ENCOUNTER — Encounter: Payer: Self-pay | Admitting: Family Medicine

## 2023-11-20 NOTE — Telephone Encounter (Signed)
Date has been updated for 10/25/2023

## 2023-11-28 ENCOUNTER — Telehealth: Payer: Self-pay | Admitting: Family Medicine

## 2023-11-28 NOTE — Telephone Encounter (Signed)
Per our discussion re letter pt left reporting that she does not know if her application for renewal for ozempic that was brought in on 11/01/2023, has been sent off, please speak with her directly to clarify the situation You report that you have personally faxed  off the letter , I recommend that you provide her with a copy of the completed form also If there are any other concerns pls let me kniow Please contact her today about this, thanks The letter that she brought in is at the Nurse's station on hold this is  sorted out fully

## 2023-11-28 NOTE — Telephone Encounter (Signed)
Patient came by the office and pick up the form. Should not have been faxed.

## 2023-12-13 NOTE — Telephone Encounter (Signed)
Waiting for scanned document that was abstracted by the practice to be available.

## 2023-12-20 ENCOUNTER — Encounter: Payer: Self-pay | Admitting: Family Medicine

## 2023-12-20 NOTE — Telephone Encounter (Signed)
 Care team updated and letter sent for eye exam notes.

## 2024-01-01 ENCOUNTER — Other Ambulatory Visit: Payer: Self-pay | Admitting: Family Medicine

## 2024-01-29 ENCOUNTER — Telehealth: Payer: Self-pay | Admitting: Family Medicine

## 2024-01-29 NOTE — Telephone Encounter (Signed)
Pt assistance meds received in office  Pt advised meds here for pick up

## 2024-02-06 ENCOUNTER — Ambulatory Visit (INDEPENDENT_AMBULATORY_CARE_PROVIDER_SITE_OTHER): Payer: Medicare HMO | Admitting: Family Medicine

## 2024-02-06 ENCOUNTER — Encounter: Payer: Self-pay | Admitting: Family Medicine

## 2024-02-06 VITALS — BP 120/80 | HR 82 | Ht 63.0 in | Wt 172.1 lb

## 2024-02-06 DIAGNOSIS — Z1231 Encounter for screening mammogram for malignant neoplasm of breast: Secondary | ICD-10-CM

## 2024-02-06 DIAGNOSIS — Z0001 Encounter for general adult medical examination with abnormal findings: Secondary | ICD-10-CM | POA: Diagnosis not present

## 2024-02-06 DIAGNOSIS — E559 Vitamin D deficiency, unspecified: Secondary | ICD-10-CM

## 2024-02-06 DIAGNOSIS — Z780 Asymptomatic menopausal state: Secondary | ICD-10-CM

## 2024-02-06 DIAGNOSIS — E785 Hyperlipidemia, unspecified: Secondary | ICD-10-CM | POA: Diagnosis not present

## 2024-02-06 DIAGNOSIS — I10 Essential (primary) hypertension: Secondary | ICD-10-CM

## 2024-02-06 DIAGNOSIS — E1169 Type 2 diabetes mellitus with other specified complication: Secondary | ICD-10-CM | POA: Diagnosis not present

## 2024-02-06 DIAGNOSIS — Z0000 Encounter for general adult medical examination without abnormal findings: Secondary | ICD-10-CM

## 2024-02-06 MED ORDER — KETOCONAZOLE 2 % EX CREA: 1.0000 | TOPICAL_CREAM | Freq: Every day | CUTANEOUS | 0 refills | Status: DC

## 2024-02-06 NOTE — Progress Notes (Signed)
Stacey Smith     MRN: [id number]      DOB: [birth year]  Chief Complaint  Patient presents with   Annual Exam    CPE    HPI: Patient is in for annual physical exam. No other health concerns are expressed or addressed at the visit. Recent labs,  are reviewed. Immunization is reviewed , and  updated if needed.   PE: BP 120/80   Pulse 82   Ht 5\' 3"  (1.6 m)   Wt 172 lb 1.3 oz (78.1 kg)   SpO2 98%   BMI 30.48 kg/m   Pleasant  female, alert and oriented x 3, in no cardio-pulmonary distress. Afebrile. HEENT No facial trauma or asymetry. Sinuses non tender.  Extra occullar muscles intact.. External ears normal, . Neck: supple, no adenopathy,JVD or thyromegaly.No bruits.  Chest: Clear to ascultation bilaterally.No crackles or wheezes. Non tender to palpation  Breast: Asdymptomatic, not examined, mammogram to be scheduled  Cardiovascular system; Heart sounds normal,  S1 and  S2 ,no S3.  No murmur, or thrill. Apical beat not displaced Peripheral pulses normal.  Abdomen: Soft, non tender, no organomegaly or masses. No bruits. Bowel sounds normal. No guarding, tenderness or rebound.    Musculoskeletal exam: Full ROM of spine, hips , shoulders and knees. No deformity ,swelling or crepitus noted. No muscle wasting or atrophy.   Neurologic: Cranial nerves 2 to 12 intact. Power, tone ,sensation and reflexes normal throughout. No disturbance in gait. No tremor.  Skin: Intact, no ulceration, , scaling rash noted on soles. Pigmentation normal throughout  Psych; Normal mood and affect. Judgement and concentration normal   Assessment & Plan:  Annual physical exam Annual exam as documented. Counseling done  re healthy lifestyle involving commitment to 150 minutes exercise per week, heart healthy diet, and attaining healthy weight.The importance of adequate sleep also discussed. Changes in health habits are decided on by the patient with goals and time frames   set for achieving them. Immunization and cancer screening needs are specifically addressed at this visit.   Type 2 diabetes mellitus with other specified complication (HCC) Diabetes associated with hypertension and hyperlipidemia  Stacey Smith is reminded of the importance of commitment to daily physical activity for 30 minutes or more, as able and the need to limit carbohydrate intake to 30 to 60 grams per meal to help with blood sugar control.   The need to take medication as prescribed, test blood sugar as directed, and to call between visits if there is a concern that blood sugar is uncontrolled is also discussed.   Stacey Smith is reminded of the importance of daily foot exam, annual eye examination, and good blood sugar, blood pressure and cholesterol control.     Latest Ref Rng & Units 02/08/2024   10:00 AM 09/04/2023   11:39 AM 04/27/2023    9:26 AM 01/24/2023   11:00 AM 09/04/2022   11:01 AM  Diabetic Labs  HbA1c 4.8 - 5.6 % 6.5  6.7  6.9  8.7  6.8   Micro/Creat Ratio 0 - 29 mg/g creat     5   Chol 100 - 199 mg/dL 409   811     HDL >91 mg/dL 53   43     Calc LDL 0 - 99 mg/dL 41   48     Triglycerides 0 - 149 mg/dL 478   295     Creatinine 0.57 - 1.00 mg/dL 6.21  3.08  6.57  8.46  0.86       02/06/2024    1:56 PM 02/06/2024    1:09 PM 10/15/2023   10:59 AM 09/10/2023    1:14 PM 09/04/2023   12:13 PM 09/04/2023   11:00 AM 09/04/2023   10:51 AM  BP/Weight  Systolic BP 120 137 -- 127 128 138 145  Diastolic BP 80 73 -- 77 72 52 82  Wt. (Lbs)  172.08 171 176.6   173.12  BMI  30.48 kg/m2 30.29 kg/m2 31.28 kg/m2   30.67 kg/m2      Latest Ref Rng & Units 10/25/2023   12:00 AM 12/08/2020   10:20 AM  Foot/eye exam completion dates  Eye Exam No Retinopathy No Retinopathy       Foot Form Completion   Done     This result is from an external source.

## 2024-02-06 NOTE — Patient Instructions (Addendum)
F/U 18 weeks, call if you need me sooner  Fasting lipid, cmp and  EGFR and HBA1C this week ( [facility] in [place])  Pls schedule mammogram due 8/8 or after at [facility]  Please schedule dexa at checkout  Urine ACR today past due  ( need order)  Good foot exam, important to examine feet daily to ensure no cuts or sores  Please follow through and get imaging study recommended by GI as we discussed   Ketoconazole cream is prescribed for use on soles of feet  It is important that you exercise regularly at least 30 minutes 5 times a week. If you develop chest pain, have severe difficulty breathing, or feel very tired, stop exercising immediately and seek medical attention   Think about what you will eat, plan ahead. Choose " clean, green, fresh or frozen" over canned, processed or packaged foods which are more sugary, salty and fatty. 70 to 75% of food eaten should be vegetables and fruit. Three meals at set times with snacks allowed between meals, but they must be fruit or vegetables. Aim to eat over a 12 hour period , example 7 am to 7 pm, and STOP after  your last meal of the day. Drink water,generally about 64 ounces per day, no other drink is as healthy. Fruit juice is best enjoyed in a healthy way, by EATING the fruit. 10 to 12  pound weight loss in next 12 months will be benficial to your overall health  Thanks for choosing [facility], we consider it a privelige to serve you.

## 2024-02-07 ENCOUNTER — Telehealth: Payer: Self-pay | Admitting: *Deleted

## 2024-02-07 NOTE — Telephone Encounter (Signed)
Pt left vm stating that she missed the MRI that was scheduled due to death in family. She needed to get that rescheduled.  Advised pt that MRI is rescheduled on Saturday 02/16/24, arrive at 8:15 am to check in and NPO 4 hours prior

## 2024-02-08 DIAGNOSIS — I10 Essential (primary) hypertension: Secondary | ICD-10-CM | POA: Diagnosis not present

## 2024-02-08 DIAGNOSIS — E785 Hyperlipidemia, unspecified: Secondary | ICD-10-CM | POA: Diagnosis not present

## 2024-02-08 DIAGNOSIS — E1169 Type 2 diabetes mellitus with other specified complication: Secondary | ICD-10-CM | POA: Diagnosis not present

## 2024-02-09 ENCOUNTER — Encounter: Payer: Self-pay | Admitting: Family Medicine

## 2024-02-09 LAB — CMP14+EGFR
ALT: 14 [IU]/L (ref 0–32)
AST: 16 [IU]/L (ref 0–40)
Albumin: 4.2 g/dL (ref 3.8–4.8)
Alkaline Phosphatase: 162 [IU]/L — ABNORMAL HIGH (ref 44–121)
BUN/Creatinine Ratio: 20 (ref 12–28)
BUN: 19 mg/dL (ref 8–27)
Bilirubin Total: 0.7 mg/dL (ref 0.0–1.2)
CO2: 23 mmol/L (ref 20–29)
Calcium: 9.7 mg/dL (ref 8.7–10.3)
Chloride: 103 mmol/L (ref 96–106)
Creatinine, Ser: 0.93 mg/dL (ref 0.57–1.00)
Globulin, Total: 2.6 g/dL (ref 1.5–4.5)
Glucose: 109 mg/dL — ABNORMAL HIGH (ref 70–99)
Potassium: 4.6 mmol/L (ref 3.5–5.2)
Sodium: 141 mmol/L (ref 134–144)
Total Protein: 6.8 g/dL (ref 6.0–8.5)
eGFR: 66 mL/min/{1.73_m2} (ref >59)

## 2024-02-09 LAB — LIPID PANEL
Chol/HDL Ratio: 2.1 {ratio} (ref 0.0–4.4)
Cholesterol, Total: 113 mg/dL (ref 100–199)
HDL: 53 mg/dL (ref >39)
LDL Chol Calc (NIH): 41 mg/dL (ref 0–99)
Triglycerides: 100 mg/dL (ref 0–149)
VLDL Cholesterol Cal: 19 mg/dL (ref 5–40)

## 2024-02-09 LAB — HEMOGLOBIN A1C
Est. average glucose Bld gHb Est-mCnc: 140 mg/dL
Hgb A1c MFr Bld: 6.5 % — ABNORMAL HIGH (ref 4.8–5.6)

## 2024-02-10 NOTE — Assessment & Plan Note (Signed)
Diabetes associated with hypertension and hyperlipidemia  Stacey Smith is reminded of the importance of commitment to daily physical activity for 30 minutes or more, as able and the need to limit carbohydrate intake to 30 to 60 grams per meal to help with blood sugar control.   The need to take medication as prescribed, test blood sugar as directed, and to call between visits if there is a concern that blood sugar is uncontrolled is also discussed.   Stacey Smith is reminded of the importance of daily foot exam, annual eye examination, and good blood sugar, blood pressure and cholesterol control.     Latest Ref Rng & Units 02/08/2024   10:00 AM 09/04/2023   11:39 AM 04/27/2023    9:26 AM 01/24/2023   11:00 AM 09/04/2022   11:01 AM  Diabetic Labs  HbA1c 4.8 - 5.6 % 6.5  6.7  6.9  8.7  6.8   Micro/Creat Ratio 0 - 29 mg/g creat     5   Chol 100 - 199 mg/dL 161   096     HDL >04 mg/dL 53   43     Calc LDL 0 - 99 mg/dL 41   48     Triglycerides 0 - 149 mg/dL 540   981     Creatinine 0.57 - 1.00 mg/dL 1.91  4.78  2.95  6.21  0.86       02/06/2024    1:56 PM 02/06/2024    1:09 PM 10/15/2023   10:59 AM 09/10/2023    1:14 PM 09/04/2023   12:13 PM 09/04/2023   11:00 AM 09/04/2023   10:51 AM  BP/Weight  Systolic BP 120 137 -- 127 128 138 145  Diastolic BP 80 73 -- 77 72 52 82  Wt. (Lbs)  172.08 171 176.6   173.12  BMI  30.48 kg/m2 30.29 kg/m2 31.28 kg/m2   30.67 kg/m2      Latest Ref Rng & Units 10/25/2023   12:00 AM 12/08/2020   10:20 AM  Foot/eye exam completion dates  Eye Exam No Retinopathy No Retinopathy       Foot Form Completion   Done     This result is from an external source.

## 2024-02-10 NOTE — Assessment & Plan Note (Signed)
Annual exam as documented. Counseling done  re healthy lifestyle involving commitment to 150 minutes exercise per week, heart healthy diet, and attaining healthy weight.The importance of adequate sleep also discussed. Changes in health habits are decided on by the patient with goals and time frames  set for achieving them. Immunization and cancer screening needs are specifically addressed at this visit. 

## 2024-02-12 ENCOUNTER — Other Ambulatory Visit: Payer: Self-pay | Admitting: Family Medicine

## 2024-02-12 MED ORDER — ONETOUCH DELICA PLUS LANCET30G MISC: 2 refills | Status: DC

## 2024-02-12 NOTE — Telephone Encounter (Signed)
Copied from CRM [id number]. Topic: Clinical - Medication Refill >> Feb 12, 2024  2:27 PM Clayton Bibles wrote: Most Recent Primary Care Visit:  Provider: Kerri Perches  Department: [facility]  Visit Type: PHYSICAL  Date: 02/06/2024  Medication: [facility] DELICA PLUS LANCET30G) Has the patient contacted their pharmacy? Yes (Agent: If no, request that the patient contact the pharmacy for the refill. If patient does not wish to contact the pharmacy document the reason why and proceed with request.) (Agent: If yes, when and what did the pharmacy advise?) Pharmacy needs order  Is this the correct pharmacy for this prescription? [facility] If no, delete pharmacy and type the correct one.  This is the patient's preferred pharmacy:  [id number] [place], [place] - [address] [facility] [address] [place] [address] Phone: [phone] Fax: [phone]   Has the prescription been filled recently? No  Is the patient out of the medication? No  Has the patient been seen for an appointment in the last year OR does the patient have an upcoming appointment? Yes  Can we respond through MyChart? No  Agent: Please be advised that Rx refills may take up to 3 business days. We ask that you follow-up with your pharmacy.

## 2024-02-14 ENCOUNTER — Other Ambulatory Visit: Payer: Self-pay

## 2024-02-14 MED ORDER — ONETOUCH DELICA PLUS LANCET30G MISC: 2 refills | Status: AC

## 2024-02-16 ENCOUNTER — Ambulatory Visit (HOSPITAL_COMMUNITY)
Admission: RE | Admit: 2024-02-16 | Discharge: 2024-02-16 | Disposition: A | Discharge status: Home / Self Care | Payer: Medicare HMO | Source: Ambulatory Visit | Attending: Gastroenterology | Admitting: Gastroenterology

## 2024-02-16 ENCOUNTER — Other Ambulatory Visit: Payer: Self-pay | Admitting: Gastroenterology

## 2024-02-16 DIAGNOSIS — K449 Diaphragmatic hernia without obstruction or gangrene: Secondary | ICD-10-CM | POA: Diagnosis not present

## 2024-02-16 DIAGNOSIS — K760 Fatty (change of) liver, not elsewhere classified: Secondary | ICD-10-CM | POA: Diagnosis not present

## 2024-02-16 DIAGNOSIS — R748 Abnormal levels of other serum enzymes: Secondary | ICD-10-CM | POA: Diagnosis not present

## 2024-02-16 DIAGNOSIS — K8020 Calculus of gallbladder without cholecystitis without obstruction: Secondary | ICD-10-CM | POA: Diagnosis not present

## 2024-02-16 MED ORDER — GADOBUTROL 1 MMOL/ML IV SOLN
7.5000 mL | Freq: Once | INTRAVENOUS | Status: AC | PRN
  Administered 2024-02-16: 7.5 mL via INTRAVENOUS

## 2024-02-27 ENCOUNTER — Other Ambulatory Visit: Payer: Self-pay | Admitting: *Deleted

## 2024-02-27 DIAGNOSIS — R748 Abnormal levels of other serum enzymes: Secondary | ICD-10-CM

## 2024-03-04 NOTE — Telephone Encounter (Signed)
 Pt needs a liver biopsy. She received a call to schedule it but she contacted her insurance and says a PA needs to be done.   Advised pt that PA was done online and No Authorization Required. Pt verbalized understanding. States she will call scheduler back to schedule liver biopsy  Availty PA: Procedure Code 1 16109  Quantity 1 Days  Procedure From - To Date 2024-03-05  Status NO AUTH REQUIRED  Message PRECERT IS NOT REQUIRED OR ONLY REQUIRED IN UNIQUE CIRCUMSTANCES FOR MEDICAID REFER TO PRIOR AUTH TOOL ON [email or web address] FOR ALL OTHERS REFER TO CODE SEARCH TOOL ON [email or web address] COVERAGE OF SERVICES ARE SUBJECT TO BENEFITS AND ELIGIBILITY

## 2024-03-19 ENCOUNTER — Other Ambulatory Visit: Payer: Self-pay | Admitting: Radiology

## 2024-03-19 DIAGNOSIS — R748 Abnormal levels of other serum enzymes: Secondary | ICD-10-CM

## 2024-03-20 ENCOUNTER — Ambulatory Visit (HOSPITAL_COMMUNITY)
Admission: RE | Admit: 2024-03-20 | Discharge: 2024-03-20 | Disposition: A | Discharge status: Home / Self Care | Source: Ambulatory Visit | Attending: Gastroenterology | Admitting: Gastroenterology

## 2024-03-20 ENCOUNTER — Encounter (HOSPITAL_COMMUNITY): Payer: Self-pay

## 2024-03-20 DIAGNOSIS — K8020 Calculus of gallbladder without cholecystitis without obstruction: Secondary | ICD-10-CM | POA: Diagnosis not present

## 2024-03-20 DIAGNOSIS — R748 Abnormal levels of other serum enzymes: Secondary | ICD-10-CM

## 2024-03-20 DIAGNOSIS — K760 Fatty (change of) liver, not elsewhere classified: Secondary | ICD-10-CM | POA: Insufficient documentation

## 2024-03-20 LAB — COMPREHENSIVE METABOLIC PANEL WITH GFR
ALT: 18 U/L (ref 0–44)
AST: 19 U/L (ref 15–41)
Albumin: 3.5 g/dL (ref 3.5–5.0)
Alkaline Phosphatase: 136 U/L — ABNORMAL HIGH (ref 38–126)
Anion gap: 10 (ref 5–15)
BUN: 16 mg/dL (ref 8–23)
CO2: 21 mmol/L — ABNORMAL LOW (ref 22–32)
Calcium: 9.2 mg/dL (ref 8.9–10.3)
Chloride: 108 mmol/L (ref 98–111)
Creatinine, Ser: 0.7 mg/dL (ref 0.44–1.00)
GFR, Estimated: >60 mL/min (ref >60)
Glucose, Bld: 104 mg/dL — ABNORMAL HIGH (ref 70–99)
Potassium: 3.8 mmol/L (ref 3.5–5.1)
Sodium: 139 mmol/L (ref 135–145)
Total Bilirubin: 0.7 mg/dL (ref 0.0–1.2)
Total Protein: 7.1 g/dL (ref 6.5–8.1)

## 2024-03-20 LAB — CBC WITH DIFFERENTIAL/PLATELET
Abs Immature Granulocytes: 0.03 10*3/uL (ref 0.00–0.07)
Basophils Absolute: 0.1 10*3/uL (ref 0.0–0.1)
Basophils Relative: 1 %
Eosinophils Absolute: 0.3 10*3/uL (ref 0.0–0.5)
Eosinophils Relative: 4 %
HCT: 35.6 % — ABNORMAL LOW (ref 36.0–46.0)
Hemoglobin: 10.9 g/dL — ABNORMAL LOW (ref 12.0–15.0)
Immature Granulocytes: 0 %
Lymphocytes Relative: 34 %
Lymphs Abs: 2.5 10*3/uL (ref 0.7–4.0)
MCH: 23.9 pg — ABNORMAL LOW (ref 26.0–34.0)
MCHC: 30.6 g/dL (ref 30.0–36.0)
MCV: 77.9 fL — ABNORMAL LOW (ref 80.0–100.0)
Monocytes Absolute: 0.6 10*3/uL (ref 0.1–1.0)
Monocytes Relative: 8 %
Neutro Abs: 4 10*3/uL (ref 1.7–7.7)
Neutrophils Relative %: 53 %
Platelets: 293 10*3/uL (ref 150–400)
RBC: 4.57 MIL/uL (ref 3.87–5.11)
RDW: 15.9 % — ABNORMAL HIGH (ref 11.5–15.5)
WBC: 7.5 10*3/uL (ref 4.0–10.5)
nRBC: 0 % (ref 0.0–0.2)

## 2024-03-20 LAB — PROTIME-INR
INR: 0.9 (ref 0.8–1.2)
Prothrombin Time: 12.4 s (ref 11.4–15.2)

## 2024-03-20 LAB — GLUCOSE, CAPILLARY: Glucose-Capillary: 104 mg/dL — ABNORMAL HIGH (ref 70–99)

## 2024-03-20 MED ORDER — FENTANYL CITRATE (PF) 100 MCG/2ML IJ SOLN
INTRAMUSCULAR | Status: AC
  Filled 2024-03-20: qty 2

## 2024-03-20 MED ORDER — LIDOCAINE HCL 1 % IJ SOLN
INTRAMUSCULAR | Status: AC
  Filled 2024-03-20: qty 20

## 2024-03-20 MED ORDER — LIDOCAINE HCL 1 % IJ SOLN
INTRAMUSCULAR | Status: AC | PRN
  Administered 2024-03-20: 10 mL via INTRADERMAL

## 2024-03-20 MED ORDER — SODIUM CHLORIDE 0.9 % IV SOLN: INTRAVENOUS | Status: DC

## 2024-03-20 MED ORDER — FENTANYL CITRATE (PF) 100 MCG/2ML IJ SOLN
INTRAMUSCULAR | Status: AC | PRN
  Administered 2024-03-20: 50 ug via INTRAVENOUS

## 2024-03-20 MED ORDER — GELATIN ABSORBABLE 12-7 MM EX MISC
CUTANEOUS | Status: AC | PRN
  Administered 2024-03-20: 1 via TOPICAL

## 2024-03-20 MED ORDER — MIDAZOLAM HCL 2 MG/2ML IJ SOLN
INTRAMUSCULAR | Status: AC | PRN
  Administered 2024-03-20: 1 mg via INTRAVENOUS

## 2024-03-20 MED ORDER — MIDAZOLAM HCL 2 MG/2ML IJ SOLN
INTRAMUSCULAR | Status: AC
  Filled 2024-03-20: qty 2

## 2024-03-20 MED ORDER — GELATIN ABSORBABLE 12-7 MM EX MISC
CUTANEOUS | Status: AC
  Filled 2024-03-20: qty 1

## 2024-03-20 MED ORDER — HYDROCODONE-ACETAMINOPHEN 5-325 MG PO TABS
1.0000 | ORAL_TABLET | ORAL | Status: DC | PRN
  Filled 2024-03-20: qty 2

## 2024-03-20 NOTE — Discharge Instructions (Signed)
Liver Biopsy, Care After  May remove dressing or bandaid and shower tomorrow.  Keep site clean and dry. Replace with clean dressing or bandaid as necessary.   Urgent needs - Interventional Radiology clinic [phone]  After a liver biopsy, it is common to have these things in the area where the biopsy was done. You may: Have pain. Feel sore. Have bruising. You may also feel tired for a few days. Follow these instructions at home: Medicines Take over-the-counter and prescription medicines only as told by your doctor. If you were prescribed an antibiotic medicine, take it as told by your doctor. Do not stop taking the antibiotic, even if you start to feel better. Do not take medicines that may thin your blood. These medicines include aspirin and ibuprofen. Take them only if your doctor tells you to. If told, take steps to prevent problems with pooping (constipation). You may need to: Drink enough fluid to keep your pee (urine) pale yellow. Take medicines. You will be told what medicines to take. Eat foods that are high in fiber. These include beans, whole grains, and fresh fruits and vegetables. Limit foods that are high in fat and sugar. These include fried or sweet foods. Ask your doctor if you should avoid driving or using machines while you are taking your medicine. Caring for your incision Follow instructions from your doctor about how to take care of your cut from surgery (incisions). Make sure you: Wash your hands with soap and water for at least 20 seconds before and after you change your bandage. If you cannot use soap and water, use hand sanitizer. Change your bandage. Leavestitches or skin glue in place for at least two weeks. Leave tape strips alone unless you are told to take them off. You may trim the edges of the tape strips if they curl up. Check your incision every day for signs of infection. Check for: Redness, swelling, or more pain. Fluid or blood. Warmth. Pus or a  bad smell. Do not take baths, swim, or use a hot tub. Ask your doctor about taking showers or sponge baths. Activity Rest at home for 1-2 days, or as told by your doctor. Get up to take short walks every 1 to 2 hours. Ask for help if you feel weak or unsteady. Do not lift anything that is heavier than 10 lb (4.5 kg), or the limit that you are told. Do not play contact sports for 2 weeks after the procedure. Return to your normal activities as told by your doctor. Ask what activities are safe for you. General instructions Do not drink alcohol in the first week after the procedure. Plan to have a responsible adult care for you for the time you are told after you leave the hospital or clinic. This is important. It is up to you to get the results of your procedure. Ask how to get your results when they are ready. Keep all follow-up visits.   Contact a doctor if: You have more bleeding in your incision. Your incision swells, or is red and more painful. You have fluid that comes from your incision. You develop a rash. You have fever or chills. Get help right away if: You have swelling, bloating, or pain in your belly (abdomen). You get dizzy or faint. You vomit or you feel like vomiting. You have trouble breathing or feel short of breath. You have chest pain. You have problems talking or seeing. You have trouble with your balance or moving your arms or   legs. These symptoms may be an emergency. Get help right away. Call your local emergency services (911 in the [place] Do not wait to see if the symptoms will go away. Do not drive yourself to the hospital. Summary After the procedure, it is common to have pain, soreness, bruising, and tiredness. Your doctor will tell you how to take care of yourself at home. Change your bandage, take your medicines, and limit your activities as told by your doctor. Call your doctor if you have symptoms of infection. Get help right away if your belly swells,  your cut bleeds a lot, or you have trouble talking or breathing. This information is not intended to replace advice given to you by your health care provider. Make sure you discuss any questions you have with your healthcare provider. Document Revised: 10/25/2020 Document Reviewed: 10/25/2020 Elsevier Patient Education  2022 Elsevier Inc.  Moderate Conscious Sedation, Adult, Care After This sheet gives you information about how to care for yourself after your procedure. Your health care provider may also give you more specific instructions. If you have problems or questions, contact your health careprovider. What can I expect after the procedure? After the procedure, it is common to have: Sleepiness for several hours. Impaired judgment for several hours. Difficulty with balance. Vomiting if you eat too soon. Follow these instructions at home: For the time period you were told by your health care provider: Rest. Do not participate in activities where you could fall or become injured. Do not drive or use machinery. Do not drink alcohol. Do not take sleeping pills or medicines that cause drowsiness. Do not make important decisions or sign legal documents. Do not take care of children on your own. Eating and drinking  Follow the diet recommended by your health care provider. Drink enough fluid to keep your urine pale yellow. If you vomit: Drink water, juice, or soup when you can drink without vomiting. Make sure you have little or no nausea before eating solid foods.  General instructions Take over-the-counter and prescription medicines only as told by your health care provider. Have a responsible adult stay with you for the time you are told. It is important to have someone help care for you until you are awake and alert. Do not smoke. Keep all follow-up visits as told by your health care provider. This is important. Contact a health care provider if: You are still sleepy or having  trouble with balance after 24 hours. You feel light-headed. You keep feeling nauseous or you keep vomiting. You develop a rash. You have a fever. You have redness or swelling around the IV site. Get help right away if: You have trouble breathing. You have new-onset confusion at home. Summary After the procedure, it is common to feel sleepy, have impaired judgment, or feel nauseous if you eat too soon. Rest after you get home. Know the things you should not do after the procedure. Follow the diet recommended by your health care provider and drink enough fluid to keep your urine pale yellow. Get help right away if you have trouble breathing or new-onset confusion at home. This information is not intended to replace advice given to you by your health care provider. Make sure you discuss any questions you have with your healthcare provider. Document Revised: 04/09/2020 Document Reviewed: 11/06/2019 Elsevier Patient Education  2022 Elsevier Inc.        

## 2024-03-20 NOTE — H&P (Addendum)
 Chief Complaint: Chronically elevated alkaline phosphatase level  Referring Provider(s): Ermalinda Memos, PA-C  Supervising Physician: Richarda Overlie  Patient Status: [facility] - Out-pt  History of Present Illness: Stacey Smith is a 73 y.o. female with history of hepatic steatosis, cholelithiasis, and chronically elevated alkaline phosphate presenting today for ultrasound guided random liver biopsy. Elevated alkaline phosphatase has been present since 2021. Pt had RUQ [place] in 12/2020 that showed cholelithiasis without cholecystitis and hepatic steatosis without focal lesion. Then had repeat RUQ [place] on 09/06/2023 that had same findings as prior. Then had MRI abdomen on 02/16/2024 that showed similar findings of hepatic steatosis, no biliary ductal dilation, and cholelithiasis without cholecystitis. Now here for ultrasound guided liver biopsy to rule out other etiology for chronically elevated alkaline phosphatase level. Today she is asymptomatic with no complaints.   Patient is Full Code  Past Medical History:  Diagnosis Date   Allergy    seasonal intermittently   Arthritis    spine   Back pain    Colon polyps    Diabetes mellitus without complication (HCC)    GERD (gastroesophageal reflux disease)    Headache(784.0)    Hyperlipidemia    well controlled   Obesity    S/P colonoscopy 09/2006   Normal rectum, diminutive polyps in the left colon cold: TUBULAR ADENOMA   TMJ (dislocation of temporomandibular joint)     Past Surgical History:  Procedure Laterality Date   ABDOMINAL HYSTERECTOMY N/A    Phreesia 03/04/2021   COLONOSCOPY  10/03/2011   Procedure: COLONOSCOPY;  Surgeon: Corbin Ade, MD;  Location: [facility];  Service: Endoscopy;  Laterality: N/A;  10:30   COLONOSCOPY N/A 11/01/2016   rourk: Diverticulosis, multiple colon polyps removed, tubular adenomas.  Next colonoscopy in November 2022.   COLONOSCOPY WITH PROPOFOL N/A 03/09/2022   Procedure: COLONOSCOPY WITH PROPOFOL;   Surgeon: Corbin Ade, MD;  Location: [facility];  Service: Endoscopy;  Laterality: N/A;  9:00 / ASA 2   CORONARY CT ANGIOGRAM  12/2018   Coronary calcium score of 0. This was 0 percentile for age & sex matched control. Normal coronary origin with right dominance. No evidence of CAD.    excision of cyst for thyroid gland non cancer  1988   POLYPECTOMY  11/01/2016   Procedure: POLYPECTOMY;  Surgeon: Corbin Ade, MD;  Location: [facility];  Service: Endoscopy;;  colon    POLYPECTOMY  03/09/2022   Procedure: POLYPECTOMY;  Surgeon: Corbin Ade, MD;  Location: [facility];  Service: Endoscopy;;   TMJ ARTHROPLASTY  09/23/2012   Procedure: TEMPOROMANDIBULAR JOINT (TMJ) ARTHROPLASTY;  Surgeon: Georgia Lopes, DDS;  Location: [facility] OR;  Service: Oral Surgery;  Laterality: Left;  Temporomandibular joint arthrotomy and meniscectomy   TONSILLECTOMY  1960   TOTAL ABDOMINAL HYSTERECTOMY  1985   right ovary fibroids    TUBAL LIGATION N/A    Phreesia 03/04/2021    Allergies: Metformin and related and Tessalon perles [benzonatate]  Medications: Prior to Admission medications   Medication Sig Start Date End Date Taking? Authorizing Provider  aspirin EC 81 MG tablet Take 1 tablet (81 mg total) by mouth daily. Swallow whole. 07/26/20   Kerri Perches, MD  atorvastatin (LIPITOR) 80 MG tablet TAKE 1 TABLET BY MOUTH EVERY DAY 04/05/23   Kerri Perches, MD  Cholecalciferol (VITAMIN D3) 50 MCG (2000 UT) TABS Take 2,000 Units by mouth daily.    [provider]  famotidine (PEPCID) 40 MG  tablet TAKE 1 TABLET BY MOUTH EVERY DAY AS NEEDED FOR HEARTBURN 01/01/24   Kerri Perches, MD  fluticasone [facility]) 50 MCG/ACT nasal spray Place 2 sprays into both nostrils daily as needed. For allergies 04/05/22   Kerri Perches, MD  ibuprofen (ADVIL) 800 MG tablet Take 1 tablet (800 mg total) by mouth every 8 (eight) hours as needed. 07/11/23   Kerri Perches, MD  ketoconazole  (NIZORAL) 2 % cream Apply 1 Application topically daily. Apply twice daily to soles of feet for 2 weeks, then as needed 02/06/24   Kerri Perches, MD  Lancets [facility] DELICA PLUS [place]) MISC USE AS DIRECTED 02/14/24   Kerri Perches, MD  olmesartan (BENICAR) 20 MG tablet Take 1 tablet (20 mg total) by mouth daily. 05/02/23   Kerri Perches, MD  [facility] VERIO test strip Use as instructed twice daily dx e11.65 05/03/23   Kerri Perches, MD  Semaglutide, 1 MG/DOSE, 4 MG/3ML SOPN Inject 1 mg as directed once a week. 10/02/23   Kerri Perches, MD     Family History  Problem Relation Age of Onset   Stroke Mother    Hypertension Mother    Cancer Mother        breast    Glaucoma Mother    Asthma Mother    Coronary artery disease Father    Diabetes Father    Heart failure Father    Kidney disease Father    Diabetes Sister    Hypertension Sister    Diabetes Sister    Depression Sister    Diabetes Sister    Depression Sister    Colon cancer Sister    Cancer - Colon Sister        colon    Cancer Sister    Thyroid disease Sister    Bipolar disorder Sister     Social History   Socioeconomic History   Marital status: Divorced    Spouse name: Not on file   Number of children: 2   Years of education: Not on file   Highest education level: 12th grade  Occupational History   Occupation: Engineer, manufacturing systems: [employer]    Comment: Armed forces operational officer  Tobacco Use   Smoking status: Never   Smokeless tobacco: Never  Vaping Use   Vaping status: Never Used  Substance and Sexual Activity   Alcohol use: Yes    Alcohol/week: 2.0 standard drinks of alcohol    Types: 2 Shots of liquor per week    Comment: Occasionally   Drug use: No   Sexual activity: Not Currently    Birth control/protection: None  Other Topics Concern   Not on file  Social History Narrative   Recently lost her long-term partner (2019)   Social Drivers of Corporate investment banker  Strain: Low Risk  (02/02/2024)   Overall Financial Resource Strain (CARDIA)    Difficulty of Paying Living Expenses: Not hard at all  Food Insecurity: No Food Insecurity (02/02/2024)   Hunger Vital Sign    Worried About Running Out of Food in the Last Year: Never true    Ran Out of Food in the Last Year: Never true  Transportation Needs: No Transportation Needs (02/02/2024)   PRAPARE - Administrator, Civil Service (Medical): No    Lack of Transportation (Non-Medical): No  Physical Activity: Insufficiently Active (02/02/2024)   Exercise Vital Sign    Days of Exercise per Week:  3 days    Minutes of Exercise per Session: 30 min  Stress: No Stress Concern Present (02/02/2024)   [employer] of Occupational Health - Occupational Stress Questionnaire    Feeling of Stress : Not at all  Social Connections: Moderately Integrated (02/02/2024)   Social Connection and Isolation Panel [NHANES]    Frequency of Communication with Friends and Family: More than three times a week    Frequency of Social Gatherings with Friends and Family: More than three times a week    Attends Religious Services: More than 4 times per year    Active Member of [employer] or Organizations: Yes    Attends Engineer, structural: More than 4 times per year    Marital Status: Divorced     Review of Systems: A 12 point ROS discussed and pertinent positives are indicated in the HPI above.  All other systems are negative.   Vital Signs: Pulse (!) 57   Temp (!) 97.5 F (36.4 C) (Oral)   Resp 18   SpO2 97%   Advance Care Plan: No documents on file  Physical Exam Vitals and nursing note reviewed.  Constitutional:      Appearance: She is not ill-appearing or toxic-appearing.  HENT:     Mouth/Throat:     Mouth: Mucous membranes are moist.     Pharynx: Oropharynx is clear.  Cardiovascular:     Rate and Rhythm: Normal rate and regular rhythm.  Pulmonary:     Effort: Pulmonary effort is normal.     Breath  sounds: Normal breath sounds.  Abdominal:     Palpations: Abdomen is soft.     Tenderness: There is no abdominal tenderness.  Musculoskeletal:     Right lower leg: No edema.     Left lower leg: No edema.  Skin:    General: Skin is warm and dry.  Neurological:     General: No focal deficit present.     Mental Status: She is alert and oriented to person, place, and time. Mental status is at baseline.     Imaging: No results found.  Labs:  CBC: Recent Labs    09/04/23 1139  WBC 7.1  HGB 11.1  HCT 36.4  PLT 311    COAGS: No results for input(s): "INR", "APTT" in the last 8760 hours.  BMP: Recent Labs    04/27/23 0926 09/04/23 1139 02/08/24 1000  NA 141 143 141  K 4.1 3.9 4.6  CL 103 103 103  CO2 22 26 23   GLUCOSE 93 94 109*  BUN 15 13 19   CALCIUM 9.6 9.4 9.7  CREATININE 0.86 0.95 0.93    LIVER FUNCTION TESTS: Recent Labs    04/27/23 0926 09/04/23 1139 09/11/23 0940 02/08/24 1000  BILITOT 0.8 0.9  --  0.7  AST 19 16  --  16  ALT 18 17  --  14  ALKPHOS 167* 184* 172* 162*  PROT 6.6 6.4  --  6.8  ALBUMIN 4.1 4.0  --  4.2    TUMOR MARKERS: No results for input(s): "AFPTM", "CEA", "CA199", "CHROMGRNA" in the last 8760 hours.  Assessment and Plan:  KATLEN SEYER is a 73 y.o. female with history of hepatic steatosis, cholelithiasis, and chronically elevated alkaline phosphate presenting today for ultrasound guided random liver biopsy. Elevated alkaline phosphatase has been present since 2021. Pt had RUQ [place] in 12/2020 that showed cholelithiasis without cholecystitis and hepatic steatosis without focal lesion. Had repeat RUQ [place] on 09/06/2023 that had  same findings as prior. Then had MRI abdomen on 02/16/2024 that showed similar findings of hepatic steatosis, no biliary ductal dilation, and cholelithiasis without cholecystitis. Now here for ultrasound guided liver biopsy to rule out other etiology for chronically elevated alkaline phosphatase level. Today she is  asymptomatic with no complaints.   Dr. Richarda Overlie reviewed case and agreeable to proceed.  Risks and benefits of ultrasound guided liver biopsy was discussed with the patient and/or patient's family including, but not limited to bleeding, infection, damage to adjacent structures or low yield requiring additional tests.  All of the questions were answered and there is agreement to proceed.  Consent signed and in chart.   Thank you for allowing our service to participate in MAZIYAH VESSEL 's care.  Electronically Signed: Katheren Puller, PA-C   03/20/2024, 1:01 PM      I spent a total of  30 Minutes   in face to face in clinical consultation, greater than 50% of which was counseling/coordinating care for ultrasound guided liver biopsy.

## 2024-03-20 NOTE — Procedures (Signed)
 Interventional Radiology Procedure:   Indications: Elevated alkaline phosphatase level  Procedure: US guided liver biopsy   Findings: 3 core biopsies from right hepatic lobe.  Gelfoam slurry injected along biopsy tract.   Complications: None     EBL: Minimal  Plan: Bedrest 3 hours, then discharge to home.  Sharief Wainwright R. Lowella Dandy, MD  Pager: [phone]

## 2024-03-26 LAB — SURGICAL PATHOLOGY

## 2024-04-02 ENCOUNTER — Other Ambulatory Visit: Payer: Self-pay | Admitting: Family Medicine

## 2024-04-25 ENCOUNTER — Other Ambulatory Visit: Payer: Self-pay | Admitting: Family Medicine

## 2024-05-30 DIAGNOSIS — E1169 Type 2 diabetes mellitus with other specified complication: Secondary | ICD-10-CM | POA: Diagnosis not present

## 2024-06-01 LAB — MICROALBUMIN / CREATININE URINE RATIO
Creatinine, Urine: 135.8 mg/dL
Microalb/Creat Ratio: 5 mg/g{creat} (ref 0–29)
Microalbumin, Urine: 6.3 ug/mL

## 2024-06-04 ENCOUNTER — Ambulatory Visit: Payer: Medicare HMO | Admitting: Family Medicine

## 2024-06-04 ENCOUNTER — Encounter: Payer: Self-pay | Admitting: Family Medicine

## 2024-06-04 VITALS — BP 110/70 | HR 92 | Resp 16 | Ht 63.0 in | Wt 174.0 lb

## 2024-06-04 DIAGNOSIS — E559 Vitamin D deficiency, unspecified: Secondary | ICD-10-CM | POA: Diagnosis not present

## 2024-06-04 DIAGNOSIS — D649 Anemia, unspecified: Secondary | ICD-10-CM | POA: Diagnosis not present

## 2024-06-04 DIAGNOSIS — E785 Hyperlipidemia, unspecified: Secondary | ICD-10-CM | POA: Diagnosis not present

## 2024-06-04 DIAGNOSIS — E66811 Obesity, class 1: Secondary | ICD-10-CM

## 2024-06-04 DIAGNOSIS — I10 Essential (primary) hypertension: Secondary | ICD-10-CM | POA: Diagnosis not present

## 2024-06-04 DIAGNOSIS — E1169 Type 2 diabetes mellitus with other specified complication: Secondary | ICD-10-CM | POA: Diagnosis not present

## 2024-06-04 DIAGNOSIS — R7989 Other specified abnormal findings of blood chemistry: Secondary | ICD-10-CM | POA: Diagnosis not present

## 2024-06-04 MED ORDER — FLUTICASONE PROPIONATE 50 MCG/ACT NA SUSP: 2.0000 | Freq: Every day | NASAL | 1 refills | Status: AC | PRN

## 2024-06-04 MED ORDER — FAMOTIDINE 40 MG PO TABS: ORAL_TABLET | ORAL | 1 refills | Status: DC

## 2024-06-04 NOTE — Patient Instructions (Signed)
 F/Un in mid October call iof you need me sooner  Labs today, CBC, bmp and EGFR, HBA1C  Fasting lipid, cmp and EGFR, hBA1C, TSH and vit D 5 to 7 days before October apointment  Be safe!  Keep and continue to deveop good health habits!  Thanks for choosing [facility], we consider it a privelige to serve you.

## 2024-06-04 NOTE — Assessment & Plan Note (Signed)
 Hyperlipidemia:Low fat diet discussed and encouraged.   Lipid Panel  Lab Results  Component Value Date   CHOL 113 02/08/2024   HDL 53 02/08/2024   LDLCALC 41 02/08/2024   TRIG 100 02/08/2024   CHOLHDL 2.1 02/08/2024     Updated lab needed at/ before next visit.

## 2024-06-04 NOTE — Assessment & Plan Note (Signed)
 Diabetes associated with hypertension, hyperlipidemia, and arthritis  Ms. Calise is reminded of the importance of commitment to daily physical activity for 30 minutes or more, as able and the need to limit carbohydrate intake to 30 to 60 grams per meal to help with blood sugar control.   The need to take medication as prescribed, test blood sugar as directed, and to call between visits if there is a concern that blood sugar is uncontrolled is also discussed.   Ms. Stenner is reminded of the importance of daily foot exam, annual eye examination, and good blood sugar, blood pressure and cholesterol control.     Latest Ref Rng & Units 05/30/2024    8:47 AM 03/20/2024   12:45 PM 02/08/2024   10:00 AM 09/04/2023   11:39 AM 04/27/2023    9:26 AM  Diabetic Labs  HbA1c 4.8 - 5.6 %   6.5  6.7  6.9   Micro/Creat Ratio 0 - 29 mg/g creat 5       Chol 100 - 199 mg/dL   191   478   HDL >29 mg/dL   53   43   Calc LDL 0 - 99 mg/dL   41   48   Triglycerides 0 - 149 mg/dL   562   130   Creatinine 0.44 - 1.00 mg/dL  8.65  7.84  6.96  2.95       06/04/2024    1:29 PM 06/04/2024    1:10 PM 03/20/2024    4:20 PM 03/20/2024    3:50 PM 03/20/2024    3:35 PM 03/20/2024    3:20 PM 03/20/2024    3:05 PM  BP/Weight  Systolic BP 110 106 121 112 100 121 126  Diastolic BP 70 75 80 70 55 64 64  Wt. (Lbs)  174       BMI  30.82 kg/m2           Latest Ref Rng & Units 10/25/2023   12:00 AM 12/08/2020   10:20 AM  Foot/eye exam completion dates  Eye Exam No Retinopathy No Retinopathy       Foot Form Completion   Done     This result is from an external source.     Updated lab neededt.

## 2024-06-05 ENCOUNTER — Ambulatory Visit: Payer: Self-pay | Admitting: Family Medicine

## 2024-06-05 LAB — CBC
Hematocrit: 40.2 % (ref 34.0–46.6)
Hemoglobin: 12.1 g/dL (ref 11.1–15.9)
MCH: 23.5 pg — ABNORMAL LOW (ref 26.6–33.0)
MCHC: 30.1 g/dL — ABNORMAL LOW (ref 31.5–35.7)
MCV: 78 fL — ABNORMAL LOW (ref 79–97)
Platelets: 332 10*3/uL (ref 150–450)
RBC: 5.15 x10E6/uL (ref 3.77–5.28)
RDW: 14.6 % (ref 11.7–15.4)
WBC: 10.1 10*3/uL (ref 3.4–10.8)

## 2024-06-05 LAB — BMP8+EGFR
BUN/Creatinine Ratio: 15 (ref 12–28)
BUN: 14 mg/dL (ref 8–27)
CO2: 22 mmol/L (ref 20–29)
Calcium: 10.1 mg/dL (ref 8.7–10.3)
Chloride: 101 mmol/L (ref 96–106)
Creatinine, Ser: 0.94 mg/dL (ref 0.57–1.00)
Glucose: 98 mg/dL (ref 70–99)
Potassium: 4.3 mmol/L (ref 3.5–5.2)
Sodium: 139 mmol/L (ref 134–144)
eGFR: 65 mL/min/{1.73_m2} (ref >59)

## 2024-06-05 LAB — IRON,TIBC AND FERRITIN PANEL
Ferritin: 248 ng/mL — ABNORMAL HIGH (ref 15–150)
Iron Saturation: 24 % (ref 15–55)
Iron: 77 ug/dL (ref 27–139)
Total Iron Binding Capacity: 315 ug/dL (ref 250–450)
UIBC: 238 ug/dL (ref 118–369)

## 2024-06-05 LAB — HEMOGLOBIN A1C
Est. average glucose Bld gHb Est-mCnc: 137 mg/dL
Hgb A1c MFr Bld: 6.4 % — ABNORMAL HIGH (ref 4.8–5.6)

## 2024-06-14 NOTE — Progress Notes (Unsigned)
 Referring Provider: Antonetta Rollene BRAVO, MD Primary Care Physician:  Antonetta Rollene BRAVO, MD Primary GI Physician: Dr. Shaaron  Chief Complaint  Patient presents with   Follow-up    Doing well, no issues    HPI:   Stacey Smith is a 73 y.o. female presenting today for follow-up of elevated alk phos.   We have been following patient for elevated alkaline phosphatase.Prior evaluation in October 2021 with mitochondrial antibodies negative, GGT normal, alk phos isoenzymes upper limit normal for liver fraction (85%), normal bone and intestinal fraction.    RUQ ultrasound January 2022 with cholelithiasis without cholecystitis, hepatic steatosis, no focal hepatic lesion.  RUQ ultrasound 09/06/2023 with cholelithiasis, normal CBD, hepatic steatosis.   Last seen in the office 09/10/2023.  No concerning GI symptoms.  She had lost about 20 pounds with the help of semaglutide  that had been started about 3 months prior.  Recommended updating labs, alk phos isoenzymes, ANA, ASMA, ANA, MRI/MRCP.  Consider liver biopsy if labs and imaging unrevealing.  Alk phos isoenzymes showed alk phos 172, liver fraction 76%, bone fraction 12%, intestinal fraction 12 percent. None of these percentages were outside normal range.  ANA, ASMA, AMA, GGT all normal.  MRI/MRCP 02/16/2024 with hepatic steatosis, cholelithiasis without cholecystitis, no biliary duct dilation.   Liver biopsy in March 2025 with mild nonspecific changes including mild steatosis, scant portal inflammation, ceroid laden macrophages.  Steatosis comprise less than 5 to 10% of parenchymal volume.  Overall, changes mild and nonspecific.   Today:  Most recent CMP 03/20/2024 with alk phos 136, other LFTs with normal limits.  Reports she is doing great.  No GI concerns.  Denies abdominal pain, nausea, vomiting, reflux symptoms on Pepcid  40 mg daily, dysphagia, constipation, diarrhea, BRBPR, melena.   No known heart conditions.  No CKD.  No family  history of liver problems, autoimmune conditions.   Atorvastatin  started in the last 1-2 years and elevated alk phos predates this.  Not sure of her blood type.  She does have diabetes.     Colonoscopy 03/09/2022: Pancolonic diverticulosis, two 3-6 mm polyps in the descending colon and hepatic flexure resected and retrieved.  Pathology with tubular adenomas.  Recommended 5-year surveillance.    Past Medical History:  Diagnosis Date   Allergy    seasonal intermittently   Anemia 06/04/2024   Arthritis    spine   Back pain    Colon polyps    Diabetes mellitus without complication (HCC)    GERD (gastroesophageal reflux disease)    Headache(784.0)    Hyperlipidemia    well controlled   Obesity    S/P colonoscopy 09/2006   Normal rectum, diminutive polyps in the left colon cold: TUBULAR ADENOMA   TMJ (dislocation of temporomandibular joint)     Past Surgical History:  Procedure Laterality Date   ABDOMINAL HYSTERECTOMY N/A    Phreesia 03/04/2021   COLONOSCOPY  10/03/2011   Procedure: COLONOSCOPY;  Surgeon: Lamar CHRISTELLA Shaaron, MD;  Location: [facility];  Service: Endoscopy;  Laterality: N/A;  10:30   COLONOSCOPY N/A 11/01/2016   rourk: Diverticulosis, multiple colon polyps removed, tubular adenomas.  Next colonoscopy in November 2022.   COLONOSCOPY WITH PROPOFOL  N/A 03/09/2022   Procedure: COLONOSCOPY WITH PROPOFOL ;  Surgeon: Shaaron Lamar CHRISTELLA, MD;  Location: [facility];  Service: Endoscopy;  Laterality: N/A;  9:00 / ASA 2   CORONARY CT ANGIOGRAM  12/2018   Coronary calcium  score of 0. This was 0 percentile for age &  sex matched control. Normal coronary origin with right dominance. No evidence of CAD.    excision of cyst for thyroid  gland non cancer  1988   POLYPECTOMY  11/01/2016   Procedure: POLYPECTOMY;  Surgeon: Lamar CHRISTELLA Hollingshead, MD;  Location: [facility];  Service: Endoscopy;;  colon    POLYPECTOMY  03/09/2022   Procedure: POLYPECTOMY;  Surgeon: Hollingshead Lamar CHRISTELLA, MD;   Location: [facility];  Service: Endoscopy;;   TMJ ARTHROPLASTY  09/23/2012   Procedure: TEMPOROMANDIBULAR JOINT (TMJ) ARTHROPLASTY;  Surgeon: Glendia CHRISTELLA Primrose, DDS;  Location: [facility] OR;  Service: Oral Surgery;  Laterality: Left;  Temporomandibular joint arthrotomy and meniscectomy   TONSILLECTOMY  1960   TOTAL ABDOMINAL HYSTERECTOMY  1985   right ovary fibroids    TUBAL LIGATION N/A    Phreesia 03/04/2021    Current Outpatient Medications  Medication Sig Dispense Refill   aspirin EC 81 MG tablet Take 1 tablet (81 mg total) by mouth daily. Swallow whole. 30 tablet 11   atorvastatin  (LIPITOR) 80 MG tablet TAKE 1 TABLET BY MOUTH EVERY DAY 90 tablet 3   Cholecalciferol (VITAMIN D3) 50 MCG (2000 UT) TABS Take 2,000 Units by mouth daily.     famotidine  (PEPCID ) 40 MG tablet TAKE 1 TABLET BY MOUTH EVERY DAY AS NEEDED FOR HEARTBURN 90 tablet 1   fluticasone  (FLONASE ) 50 MCG/ACT nasal spray Place 2 sprays into both nostrils daily as needed. For allergies 48 mL 1   ibuprofen  (ADVIL ) 800 MG tablet Take 1 tablet (800 mg total) by mouth every 8 (eight) hours as needed. 30 tablet 1   Lancets (ONETOUCH DELICA PLUS LANCET30G) MISC USE AS DIRECTED 100 each 2   olmesartan  (BENICAR ) 20 MG tablet TAKE 1 TABLET BY MOUTH EVERY DAY 90 tablet 3   ONETOUCH VERIO test strip Use as instructed twice daily dx e11.65 200 strip 5   Semaglutide , 1 MG/DOSE, 4 MG/3ML SOPN Inject 1 mg as directed once a week. 3 mL 3   No current facility-administered medications for this visit.    Allergies as of 06/16/2024 - Review Complete 06/16/2024  Allergen Reaction Noted   Metformin  and related Other (See Comments) 11/03/2014   Tessalon  perles [benzonatate ] Dermatitis 07/12/2015    Family History  Problem Relation Age of Onset   Stroke Mother    Hypertension Mother    Cancer Mother        breast    Glaucoma Mother    Asthma Mother    Coronary artery disease Father    Diabetes Father    Heart failure Father    Kidney  disease Father    Diabetes Sister    Hypertension Sister    Diabetes Sister    Depression Sister    Diabetes Sister    Depression Sister    Colon cancer Sister    Cancer - Colon Sister        colon    Cancer Sister    Thyroid  disease Sister    Bipolar disorder Sister     Social History   Socioeconomic History   Marital status: Divorced    Spouse name: Not on file   Number of children: 2   Years of education: Not on file   Highest education level: 12th grade  Occupational History   Occupation: Engineer, manufacturing systems: [employer]    Comment: Armed forces operational officer  Tobacco Use   Smoking status: Never   Smokeless tobacco: Never  Vaping Use   Vaping  status: Never Used  Substance and Sexual Activity   Alcohol use: Yes    Alcohol/week: 2.0 standard drinks of alcohol    Types: 2 Shots of liquor per week    Comment: Occasionally   Drug use: No   Sexual activity: Not Currently    Birth control/protection: None  Other Topics Concern   Not on file  Social History Narrative   Recently lost her long-term partner (2019)   Social Drivers of Corporate investment banker Strain: Low Risk  (02/02/2024)   Overall Financial Resource Strain (CARDIA)    Difficulty of Paying Living Expenses: Not hard at all  Food Insecurity: No Food Insecurity (02/02/2024)   Hunger Vital Sign    Worried About Running Out of Food in the Last Year: Never true    Ran Out of Food in the Last Year: Never true  Transportation Needs: No Transportation Needs (02/02/2024)   PRAPARE - Administrator, Civil Service (Medical): No    Lack of Transportation (Non-Medical): No  Physical Activity: Insufficiently Active (02/02/2024)   Exercise Vital Sign    Days of Exercise per Week: 3 days    Minutes of Exercise per Session: 30 min  Stress: No Stress Concern Present (02/02/2024)   [employer] of Occupational Health - Occupational Stress Questionnaire    Feeling of Stress : Not at all  Social  Connections: Moderately Integrated (02/02/2024)   Social Connection and Isolation Panel    Frequency of Communication with Friends and Family: More than three times a week    Frequency of Social Gatherings with Friends and Family: More than three times a week    Attends Religious Services: More than 4 times per year    Active Member of [employer] or Organizations: Yes    Attends Engineer, structural: More than 4 times per year    Marital Status: Divorced    Review of Systems: Gen: Denies fever, chills, cold or flulike symptoms, presyncope, syncope. CV: Denies chest pain, palpitations.   Resp: Denies dyspnea, cough. GI: See HPI Heme: See HPI  Physical Exam: BP 132/74 (BP Location: Right Arm, Patient Position: Sitting, Cuff Size: Normal)   Pulse 82   Temp 98 F (36.7 C) (Oral)   Ht 5' 3 (1.6 m)   Wt 180 lb 3.2 oz (81.7 kg)   SpO2 95%   BMI 31.92 kg/m  General:   Alert and oriented. No distress noted. Pleasant and cooperative.  Head:  Normocephalic and atraumatic. Eyes:  Conjuctiva clear without scleral icterus. Abdomen:  +BS, soft, non-tender and non-distended. No rebound or guarding. No HSM or masses noted. Msk:  Symmetrical without gross deformities. Normal posture. Extremities:  Without edema. Neurologic:  Alert and  oriented x4 Psych:  Normal mood and affect.    Assessment:  73 year old female presenting today for follow-up of elevated alkaline phosphatase which dates back at least to 2021.  She has had extensive GI evaluation including multiple labs which have been unrevealing, imaging including RUQ ultrasound and MRI which was only shown hepatic steatosis, and liver biopsy in March 2025 showing mild nonspecific changes including mild steatosis, scant portal inflammation, ceroid laden macrophages.  Overall, changes were mild and nonspecific.  At this point, suspect mildly elevated alkaline phosphatase is multifactorial related to mild fatty liver disease, diabetes,  possibly advancing age.  On her alk phos isoenzymes, they have all been within normal limits.  Thyroid  function within normal limits in 2023.  She does not have any  history of CHF, CKD.  At this point, I do not recommend any additional workup from GI standpoint.  She can follow-up with her PCP to discuss any additional workup as needed.  Regarding fatty liver disease, she was counseled on the importance of weight loss or diet and exercise and tight glycemic control.  Most recent hemoglobin A1c was 6.4 on 06/04/2024.  Plan:  Instructions for fatty liver: Recommend 1-2# weight loss per week until ideal body weight through exercise & diet. Low fat/cholesterol diet.   Avoid sweets, sodas, fruit juices, sweetened beverages like tea, etc. Gradually increase exercise from 15 min daily up to 1 hr per day 5 days/week. Avoid alcohol use. Can follow-up with PCP to discuss role of any additional workup of elevated alk phos related to non-liver etiology.  Follow-up in 6 months or sooner if needed.    Josette Centers, PA-C [facility] 06/16/2024

## 2024-06-16 ENCOUNTER — Encounter: Payer: Self-pay | Admitting: Gastroenterology

## 2024-06-16 ENCOUNTER — Ambulatory Visit: Admitting: Gastroenterology

## 2024-06-16 VITALS — BP 132/74 | HR 82 | Temp 98.0°F | Ht 63.0 in | Wt 180.2 lb

## 2024-06-16 DIAGNOSIS — R748 Abnormal levels of other serum enzymes: Secondary | ICD-10-CM | POA: Diagnosis not present

## 2024-06-16 DIAGNOSIS — K760 Fatty (change of) liver, not elsewhere classified: Secondary | ICD-10-CM | POA: Diagnosis not present

## 2024-06-16 NOTE — Patient Instructions (Signed)
 Elevation of alkaline phosphatase may be from a combination of different factors including mild fatty liver disease, diabetes, advancing age.  At this point, we have not found any significant liver abnormality including on your liver biopsy.  Will continue to monitor your labs every 6 months.  It is possible that other outside factors aside from your liver could be contributing to elevated alkaline phosphatase.  You can discuss this with your primary care doctor.  Instructions for fatty liver: Recommend 1-2# weight loss per week until ideal body weight through exercise & diet. Low fat/cholesterol diet.   Avoid sweets, sodas, fruit juices, sweetened beverages like tea, etc. Gradually increase exercise from 15 min daily up to 1 hr per day 5 days/week. Avoid alcohol use.  I will see you back in 6 months or sooner if needed.   Josette Centers, PA-C [facility]

## 2024-06-24 ENCOUNTER — Encounter: Payer: Self-pay | Admitting: Family Medicine

## 2024-06-24 DIAGNOSIS — R7989 Other specified abnormal findings of blood chemistry: Secondary | ICD-10-CM | POA: Insufficient documentation

## 2024-06-24 NOTE — Assessment & Plan Note (Signed)
 Controlled, no change in medication DASH diet and commitment to daily physical activity for a minimum of 30 minutes discussed and encouraged, as a part of hypertension management. The importance of attaining a healthy weight is also discussed.     06/16/2024   10:37 AM 06/04/2024    1:29 PM 06/04/2024    1:10 PM 03/20/2024    4:20 PM 03/20/2024    3:50 PM 03/20/2024    3:35 PM 03/20/2024    3:20 PM  BP/Weight  Systolic BP 132 110 106 121 112 100 121  Diastolic BP 74 70 75 80 70 55 64  Wt. (Lbs) 180.2  174      BMI 31.92 kg/m2  30.82 kg/m2

## 2024-06-24 NOTE — Assessment & Plan Note (Signed)
 asymptomatic

## 2024-06-24 NOTE — Assessment & Plan Note (Signed)
 Updated lab needed at/ before next visit.

## 2024-06-24 NOTE — Progress Notes (Signed)
 Stacey Smith     MRN: [id number]      DOB: January 30, 1951  Chief Complaint  Patient presents with   Hypertension    Follow up visit     HPI Stacey Smith is here for follow up and re-evaluation of chronic medical conditions, medication management and review of any available recent lab and radiology data.  Preventive health is updated, specifically  Cancer screening and Immunization.   Questions or concerns regarding consultations or procedures which the PT has had in the interim are  addressed. The PT denies any adverse reactions to current medications since the last visit.  There are no new concerns.  There are no specific complaints   ROS Denies recent fever or chills. Denies sinus pressure, nasal congestion, ear pain or sore throat. Denies chest congestion, productive cough or wheezing. Denies chest pains, palpitations and leg swelling Denies abdominal pain, nausea, vomiting,diarrhea or constipation.   Denies dysuria, frequency, hesitancy or incontinence. Denies joint pain, swelling and limitation in mobility. Denies headaches, seizures, numbness, or tingling. Denies depression, anxiety or insomnia. Denies skin break down or rash.   PE  BP 110/70   Pulse 92   Resp 16   Ht 5' 3 (1.6 m)   Wt 174 lb (78.9 kg)   SpO2 96%   BMI 30.82 kg/m   Patient alert and oriented and in no cardiopulmonary distress.  HEENT: No facial asymmetry, EOMI,     Neck supple .  Chest: Clear to auscultation bilaterally.  CVS: S1, S2 no murmurs, no S3.Regular rate.  ABD: Soft non tender.   Ext: No edema  MS: Adequate ROM spine, shoulders, hips and knees.  Skin: Intact, no ulcerations or rash noted.  Psych: Good eye contact, normal affect. Memory intact not anxious or depressed appearing.  CNS: CN 2-12 intact, power,  normal throughout.no focal deficits noted.   Assessment & Plan  Type 2 diabetes mellitus with other specified complication (HCC) Diabetes associated with hypertension,  hyperlipidemia, and arthritis  Stacey Smith is reminded of the importance of commitment to daily physical activity for 30 minutes or more, as able and the need to limit carbohydrate intake to 30 to 60 grams per meal to help with blood sugar control.   The need to take medication as prescribed, test blood sugar as directed, and to call between visits if there is a concern that blood sugar is uncontrolled is also discussed.   Stacey Smith is reminded of the importance of daily foot exam, annual eye examination, and good blood sugar, blood pressure and cholesterol control.     Latest Ref Rng & Units 05/30/2024    8:47 AM 03/20/2024   12:45 PM 02/08/2024   10:00 AM 09/04/2023   11:39 AM 04/27/2023    9:26 AM  Diabetic Labs  HbA1c 4.8 - 5.6 %   6.5  6.7  6.9   Micro/Creat Ratio 0 - 29 mg/g creat 5       Chol 100 - 199 mg/dL   886   885   HDL >60 mg/dL   53   43   Calc LDL 0 - 99 mg/dL   41   48   Triglycerides 0 - 149 mg/dL   899   871   Creatinine 0.44 - 1.00 mg/dL  9.29  9.06  9.04  9.13       06/04/2024    1:29 PM 06/04/2024    1:10 PM 03/20/2024    4:20 PM 03/20/2024  3:50 PM 03/20/2024    3:35 PM 03/20/2024    3:20 PM 03/20/2024    3:05 PM  BP/Weight  Systolic BP 110 106 121 112 100 121 126  Diastolic BP 70 75 80 70 55 64 64  Wt. (Lbs)  174       BMI  30.82 kg/m2           Latest Ref Rng & Units 10/25/2023   12:00 AM 12/08/2020   10:20 AM  Foot/eye exam completion dates  Eye Exam No Retinopathy No Retinopathy       Foot Form Completion   Done     This result is from an external source.     Updated lab neededt.    Hyperlipidemia associated with type 2 diabetes mellitus (HCC) Hyperlipidemia:Low fat diet discussed and encouraged.   Lipid Panel  Lab Results  Component Value Date   CHOL 113 02/08/2024   HDL 53 02/08/2024   LDLCALC 41 02/08/2024   TRIG 100 02/08/2024   CHOLHDL 2.1 02/08/2024     Updated lab needed at/ before next visit.   Essential  hypertension Controlled, no change in medication DASH diet and commitment to daily physical activity for a minimum of 30 minutes discussed and encouraged, as a part of hypertension management. The importance of attaining a healthy weight is also discussed.     06/16/2024   10:37 AM 06/04/2024    1:29 PM 06/04/2024    1:10 PM 03/20/2024    4:20 PM 03/20/2024    3:50 PM 03/20/2024    3:35 PM 03/20/2024    3:20 PM  BP/Weight  Systolic BP 132 110 106 121 112 100 121  Diastolic BP 74 70 75 80 70 55 64  Wt. (Lbs) 180.2  174      BMI 31.92 kg/m2  30.82 kg/m2           Obesity (BMI 30.0-34.9)  Patient re-educated about  the importance of commitment to a  minimum of 150 minutes of exercise per week as able.  The importance of healthy food choices with portion control discussed, as well as eating regularly and within a 12 hour window most days. The need to choose clean , green food 50 to 75% of the time is discussed, as well as to make water  the primary drink and set a goal of 64 ounces water  daily.       06/16/2024   10:37 AM 06/04/2024    1:10 PM 02/06/2024    1:09 PM  Weight /BMI  Weight 180 lb 3.2 oz 174 lb 172 lb 1.3 oz  Height 5' 3 (1.6 m) 5' 3 (1.6 m) 5' 3 (1.6 m)  BMI 31.92 kg/m2 30.82 kg/m2 30.48 kg/m2    unchanged  Vitamin D  deficiency Updated lab needed at/ before next visit.   Cholelithiasis asymptomatic  Low vitamin D  level Updated lab needed at/ before next visit.

## 2024-06-24 NOTE — Assessment & Plan Note (Signed)
  Patient re-educated about  the importance of commitment to a  minimum of 150 minutes of exercise per week as able.  The importance of healthy food choices with portion control discussed, as well as eating regularly and within a 12 hour window most days. The need to choose clean , green food 50 to 75% of the time is discussed, as well as to make water  the primary drink and set a goal of 64 ounces water  daily.       06/16/2024   10:37 AM 06/04/2024    1:10 PM 02/06/2024    1:09 PM  Weight /BMI  Weight 180 lb 3.2 oz 174 lb 172 lb 1.3 oz  Height 5' 3 (1.6 m) 5' 3 (1.6 m) 5' 3 (1.6 m)  BMI 31.92 kg/m2 30.82 kg/m2 30.48 kg/m2    unchanged

## 2024-07-01 ENCOUNTER — Other Ambulatory Visit (HOSPITAL_COMMUNITY): Payer: Self-pay

## 2024-07-01 ENCOUNTER — Telehealth: Payer: Self-pay

## 2024-07-01 NOTE — Telephone Encounter (Signed)
 Copied from CRM [id number]. Topic: Clinical - Prescription Issue >> Jun 30, 2024  4:28 PM Sasha H wrote: Reason for CRM: Pt states that Semaglutide , 1 MG/DOSE, 4 MG/3ML SOPN is supposed to be shipped to the office and it has been two weeks and she has not heard back.

## 2024-07-02 ENCOUNTER — Other Ambulatory Visit (HOSPITAL_COMMUNITY): Payer: Self-pay

## 2024-07-03 ENCOUNTER — Telehealth: Payer: Self-pay

## 2024-07-03 ENCOUNTER — Other Ambulatory Visit (HOSPITAL_COMMUNITY): Payer: Self-pay

## 2024-07-03 NOTE — Progress Notes (Signed)
 Pharmacy Medication Assistance Program Note    07/03/2024  Patient ID: Stacey Smith, female   DOB: March 18, 1951, 73 y.o.   MRN: [id number]     07/03/2024  Outreach Medication One  Manufacturer Medication One Novo Nordisk  Nordisk Drugs Ozempic   Dose of Ozempic  1mg   Type of Journalist, Newspaper to [employer]    RENEWAL - submitted online.   Faxed provider pages to Novo nordisk 07/11/24

## 2024-08-04 ENCOUNTER — Ambulatory Visit (HOSPITAL_COMMUNITY)
Admission: RE | Admit: 2024-08-04 | Discharge: 2024-08-04 | Disposition: A | Discharge status: Home / Self Care | Payer: Medicare HMO | Source: Ambulatory Visit | Attending: Family Medicine | Admitting: Family Medicine

## 2024-08-04 DIAGNOSIS — Z780 Asymptomatic menopausal state: Secondary | ICD-10-CM | POA: Insufficient documentation

## 2024-08-04 DIAGNOSIS — Z1231 Encounter for screening mammogram for malignant neoplasm of breast: Secondary | ICD-10-CM | POA: Diagnosis not present

## 2024-08-05 ENCOUNTER — Ambulatory Visit: Payer: Self-pay | Admitting: Family Medicine

## 2024-10-08 ENCOUNTER — Ambulatory Visit: Admitting: Family Medicine

## 2024-10-15 ENCOUNTER — Ambulatory Visit: Payer: Medicare HMO

## 2024-10-15 VITALS — Ht 63.0 in | Wt 172.0 lb

## 2024-10-15 DIAGNOSIS — Z0000 Encounter for general adult medical examination without abnormal findings: Secondary | ICD-10-CM | POA: Diagnosis not present

## 2024-10-15 NOTE — Patient Instructions (Signed)
 Ms. Dino,  Thank you for taking the time for your Medicare Wellness Visit. I appreciate your continued commitment to your health goals. Please review the care plan we discussed, and feel free to reach out if I can assist you further.  Medicare recommends these wellness visits once per year to help you and your care team stay ahead of potential health issues. These visits are designed to focus on prevention, allowing your provider to concentrate on managing your acute and chronic conditions during your regular appointments.  Please note that Annual Wellness Visits do not include a physical exam. Some assessments may be limited, especially if the visit was conducted virtually. If needed, we may recommend a separate in-person follow-up with your provider.  Wishing you excellent health and many blessings in the year to come!  -Manley Fason, CMA  Ongoing Care Seeing your primary care provider every 3 to 6 months helps us  monitor your health and provide consistent, personalized care.   Recommended Screenings:  Health Maintenance  Topic Date Due   DTaP/Tdap/Td vaccine (4 - Td or Tdap) 07/01/2024   Eye exam for diabetics  10/24/2024   Hemoglobin A1C  12/04/2024   Complete foot exam   02/09/2025   COVID-19 Vaccine (8 - 2025-26 season) 03/15/2025   Yearly kidney health urinalysis for diabetes  05/30/2025   Yearly kidney function blood test for diabetes  06/04/2025   Breast Cancer Screening  08/04/2025   Medicare Annual Wellness Visit  10/15/2025   Colon Cancer Screening  03/10/2027   DEXA scan (bone density measurement)  08/04/2029   Pneumococcal Vaccine for age over 74  Completed   Flu Shot  Completed   Hepatitis C Screening  Completed   Zoster (Shingles) Vaccine  Completed   Meningitis B Vaccine  Aged Out       10/15/2024   10:48 AM  Advanced Directives  Does Patient Have a Medical Advance Directive? Yes  Type of Estate agent of [place];Living will  Copy of Healthcare  Power of Attorney in Chart? No - copy requested   Advance Care Planning is important because it: Ensures you receive medical care that aligns with your values, goals, and preferences. Provides guidance to your family and loved ones, reducing the emotional burden of decision-making during critical moments.  Vision: Annual vision screenings are recommended for early detection of glaucoma, cataracts, and diabetic retinopathy. These exams can also reveal signs of chronic conditions such as diabetes and high blood pressure.  Dental: Annual dental screenings help detect early signs of oral cancer, gum disease, and other conditions linked to overall health, including heart disease and diabetes.  Please see the attached documents for additional preventive care recommendations.

## 2024-10-15 NOTE — Progress Notes (Signed)
 Please attest and cosign this visit due to patients primary care provider not being immediately available at the time the visit was completed.   Subjective:   Stacey Smith is a 73 y.o. who presents for a Medicare Wellness preventive visit. As a reminder, Annual Wellness Visits don't include a physical exam, and some assessments may be limited, especially if this visit is performed virtually. We may recommend an in-person follow-up visit with your provider if needed.  Visit Complete: Virtual I connected with  Stacey Smith on 10/15/24 by a audio enabled telemedicine application and verified that I am speaking with the correct person using two identifiers.  Patient Location: Home  Provider Location: Office/Clinic  I discussed the limitations of evaluation and management by telemedicine. The patient expressed understanding and agreed to proceed.  Vital Signs: Because this visit was a virtual/telehealth visit, some criteria may be missing or patient reported. Any vitals not documented were not able to be obtained and vitals that have been documented are patient reported. VideoDeclined- This patient declined Librarian, academic. Therefore the visit was completed with audio only.  Persons Participating in Visit: Patient.  AWV Questionnaire: Yes: Patient Medicare AWV questionnaire was completed by the patient on 10/15/2024; I have confirmed that all information answered by patient is correct and no changes since this date. Cardiac Risk Factors include: advanced age (>70men, >58 women);diabetes mellitus;dyslipidemia;hypertension;obesity (BMI >30kg/m2)     Objective:    Today's Vitals   10/15/24 1051  Weight: 172 lb (78 kg)  Height: 5' 3 (1.6 m)  PainSc: 0-No pain   Body mass index is 30.47 kg/m.    10/15/2024   10:48 AM 03/20/2024    1:00 PM 10/15/2023   10:58 AM 09/29/2022    2:53 PM 09/20/2022   10:10 AM 03/09/2022    7:43 AM 08/25/2021    2:20 PM   Advanced Directives  Does Patient Have a Medical Advance Directive? Yes Yes Yes No Yes Yes Yes  Type of Estate agent of [place];Living will Healthcare Power of [place];Living will Healthcare Power of [place];Living will  Healthcare Power of [place];Out of facility DNR (pink MOST or yellow form);Living will Healthcare Power of [place];Living will Healthcare Power of [place];Living will  Does patient want to make changes to medical advance directive?  No - Patient declined       Copy of Healthcare Power of Attorney in Chart? No - copy requested No - copy requested No - copy requested  No - copy requested No - copy requested   Would patient like information on creating a medical advance directive?    No - Patient declined       Current Medications (verified) Outpatient Encounter Medications as of 10/15/2024  Medication Sig   aspirin EC 81 MG tablet Take 1 tablet (81 mg total) by mouth daily. Swallow whole.   atorvastatin  (LIPITOR) 80 MG tablet TAKE 1 TABLET BY MOUTH EVERY DAY   Cholecalciferol (VITAMIN D3) 50 MCG (2000 UT) TABS Take 2,000 Units by mouth daily.   famotidine  (PEPCID ) 40 MG tablet TAKE 1 TABLET BY MOUTH EVERY DAY AS NEEDED FOR HEARTBURN   fluticasone  (FLONASE ) 50 MCG/ACT nasal spray Place 2 sprays into both nostrils daily as needed. For allergies   ibuprofen  (ADVIL ) 800 MG tablet Take 1 tablet (800 mg total) by mouth every 8 (eight) hours as needed.   Lancets (ONETOUCH DELICA PLUS LANCET30G) MISC USE AS DIRECTED   olmesartan  (BENICAR ) 20 MG tablet TAKE 1  TABLET BY MOUTH EVERY DAY   ONETOUCH VERIO test strip Use as instructed twice daily dx e11.65   Semaglutide , 1 MG/DOSE, 4 MG/3ML SOPN Inject 1 mg as directed once a week.   No facility-administered encounter medications on file as of 10/15/2024.    Allergies (verified) Metformin  and related and Tessalon  perles [benzonatate ]   History: Past Medical History:  Diagnosis Date   Allergy    seasonal  intermittently   Anemia 06/04/2024   Arthritis    spine   Back pain    Colon polyps    Diabetes mellitus without complication (HCC)    GERD (gastroesophageal reflux disease)    Headache(784.0)    Hyperlipidemia    well controlled   Obesity    S/P colonoscopy 09/2006   Normal rectum, diminutive polyps in the left colon cold: TUBULAR ADENOMA   TMJ (dislocation of temporomandibular joint)    Past Surgical History:  Procedure Laterality Date   ABDOMINAL HYSTERECTOMY N/A    Phreesia 03/04/2021   COLONOSCOPY  10/03/2011   Procedure: COLONOSCOPY;  Surgeon: Lamar CHRISTELLA Hollingshead, MD;  [facility];  Service: Endoscopy;  Laterality: N/A;  10:30   COLONOSCOPY N/A 11/01/2016   rourk: Diverticulosis, multiple colon polyps removed, tubular adenomas.  Next colonoscopy in November 2022.   COLONOSCOPY WITH PROPOFOL  N/A 03/09/2022   Procedure: COLONOSCOPY WITH PROPOFOL ;  Surgeon: Hollingshead Lamar CHRISTELLA, MD;  [facility];  Service: Endoscopy;  Laterality: N/A;  9:00 / ASA 2   CORONARY CT ANGIOGRAM  12/2018   Coronary calcium  score of 0. This was 0 percentile for age & sex matched control. Normal coronary origin with right dominance. No evidence of CAD.    excision of cyst for thyroid  gland non cancer  1988   POLYPECTOMY  11/01/2016   Procedure: POLYPECTOMY;  Surgeon: Lamar CHRISTELLA Hollingshead, MD;  [facility];  Service: Endoscopy;;  colon    POLYPECTOMY  03/09/2022   Procedure: POLYPECTOMY;  Surgeon: Hollingshead Lamar CHRISTELLA, MD;  [facility];  Service: Endoscopy;;   TMJ ARTHROPLASTY  09/23/2012   Procedure: TEMPOROMANDIBULAR JOINT (TMJ) ARTHROPLASTY;  Surgeon: Glendia CHRISTELLA Primrose, DDS;  [facility];  Service: Oral Surgery;  Laterality: Left;  Temporomandibular joint arthrotomy and meniscectomy   TONSILLECTOMY  1960   TOTAL ABDOMINAL HYSTERECTOMY  1985   right ovary fibroids    TUBAL LIGATION N/A    Phreesia 03/04/2021   Family History  Problem Relation Age of Onset   Stroke  Mother    Hypertension Mother    Cancer Mother        breast    Glaucoma Mother    Asthma Mother    Coronary artery disease Father    Diabetes Father    Heart failure Father    Kidney disease Father    Early death Brother    Diabetes Sister    Hypertension Sister    Hypertension Sister    Diabetes Sister    Depression Sister    Diabetes Sister    Depression Sister    Colon cancer Sister    Cancer - Colon Sister        colon    Cancer Sister    Thyroid  disease Sister    Bipolar disorder Sister    Social History   Socioeconomic History   Marital status: Divorced    Spouse name: Not on file   Number of children: 2   Years of education: Not on file  Highest education level: 12th grade  Occupational History   Occupation: Engineer, manufacturing systems: [employer]    Comment: [place]  Tobacco Use   Smoking status: Never   Smokeless tobacco: Never  Vaping Use   Vaping status: Never Used  Substance and Sexual Activity   Alcohol use: Yes    Alcohol/week: 2.0 standard drinks of alcohol    Types: 2 Shots of liquor per week    Comment: Occasionally   Drug use: No   Sexual activity: Not Currently    Birth control/protection: None  Other Topics Concern   Not on file  Social History Narrative   Recently lost her long-term partner (2019)   Social Drivers of Corporate investment banker Strain: Low Risk  (10/15/2024)   Overall Financial Resource Strain (CARDIA)    Difficulty of Paying Living Expenses: Not hard at all  Food Insecurity: No Food Insecurity (10/15/2024)   Hunger Vital Sign    Worried About Running Out of Food in the Last Year: Never true    Ran Out of Food in the Last Year: Never true  Transportation Needs: No Transportation Needs (10/15/2024)   PRAPARE - Administrator, Civil Service (Medical): No    Lack of Transportation (Non-Medical): No  Physical Activity: Insufficiently Active (10/15/2024)   Exercise Vital Sign    Days of  Exercise per Week: 3 days    Minutes of Exercise per Session: 30 min  Stress: No Stress Concern Present (10/15/2024)   [employer] of Occupational Health - Occupational Stress Questionnaire    Feeling of Stress: Not at all  Social Connections: Moderately Integrated (10/15/2024)   Social Connection and Isolation Panel    Frequency of Communication with Friends and Family: More than three times a week    Frequency of Social Gatherings with Friends and Family: More than three times a week    Attends Religious Services: More than 4 times per year    Active Member of [employer] or Organizations: Yes    Attends Engineer, structural: More than 4 times per year    Marital Status: Divorced    Tobacco Counseling Counseling given: Yes   Clinical Intake: Pre-visit preparation completed: Yes Pain : No/denies pain Pain Score: 0-No pain   BMI - recorded: 30.47 Nutritional Status: BMI > 30  Obese Nutritional Risks: None Diabetes: Yes CBG done?: No (telehealth visit) Did pt. bring in CBG monitor from home?: No Lab Results  Component Value Date   HGBA1C 6.4 (H) 06/04/2024   HGBA1C 6.5 (H) 02/08/2024   HGBA1C 6.7 (H) 09/04/2023    How often do you need to have someone help you when you read instructions, pamphlets, or other written materials from your doctor or pharmacy?: 1 - Never Interpreter Needed?: No Information entered by :: Aerie Donica W CMA (AAMA)  Activities of Daily Living     10/15/2024    6:17 AM 03/20/2024   12:58 PM  In your present state of health, do you have any difficulty performing the following activities:  Hearing? 0 0  Vision? 0 0  Difficulty concentrating or making decisions? 0 0  Walking or climbing stairs? 0   Dressing or bathing? 0   Doing errands, shopping? 0   Preparing Food and eating ? N   Using the Toilet? N   In the past six months, have you accidently leaked urine? Y   Do you have problems with loss of bowel control? N  Managing your  Medications? N   Managing your Finances? N   Housekeeping or managing your Housekeeping? N    Patient Care Team: Antonetta Rollene BRAVO, MD as PCP - General Anner Alm ORN, MD as PCP - Cardiology (Cardiology) Shaaron Lamar HERO, MD (Gastroenterology) Jacinto Lonni PARAS, [facility] (Inactive) (Pharmacist) [facility]  I have updated your Care Teams any recent Medical Services you may have received from other providers in the past year.     Assessment:   This is a routine wellness examination for Stacey Smith.  Hearing/Vision screen Hearing Screening - Comments:: Patient denies any hearing difficulties.   Vision Screening - Comments:: Wears rx glasses - up to date with routine eye exams with  [facility]   Goals Addressed               This Visit's Progress     I want to remain active and healthy (pt-stated)          Depression Screen     10/15/2024   10:59 AM 06/04/2024    1:12 PM 02/06/2024    1:09 PM 10/15/2023   11:01 AM 09/04/2023   10:56 AM 05/02/2023    2:41 PM 02/07/2023    4:21 PM  PHQ 2/9 Scores  PHQ - 2 Score 0 0 0 0 0 0 0  PHQ- 9 Score 0 2  0 1 1 3      Fall Risk     10/15/2024    6:17 AM 06/04/2024    1:12 PM 02/06/2024    1:09 PM 10/11/2023    2:32 PM 09/04/2023   11:00 AM  Fall Risk   Falls in the past year? 1 1 0 0 0  Number falls in past yr: 0 0 0 0   Injury with Fall? 1 0 0 1   Risk for fall due to :   No Fall Risks No Fall Risks   Follow up  Falls evaluation completed Falls evaluation completed Falls prevention discussed     MEDICARE RISK AT HOME:  Medicare Risk at Home Any stairs in or around the home?: (Patient-Rptd) No If so, are there any without handrails?: (Patient-Rptd) No Home free of loose throw rugs in walkways, pet beds, electrical cords, etc?: (Patient-Rptd) Yes Adequate lighting in your home to reduce risk of falls?: (Patient-Rptd) Yes Life alert?: (Patient-Rptd) No Use of a  cane, walker or w/c?: (Patient-Rptd) No Grab bars in the bathroom?: (Patient-Rptd) Yes Shower chair or bench in shower?: (Patient-Rptd) Yes Elevated toilet seat or a handicapped toilet?: (Patient-Rptd) No  TIMED UP AND GO: Was the test performed?  No  Cognitive Function: 6CIT completed        10/15/2024   10:54 AM 10/15/2023   11:00 AM 09/20/2022   10:14 AM 08/25/2021    2:22 PM 08/19/2020   10:14 AM  6CIT Screen  What Year? 0 points 0 points 0 points 0 points 0 points  What month? 0 points 0 points 0 points 0 points 0 points  What time? 0 points 0 points 0 points 0 points 0 points  Count back from 20 0 points 0 points 0 points 0 points 0 points  Months in reverse 0 points 0 points 0 points 0 points 0 points  Repeat phrase 0 points 0 points 0 points 0 points 0 points  Total Score 0 points 0 points 0 points 0 points 0 points    Immunizations Immunization History  Administered  Date(s) Administered   Fluad Quad(high Dose 65+) 09/08/2019, 10/04/2021, 09/08/2022   Fluad Trivalent(High Dose 65+) 09/04/2023   INFLUENZA, HIGH DOSE SEASONAL PF 11/06/2018   Influenza Split 11/23/2011, 10/23/2014   Influenza Whole 01/30/2008, 09/27/2010, 09/24/2012   Influenza,inj,Quad PF,6+ Mos 11/03/2014, 09/10/2015, 08/25/2016, 11/05/2017, 08/19/2020   Influenza,inj,quad, With Preservative 10/25/2017   PFIZER Comirnaty(Gray Top)Covid-19 Tri-Sucrose Vaccine 05/10/2021   PFIZER(Purple Top)SARS-COV-2 Vaccination 01/22/2020, 02/13/2020, 09/20/2020   Pfizer Covid-19 Vaccine Bivalent Booster 74yrs & up 10/04/2021   Pfizer(Comirnaty)Fall Seasonal Vaccine 12 years and older 10/17/2022, 09/15/2024   Pneumococcal Conjugate PCV 7 10/25/2017   Pneumococcal Conjugate-13 03/04/2015, 10/25/2017, 11/01/2017   Pneumococcal Polysaccharide-23 11/25/2018   Pneumococcal-Unspecified 10/25/2017   Td 05/24/2004   Td (Adult), 2 Lf Tetanus Toxid, Preservative Free 05/24/2004   Tdap 07/01/2014   Zoster  Recombinant(Shingrix) 10/25/2021, 01/26/2022   Zoster, Live 01/16/2013    Screening Tests Health Maintenance  Topic Date Due   DTaP/Tdap/Td (4 - Td or Tdap) 07/01/2024   OPHTHALMOLOGY EXAM  10/24/2024   HEMOGLOBIN A1C  12/04/2024   FOOT EXAM  02/09/2025   COVID-19 Vaccine (8 - 2025-26 season) 03/15/2025   Diabetic kidney evaluation - Urine ACR  05/30/2025   Diabetic kidney evaluation - eGFR measurement  06/04/2025   Mammogram  08/04/2025   Medicare Annual Wellness (AWV)  10/15/2025   Colonoscopy  03/10/2027   DEXA SCAN  08/04/2029   Pneumococcal Vaccine: 50+ Years  Completed   Influenza Vaccine  Completed   Hepatitis C Screening  Completed   Zoster Vaccines- Shingrix  Completed   Meningococcal B Vaccine  Aged Out    Health Maintenance Health Maintenance Due  Topic Date Due   DTaP/Tdap/Td (4 - Td or Tdap) 07/01/2024   Health Maintenance Items Addressed: Routine health screenings, other than vaccines, are up to date. Patient is aware of any vaccines that are due and where they can have those done at.  Additional Screening: Vision Screening: Recommended annual ophthalmology exams for early detection of glaucoma and other disorders of the eye. Would you like a referral to an eye doctor? No    Dental Screening: Recommended annual dental exams for proper oral hygiene  Community Resource Referral / Chronic Care Management: CRR required this visit?  No   CCM required this visit?  No  Plan:   I have personally reviewed and noted the following in the patient's chart:   Medical and social history Use of alcohol, tobacco or illicit drugs  Current medications and supplements including opioid prescriptions. Patient is not currently taking opioid prescriptions. Functional ability and status Nutritional status Physical activity Advanced directives List of other physicians Hospitalizations, surgeries, and ER visits in previous 12 months Vitals Screenings to include cognitive,  depression, and falls Referrals and appointments  In addition, I have reviewed and discussed with patient certain preventive protocols, quality metrics, and best practice recommendations. A written personalized care plan for preventive services as well as general preventive health recommendations were provided to patient.   Sivan Cuello, CMA   10/15/2024   After Visit Summary: (MyChart) Due to this being a telephonic visit, the after visit summary with patients personalized plan was offered to patient via MyChart   Notes: Nothing significant to report at this time.

## 2024-11-18 ENCOUNTER — Encounter: Payer: Self-pay | Admitting: Gastroenterology

## 2024-11-21 NOTE — Progress Notes (Signed)
 Pharmacy Medication Assistance Program Note    11/21/2024  Patient ID: Stacey Smith, female   DOB: Jun 29, 1951, 73 y.o.   MRN: [id number]     07/03/2024  Outreach Medication One  Manufacturer Medication One Novo Nordisk  [facility] Drugs Ozempic   Dose of Ozempic  1mg   Type of Journalist, Newspaper to [employer]  Patient Assistance Determination Approved  Approval End Date 12/24/2024

## 2024-12-29 ENCOUNTER — Telehealth: Payer: Self-pay | Admitting: Family Medicine

## 2024-12-29 NOTE — Telephone Encounter (Signed)
 Patient would like to update her pharmacy to [facility]  [address] [place], [place] [address] and would like a refill for her ibuprofen  800 sent to new pharmacy [facility] [address] also states her perscrpition [facility] (statin)needs to be resent to the [facility] location

## 2025-01-01 ENCOUNTER — Other Ambulatory Visit: Payer: Self-pay | Admitting: Family Medicine

## 2025-01-13 ENCOUNTER — Telehealth: Payer: Self-pay | Admitting: Family Medicine

## 2025-01-13 ENCOUNTER — Other Ambulatory Visit: Payer: Self-pay

## 2025-01-13 DIAGNOSIS — E1169 Type 2 diabetes mellitus with other specified complication: Secondary | ICD-10-CM

## 2025-01-13 DIAGNOSIS — I10 Essential (primary) hypertension: Secondary | ICD-10-CM

## 2025-01-13 DIAGNOSIS — E559 Vitamin D deficiency, unspecified: Secondary | ICD-10-CM

## 2025-01-13 MED ORDER — SEMAGLUTIDE (1 MG/DOSE) 4 MG/3ML ~~LOC~~ SOPN: 1.0000 mg | PEN_INJECTOR | SUBCUTANEOUS | 0 refills | Status: DC

## 2025-01-13 MED ORDER — IBUPROFEN 800 MG PO TABS: 800.0000 mg | ORAL_TABLET | Freq: Three times a day (TID) | ORAL | 1 refills | Status: AC | PRN

## 2025-01-13 NOTE — Telephone Encounter (Signed)
 Copied from CRM [id number]. Topic: Clinical - Medication Refill >> [name] 13, 2025  2:14 PM Tiffini S wrote: Medication:  ibuprofen  (ADVIL ) 800 MG tablet,  Semaglutide , 1 MG/DOSE, 4 MG/3ML SOPN   Has the patient contacted their pharmacy? Yes (Agent: If no, request that the patient contact the pharmacy for the refill. If patient does not wish to contact the pharmacy document the reason why and proceed with request.) (Agent: If yes, when and what did the pharmacy advise?)  This is the patient's preferred pharmacy:  [id number] - [place], [place] - [address] AT [facility] [name] [name] [address] Phone: [phone] Fax: [phone]   Is this the correct pharmacy for this prescription? Yes If no, delete pharmacy and type the correct one.   Has the prescription been filled recently? Yes  Is the patient out of the medication? Yes  Has the patient been seen for an appointment in the last year OR does the patient have an upcoming appointment? Yes  Can we respond through MyChart? Yes  Agent: Please be advised that Rx refills may take up to 3 business days. We ask that you follow-up with your pharmacy.

## 2025-01-13 NOTE — Telephone Encounter (Signed)
 Pt informed

## 2025-01-15 ENCOUNTER — Ambulatory Visit: Payer: Self-pay | Admitting: Family Medicine

## 2025-01-15 ENCOUNTER — Telehealth: Payer: Self-pay | Admitting: Pharmacy Technician

## 2025-01-15 ENCOUNTER — Other Ambulatory Visit (HOSPITAL_COMMUNITY): Payer: Self-pay

## 2025-01-15 LAB — CBC WITH DIFFERENTIAL/PLATELET
Basophils Absolute: 0.1 x10E3/uL (ref 0.0–0.2)
Basos: 1 %
EOS (ABSOLUTE): 0.3 x10E3/uL (ref 0.0–0.4)
Eos: 4 %
Hematocrit: 38.6 % (ref 34.0–46.6)
Hemoglobin: 11.5 g/dL (ref 11.1–15.9)
Immature Grans (Abs): 0 x10E3/uL (ref 0.0–0.1)
Immature Granulocytes: 0 %
Lymphocytes Absolute: 2.5 x10E3/uL (ref 0.7–3.1)
Lymphs: 35 %
MCH: 23.5 pg — ABNORMAL LOW (ref 26.6–33.0)
MCHC: 29.8 g/dL — ABNORMAL LOW (ref 31.5–35.7)
MCV: 79 fL (ref 79–97)
Monocytes Absolute: 0.6 x10E3/uL (ref 0.1–0.9)
Monocytes: 9 %
Neutrophils Absolute: 3.6 x10E3/uL (ref 1.4–7.0)
Neutrophils: 51 %
Platelets: 281 x10E3/uL (ref 150–450)
RBC: 4.89 x10E6/uL (ref 3.77–5.28)
RDW: 15.5 % — ABNORMAL HIGH (ref 11.7–15.4)
WBC: 7.1 x10E3/uL (ref 3.4–10.8)

## 2025-01-15 LAB — CMP14+EGFR
ALT: 14 IU/L (ref 0–32)
AST: 17 IU/L (ref 0–40)
Albumin: 4.2 g/dL (ref 3.8–4.8)
Alkaline Phosphatase: 160 IU/L — ABNORMAL HIGH (ref 49–135)
BUN/Creatinine Ratio: 22 (ref 12–28)
BUN: 19 mg/dL (ref 8–27)
Bilirubin Total: 1 mg/dL (ref 0.0–1.2)
CO2: 22 mmol/L (ref 20–29)
Calcium: 9.4 mg/dL (ref 8.7–10.3)
Chloride: 105 mmol/L (ref 96–106)
Creatinine, Ser: 0.85 mg/dL (ref 0.57–1.00)
Globulin, Total: 2.6 g/dL (ref 1.5–4.5)
Glucose: 94 mg/dL (ref 70–99)
Potassium: 4 mmol/L (ref 3.5–5.2)
Sodium: 142 mmol/L (ref 134–144)
Total Protein: 6.8 g/dL (ref 6.0–8.5)
eGFR: 73 mL/min/1.73

## 2025-01-15 LAB — HEMOGLOBIN A1C
Est. average glucose Bld gHb Est-mCnc: 126 mg/dL
Hgb A1c MFr Bld: 6 % — ABNORMAL HIGH (ref 4.8–5.6)

## 2025-01-15 LAB — LIPID PANEL
Chol/HDL Ratio: 2.7 ratio (ref 0.0–4.4)
Cholesterol, Total: 150 mg/dL (ref 100–199)
HDL: 56 mg/dL
LDL Chol Calc (NIH): 72 mg/dL (ref 0–99)
Triglycerides: 123 mg/dL (ref 0–149)
VLDL Cholesterol Cal: 22 mg/dL (ref 5–40)

## 2025-01-15 LAB — VITAMIN D 25 HYDROXY (VIT D DEFICIENCY, FRACTURES): Vit D, 25-Hydroxy: 24.7 ng/mL — ABNORMAL LOW (ref 30.0–100.0)

## 2025-01-15 LAB — TSH: TSH: 1.62 u[IU]/mL (ref 0.450–4.500)

## 2025-01-15 NOTE — Telephone Encounter (Signed)
 Pharmacy Patient Advocate Encounter - (has been getting through Medication Assistance)   Received notification from Onbase CMM KEY that prior authorization for Ozempic  1mg  is required/requested.   Insurance verification completed.   The patient is insured through [employer].   Per test claim: PA required; PA submitted to above mentioned insurance via Latent Key/confirmation #/EOC [id number] Status is pending

## 2025-01-19 ENCOUNTER — Other Ambulatory Visit (HOSPITAL_COMMUNITY): Payer: Self-pay

## 2025-01-19 NOTE — Telephone Encounter (Signed)
 Pharmacy Patient Advocate Encounter  Received notification from [facility] Medicare that Prior Authorization for Ozempic  1mg  has been APPROVED from 1/126 to 01/15/26. Ran test claim, Copay is $47. This test claim was processed through [facility]- copay amounts may vary at other pharmacies due to pharmacy/plan contracts, or as the patient moves through the different stages of their insurance plan.   PA #/Case ID/Reference #: [id number]

## 2025-01-20 NOTE — Progress Notes (Addendum)
 Stacey Smith                                          MRN: [id number]   01/20/2025   The VBCI Quality Team Specialist reviewed this patient medical record for the purposes of chart review for care gap closure. The following were reviewed: chart review for care gap closure-diabetic eye exam.CHECKED TWICE    VBCI Quality Team

## 2025-01-23 ENCOUNTER — Ambulatory Visit: Payer: Medicare (Managed Care) | Admitting: Family Medicine

## 2025-01-23 ENCOUNTER — Other Ambulatory Visit: Payer: Self-pay | Admitting: Family Medicine

## 2025-01-23 ENCOUNTER — Encounter: Payer: Self-pay | Admitting: Family Medicine

## 2025-01-23 ENCOUNTER — Other Ambulatory Visit (HOSPITAL_COMMUNITY): Payer: Self-pay | Admitting: Family Medicine

## 2025-01-23 VITALS — BP 131/79 | HR 75 | Ht 63.0 in | Wt 179.0 lb

## 2025-01-23 DIAGNOSIS — E66811 Obesity, class 1: Secondary | ICD-10-CM

## 2025-01-23 DIAGNOSIS — Z7985 Long-term (current) use of injectable non-insulin antidiabetic drugs: Secondary | ICD-10-CM | POA: Diagnosis not present

## 2025-01-23 DIAGNOSIS — K8020 Calculus of gallbladder without cholecystitis without obstruction: Secondary | ICD-10-CM | POA: Diagnosis not present

## 2025-01-23 DIAGNOSIS — E1169 Type 2 diabetes mellitus with other specified complication: Secondary | ICD-10-CM

## 2025-01-23 DIAGNOSIS — Z9189 Other specified personal risk factors, not elsewhere classified: Secondary | ICD-10-CM

## 2025-01-23 DIAGNOSIS — Z1231 Encounter for screening mammogram for malignant neoplasm of breast: Secondary | ICD-10-CM

## 2025-01-23 DIAGNOSIS — E785 Hyperlipidemia, unspecified: Secondary | ICD-10-CM | POA: Insufficient documentation

## 2025-01-23 DIAGNOSIS — I10 Essential (primary) hypertension: Secondary | ICD-10-CM | POA: Diagnosis not present

## 2025-01-23 DIAGNOSIS — E8881 Metabolic syndrome: Secondary | ICD-10-CM

## 2025-01-23 MED ORDER — SEMAGLUTIDE (2 MG/DOSE) 8 MG/3ML ~~LOC~~ SOPN: 2.0000 mg | PEN_INJECTOR | SUBCUTANEOUS | 6 refills | Status: AC

## 2025-01-23 NOTE — Assessment & Plan Note (Addendum)
 Check coronary calcium  score, multiple co morbidities which increase risk of CAD

## 2025-01-23 NOTE — Patient Instructions (Addendum)
 Annual exam in 6 months  Nurse please send for eye exam done 2025 [place]   Please schedule mammogram at checkout  Increased dose of ozempic  to 2 mg  per week  You need TdAP speak with pharmacist  You are referred for coronary calcium  screen, radiology will call with appointment  Increase vit D3 to 5000 units daily  Urine ACR, cmp and eGFr and hBA1C 3 to 5 days before next appointment  Please call and schedule GI follow up visit  It is important that you exercise regularly at least 30 minutes 5 times a week. If you develop chest pain, have severe difficulty breathing, or feel very tired, stop exercising immediately and seek medical attention    Thanks for choosing [facility], we consider it a privelige to serve you.

## 2025-01-23 NOTE — Assessment & Plan Note (Signed)
" °  Patient re-educated about  the importance of commitment to a  minimum of 150 minutes of exercise per week as able.  The importance of healthy food choices with portion control discussed, as well as eating regularly and within a 12 hour window most days. The need to choose clean , green food 50 to 75% of the time is discussed, as well as to make water  the primary drink and set a goal of 64 ounces water  daily.       01/23/2025    1:35 PM 10/15/2024   10:51 AM 06/16/2024   10:37 AM  Weight /BMI  Weight 179 lb 172 lb 180 lb 3.2 oz  Height 5' 3 (1.6 m) 5' 3 (1.6 m) 5' 3 (1.6 m)  BMI 31.71 kg/m2 30.47 kg/m2 31.92 kg/m2    Deteriorated, inc dose of ozempic  "

## 2025-01-23 NOTE — Assessment & Plan Note (Signed)
 The increased risk of cardiovascular disease associated with this diagnosis, and the need to consistently work on lifestyle to change this is discussed. Following  a  heart healthy diet ,commitment to 30 minutes of exercise at least 5 days per week, as well as control of blood sugar and cholesterol , and achieving a healthy weight are all the areas to be addressed .

## 2025-01-23 NOTE — Assessment & Plan Note (Signed)
 Hyperlipidemia:Low fat diet discussed and encouraged.   Lipid Panel  Lab Results  Component Value Date   CHOL 150 01/14/2025   HDL 56 01/14/2025   LDLCALC 72 01/14/2025   TRIG 123 01/14/2025   CHOLHDL 2.7 01/14/2025     Controlled, no change in medication

## 2025-01-23 NOTE — Progress Notes (Signed)
 "  Stacey Smith     MRN: [id number]      DOB: Feb 04, 1951  Chief Complaint  Patient presents with   Medical Management of Chronic Issues    Follow up     HPI Stacey Smith is here for follow up and re-evaluation of chronic medical conditions, medication management and review of any available recent lab and radiology data.  Preventive health is updated, specifically  Cancer screening and Immunization.   Questions or concerns regarding consultations or procedures which the PT has had in the interim are  addressed. The PT denies any adverse reactions to current medications since the last visit.  There are no new concerns. Requesting increased dose ozempic , to help with weight loss, not exercising There are no specific complaints   ROS Denies recent fever or chills. Denies sinus pressure, nasal congestion, ear pain or sore throat. Denies chest congestion, productive cough or wheezing. Denies chest pains, palpitations and leg swelling Denies abdominal pain, nausea, vomiting,diarrhea or constipation.   Denies dysuria, frequency, hesitancy or incontinence. Denies joint pain, swelling and limitation in mobility. Denies headaches, seizures, numbness, or tingling. Denies depression, anxiety or insomnia. Denies skin break down or rash.   PE  BP 131/79   Pulse 75   Ht 5' 3 (1.6 m)   Wt 179 lb (81.2 kg)   SpO2 94%   BMI 31.71 kg/m   Patient alert and oriented and in no cardiopulmonary distress.  HEENT: No facial asymmetry, EOMI,     Neck supple .  Chest: Clear to auscultation bilaterally.  CVS: S1, S2 no murmurs, no S3.Regular rate.  ABD: Soft non tender.   Ext: No edema  MS: Adequate ROM spine, shoulders, hips and knees.  Skin: Intact, no ulcerations or rash noted.  Psych: Good eye contact, normal affect. Memory intact not anxious or depressed appearing.  CNS: CN 2-12 intact, power,  normal throughout.no focal deficits noted.   Assessment & Plan  Type 2 diabetes  mellitus with other specified complication (HCC) Improved, inc ozempic  dose  Diabetes associated with hypertension, hyperlipidemia, and obesity  Stacey Smith is reminded of the importance of commitment to daily physical activity for 30 minutes or more, as able and the need to limit carbohydrate intake to 30 to 60 grams per meal to help with blood sugar control.   The need to take medication as prescribed, test blood sugar as directed, and to call between visits if there is a concern that blood sugar is uncontrolled is also discussed.   Stacey Smith is reminded of the importance of daily foot exam, annual eye examination, and good blood sugar, blood pressure and cholesterol control.     Latest Ref Rng & Units 01/14/2025   10:08 AM 06/04/2024    1:50 PM 05/30/2024    8:47 AM 03/20/2024   12:45 PM 02/08/2024   10:00 AM  Diabetic Labs  HbA1c 4.8 - 5.6 % 6.0  6.4    6.5   Micro/Creat Ratio 0 - 29 mg/g creat   5     Chol 100 - 199 mg/dL 849     886   HDL >60 mg/dL 56     53   Calc LDL 0 - 99 mg/dL 72     41   Triglycerides 0 - 149 mg/dL 876     899   Creatinine 0.57 - 1.00 mg/dL 9.14  9.05   9.29  9.06       01/23/2025    1:35 PM  10/15/2024   10:51 AM 06/16/2024   10:37 AM 06/04/2024    1:29 PM 06/04/2024    1:10 PM 03/20/2024    4:20 PM 03/20/2024    3:50 PM  BP/Weight  Systolic BP 131 -- 132 110 106 121 112  Diastolic BP 79 -- 74 70 75 80 70  Wt. (Lbs) 179 172 180.2  174    BMI 31.71 kg/m2 30.47 kg/m2 31.92 kg/m2  30.82 kg/m2        Latest Ref Rng & Units 10/25/2023   12:00 AM 12/08/2020   10:20 AM  Foot/eye exam completion dates  Eye Exam No Retinopathy No Retinopathy       Foot Form Completion   Done     This result is from an external source.        At risk for cardiovascular event Check coronary calcium  score  Hyperlipidemia LDL goal <100 Hyperlipidemia:Low fat diet discussed and encouraged.   Lipid Panel  Lab Results  Component Value Date   CHOL 150 01/14/2025   HDL  56 01/14/2025   LDLCALC 72 01/14/2025   TRIG 123 01/14/2025   CHOLHDL 2.7 01/14/2025     Controlled, no change in medication  "

## 2025-01-23 NOTE — Assessment & Plan Note (Signed)
-  asymptomatic

## 2025-01-23 NOTE — Assessment & Plan Note (Signed)
 Controlled, no change in medication DASH diet and commitment to daily physical activity for a minimum of 30 minutes discussed and encouraged, as a part of hypertension management. The importance of attaining a healthy weight is also discussed.     01/23/2025    1:35 PM 10/15/2024   10:51 AM 06/16/2024   10:37 AM 06/04/2024    1:29 PM 06/04/2024    1:10 PM 03/20/2024    4:20 PM 03/20/2024    3:50 PM  BP/Weight  Systolic BP 131 -- 132 110 106 121 112  Diastolic BP 79 -- 74 70 75 80 70  Wt. (Lbs) 179 172 180.2  174    BMI 31.71 kg/m2 30.47 kg/m2 31.92 kg/m2  30.82 kg/m2

## 2025-01-23 NOTE — Assessment & Plan Note (Signed)
 Improved, inc ozempic  dose  Diabetes associated with hypertension, hyperlipidemia, and obesity  Stacey Smith is reminded of the importance of commitment to daily physical activity for 30 minutes or more, as able and the need to limit carbohydrate intake to 30 to 60 grams per meal to help with blood sugar control.   The need to take medication as prescribed, test blood sugar as directed, and to call between visits if there is a concern that blood sugar is uncontrolled is also discussed.   Stacey Smith is reminded of the importance of daily foot exam, annual eye examination, and good blood sugar, blood pressure and cholesterol control.     Latest Ref Rng & Units 01/14/2025   10:08 AM 06/04/2024    1:50 PM 05/30/2024    8:47 AM 03/20/2024   12:45 PM 02/08/2024   10:00 AM  Diabetic Labs  HbA1c 4.8 - 5.6 % 6.0  6.4    6.5   Micro/Creat Ratio 0 - 29 mg/g creat   5     Chol 100 - 199 mg/dL 849     886   HDL >60 mg/dL 56     53   Calc LDL 0 - 99 mg/dL 72     41   Triglycerides 0 - 149 mg/dL 876     899   Creatinine 0.57 - 1.00 mg/dL 9.14  9.05   9.29  9.06       01/23/2025    1:35 PM 10/15/2024   10:51 AM 06/16/2024   10:37 AM 06/04/2024    1:29 PM 06/04/2024    1:10 PM 03/20/2024    4:20 PM 03/20/2024    3:50 PM  BP/Weight  Systolic BP 131 -- 132 110 106 121 112  Diastolic BP 79 -- 74 70 75 80 70  Wt. (Lbs) 179 172 180.2  174    BMI 31.71 kg/m2 30.47 kg/m2 31.92 kg/m2  30.82 kg/m2        Latest Ref Rng & Units 10/25/2023   12:00 AM 12/08/2020   10:20 AM  Foot/eye exam completion dates  Eye Exam No Retinopathy No Retinopathy       Foot Form Completion   Done     This result is from an external source.

## 2025-07-24 ENCOUNTER — Ambulatory Visit: Payer: Self-pay | Admitting: Family Medicine

## 2025-08-05 ENCOUNTER — Ambulatory Visit (HOSPITAL_COMMUNITY): Payer: Medicare (Managed Care)

## 2025-10-19 ENCOUNTER — Ambulatory Visit
